# Patient Record
Sex: Male | Born: 1935 | Race: White | Hispanic: No | Marital: Single | Smoking: Former smoker
Health system: Southern US, Community
[De-identification: ages and names within clinical notes are randomized; demographics above are authoritative.]

## PROBLEM LIST (undated history)

## (undated) DIAGNOSIS — J449 Chronic obstructive pulmonary disease, unspecified: Secondary | ICD-10-CM

## (undated) DIAGNOSIS — F0390 Unspecified dementia without behavioral disturbance: Secondary | ICD-10-CM

## (undated) DIAGNOSIS — Z8711 Personal history of peptic ulcer disease: Secondary | ICD-10-CM

## (undated) DIAGNOSIS — I10 Essential (primary) hypertension: Secondary | ICD-10-CM

## (undated) DIAGNOSIS — D649 Anemia, unspecified: Secondary | ICD-10-CM

## (undated) DIAGNOSIS — Z8719 Personal history of other diseases of the digestive system: Secondary | ICD-10-CM

## (undated) DIAGNOSIS — K219 Gastro-esophageal reflux disease without esophagitis: Secondary | ICD-10-CM

## (undated) DIAGNOSIS — IMO0001 Reserved for inherently not codable concepts without codable children: Secondary | ICD-10-CM

## (undated) HISTORY — PX: GASTRECTOMY: SHX58

---

## 1997-07-07 ENCOUNTER — Encounter: Admission: RE | Admit: 1997-07-07 | Discharge: 1997-07-07 | Payer: Self-pay | Admitting: Sports Medicine

## 1997-07-12 ENCOUNTER — Encounter: Admission: RE | Admit: 1997-07-12 | Discharge: 1997-07-12 | Payer: Self-pay | Admitting: Family Medicine

## 1997-10-09 ENCOUNTER — Emergency Department (HOSPITAL_COMMUNITY): Admission: EM | Admit: 1997-10-09 | Discharge: 1997-10-09 | Payer: Self-pay

## 1997-10-10 ENCOUNTER — Encounter: Admission: RE | Admit: 1997-10-10 | Discharge: 1997-10-10 | Payer: Self-pay | Admitting: Family Medicine

## 1997-10-13 ENCOUNTER — Encounter: Admission: RE | Admit: 1997-10-13 | Discharge: 1997-10-13 | Payer: Self-pay | Admitting: Family Medicine

## 1997-10-17 ENCOUNTER — Encounter: Admission: RE | Admit: 1997-10-17 | Discharge: 1997-10-17 | Payer: Self-pay | Admitting: Family Medicine

## 1997-10-19 ENCOUNTER — Inpatient Hospital Stay (HOSPITAL_COMMUNITY): Admission: EM | Admit: 1997-10-19 | Discharge: 1997-11-24 | Payer: Self-pay | Admitting: Emergency Medicine

## 1997-10-25 ENCOUNTER — Encounter: Payer: Self-pay | Admitting: Critical Care Medicine

## 1997-10-25 ENCOUNTER — Encounter: Admission: RE | Admit: 1997-10-25 | Discharge: 1997-10-25 | Payer: Self-pay | Admitting: Family Medicine

## 1997-10-26 ENCOUNTER — Encounter: Payer: Self-pay | Admitting: Pulmonary Disease

## 1997-10-26 ENCOUNTER — Encounter: Payer: Self-pay | Admitting: Family Medicine

## 1997-10-27 ENCOUNTER — Encounter: Payer: Self-pay | Admitting: Pulmonary Disease

## 1997-10-28 ENCOUNTER — Encounter: Payer: Self-pay | Admitting: Family Medicine

## 1997-10-30 ENCOUNTER — Encounter: Payer: Self-pay | Admitting: Pulmonary Disease

## 1997-11-03 ENCOUNTER — Encounter: Payer: Self-pay | Admitting: Pulmonary Disease

## 1997-11-04 ENCOUNTER — Encounter: Payer: Self-pay | Admitting: Family Medicine

## 1997-11-09 ENCOUNTER — Encounter: Payer: Self-pay | Admitting: Gastroenterology

## 1997-11-15 ENCOUNTER — Encounter: Payer: Self-pay | Admitting: Family Medicine

## 1997-12-28 ENCOUNTER — Encounter: Admission: RE | Admit: 1997-12-28 | Discharge: 1997-12-28 | Payer: Self-pay | Admitting: Family Medicine

## 1998-03-29 ENCOUNTER — Encounter: Admission: RE | Admit: 1998-03-29 | Discharge: 1998-03-29 | Payer: Self-pay | Admitting: Family Medicine

## 1998-05-24 ENCOUNTER — Encounter: Admission: RE | Admit: 1998-05-24 | Discharge: 1998-05-24 | Payer: Self-pay | Admitting: Family Medicine

## 1998-09-06 ENCOUNTER — Encounter: Admission: RE | Admit: 1998-09-06 | Discharge: 1998-09-06 | Payer: Self-pay | Admitting: Family Medicine

## 1998-10-05 ENCOUNTER — Encounter: Admission: RE | Admit: 1998-10-05 | Discharge: 1998-10-05 | Payer: Self-pay | Admitting: Family Medicine

## 1998-10-10 ENCOUNTER — Encounter: Admission: RE | Admit: 1998-10-10 | Discharge: 1998-10-10 | Payer: Self-pay | Admitting: Family Medicine

## 1999-03-16 ENCOUNTER — Encounter: Admission: RE | Admit: 1999-03-16 | Discharge: 1999-03-16 | Payer: Self-pay | Admitting: Family Medicine

## 1999-03-19 ENCOUNTER — Encounter: Admission: RE | Admit: 1999-03-19 | Discharge: 1999-03-19 | Payer: Self-pay | Admitting: Family Medicine

## 1999-03-28 ENCOUNTER — Encounter: Admission: RE | Admit: 1999-03-28 | Discharge: 1999-03-28 | Payer: Self-pay | Admitting: Family Medicine

## 1999-03-30 ENCOUNTER — Encounter: Admission: RE | Admit: 1999-03-30 | Discharge: 1999-03-30 | Payer: Self-pay | Admitting: Family Medicine

## 1999-04-02 ENCOUNTER — Encounter: Admission: RE | Admit: 1999-04-02 | Discharge: 1999-04-02 | Payer: Self-pay | Admitting: Family Medicine

## 1999-10-04 ENCOUNTER — Encounter: Admission: RE | Admit: 1999-10-04 | Discharge: 1999-10-04 | Payer: Self-pay | Admitting: Family Medicine

## 2000-04-15 ENCOUNTER — Encounter: Admission: RE | Admit: 2000-04-15 | Discharge: 2000-04-15 | Payer: Self-pay | Admitting: Family Medicine

## 2000-12-04 ENCOUNTER — Encounter: Admission: RE | Admit: 2000-12-04 | Discharge: 2000-12-04 | Payer: Self-pay | Admitting: Family Medicine

## 2001-04-20 ENCOUNTER — Encounter: Admission: RE | Admit: 2001-04-20 | Discharge: 2001-04-20 | Payer: Self-pay | Admitting: Family Medicine

## 2002-05-27 ENCOUNTER — Encounter: Admission: RE | Admit: 2002-05-27 | Discharge: 2002-05-27 | Payer: Self-pay | Admitting: Family Medicine

## 2002-11-17 ENCOUNTER — Encounter: Admission: RE | Admit: 2002-11-17 | Discharge: 2002-11-17 | Payer: Self-pay | Admitting: Family Medicine

## 2003-03-30 ENCOUNTER — Encounter: Admission: RE | Admit: 2003-03-30 | Discharge: 2003-03-30 | Payer: Self-pay | Admitting: Family Medicine

## 2003-07-10 ENCOUNTER — Inpatient Hospital Stay (HOSPITAL_COMMUNITY): Admission: EM | Admit: 2003-07-10 | Discharge: 2003-07-14 | Payer: Self-pay | Admitting: Emergency Medicine

## 2003-07-27 ENCOUNTER — Encounter: Admission: RE | Admit: 2003-07-27 | Discharge: 2003-07-27 | Payer: Self-pay | Admitting: Family Medicine

## 2003-09-15 ENCOUNTER — Encounter: Admission: RE | Admit: 2003-09-15 | Discharge: 2003-09-15 | Payer: Self-pay | Admitting: Sports Medicine

## 2003-10-19 ENCOUNTER — Ambulatory Visit: Payer: Self-pay | Admitting: Family Medicine

## 2003-12-22 ENCOUNTER — Ambulatory Visit: Payer: Self-pay | Admitting: Family Medicine

## 2004-01-09 ENCOUNTER — Ambulatory Visit: Payer: Self-pay | Admitting: Sports Medicine

## 2004-02-29 ENCOUNTER — Ambulatory Visit: Payer: Self-pay | Admitting: Family Medicine

## 2004-11-12 ENCOUNTER — Ambulatory Visit: Payer: Self-pay | Admitting: Family Medicine

## 2005-02-22 ENCOUNTER — Ambulatory Visit: Payer: Self-pay | Admitting: Family Medicine

## 2005-03-22 ENCOUNTER — Ambulatory Visit: Payer: Self-pay | Admitting: Family Medicine

## 2005-05-22 ENCOUNTER — Encounter: Payer: Self-pay | Admitting: Family Medicine

## 2005-06-06 ENCOUNTER — Ambulatory Visit: Payer: Self-pay | Admitting: Family Medicine

## 2005-06-06 ENCOUNTER — Encounter: Admission: RE | Admit: 2005-06-06 | Discharge: 2005-06-06 | Payer: Self-pay | Admitting: Sports Medicine

## 2005-06-11 ENCOUNTER — Ambulatory Visit: Payer: Self-pay | Admitting: Sports Medicine

## 2005-08-30 ENCOUNTER — Ambulatory Visit: Payer: Self-pay | Admitting: Family Medicine

## 2005-10-14 ENCOUNTER — Emergency Department (HOSPITAL_COMMUNITY): Admission: EM | Admit: 2005-10-14 | Discharge: 2005-10-14 | Payer: Self-pay | Admitting: Emergency Medicine

## 2005-11-12 ENCOUNTER — Ambulatory Visit (HOSPITAL_COMMUNITY): Admission: RE | Admit: 2005-11-12 | Discharge: 2005-11-12 | Payer: Self-pay | Admitting: Family Medicine

## 2005-11-12 ENCOUNTER — Ambulatory Visit: Payer: Self-pay | Admitting: Family Medicine

## 2006-02-14 ENCOUNTER — Emergency Department (HOSPITAL_COMMUNITY): Admission: EM | Admit: 2006-02-14 | Discharge: 2006-02-14 | Payer: Self-pay | Admitting: Emergency Medicine

## 2006-02-21 ENCOUNTER — Ambulatory Visit: Payer: Self-pay | Admitting: Family Medicine

## 2006-02-21 ENCOUNTER — Encounter (INDEPENDENT_AMBULATORY_CARE_PROVIDER_SITE_OTHER): Payer: Self-pay | Admitting: Family Medicine

## 2006-02-21 LAB — CONVERTED CEMR LAB
Ferritin: 6 ng/mL — ABNORMAL LOW (ref 22–322)
Iron: 12 ug/dL — ABNORMAL LOW (ref 42–165)
Saturation Ratios: 4 % — ABNORMAL LOW (ref 20–55)

## 2006-02-27 ENCOUNTER — Encounter: Admission: RE | Admit: 2006-02-27 | Discharge: 2006-02-27 | Payer: Self-pay | Admitting: Gastroenterology

## 2006-03-20 DIAGNOSIS — K449 Diaphragmatic hernia without obstruction or gangrene: Secondary | ICD-10-CM | POA: Insufficient documentation

## 2006-03-20 DIAGNOSIS — J45909 Unspecified asthma, uncomplicated: Secondary | ICD-10-CM | POA: Insufficient documentation

## 2006-03-20 DIAGNOSIS — F29 Unspecified psychosis not due to a substance or known physiological condition: Secondary | ICD-10-CM | POA: Insufficient documentation

## 2006-03-20 DIAGNOSIS — K219 Gastro-esophageal reflux disease without esophagitis: Secondary | ICD-10-CM

## 2006-03-20 DIAGNOSIS — F79 Unspecified intellectual disabilities: Secondary | ICD-10-CM | POA: Insufficient documentation

## 2006-03-20 DIAGNOSIS — I10 Essential (primary) hypertension: Secondary | ICD-10-CM | POA: Insufficient documentation

## 2006-04-25 ENCOUNTER — Inpatient Hospital Stay (HOSPITAL_COMMUNITY): Admission: EM | Admit: 2006-04-25 | Discharge: 2006-04-28 | Payer: Self-pay | Admitting: Emergency Medicine

## 2006-04-25 ENCOUNTER — Telehealth: Payer: Self-pay | Admitting: *Deleted

## 2006-05-02 ENCOUNTER — Telehealth: Payer: Self-pay | Admitting: *Deleted

## 2006-05-05 ENCOUNTER — Encounter: Admission: RE | Admit: 2006-05-05 | Discharge: 2006-05-05 | Payer: Self-pay | Admitting: Sports Medicine

## 2006-05-05 ENCOUNTER — Encounter (INDEPENDENT_AMBULATORY_CARE_PROVIDER_SITE_OTHER): Payer: Self-pay | Admitting: Family Medicine

## 2006-05-05 ENCOUNTER — Ambulatory Visit: Payer: Self-pay | Admitting: Family Medicine

## 2006-05-05 ENCOUNTER — Ambulatory Visit (HOSPITAL_COMMUNITY): Admission: RE | Admit: 2006-05-05 | Discharge: 2006-05-05 | Payer: Self-pay | Admitting: Family Medicine

## 2006-05-05 DIAGNOSIS — E785 Hyperlipidemia, unspecified: Secondary | ICD-10-CM | POA: Insufficient documentation

## 2006-05-05 DIAGNOSIS — E876 Hypokalemia: Secondary | ICD-10-CM

## 2006-05-05 DIAGNOSIS — D509 Iron deficiency anemia, unspecified: Secondary | ICD-10-CM

## 2006-05-05 LAB — CONVERTED CEMR LAB
BUN: 13 mg/dL (ref 6–23)
CO2: 23 meq/L (ref 19–32)
Calcium: 9.3 mg/dL (ref 8.4–10.5)
Creatinine, Ser: 1.1 mg/dL (ref 0.40–1.50)
Glucose, Bld: 154 mg/dL — ABNORMAL HIGH (ref 70–99)

## 2006-05-06 ENCOUNTER — Telehealth (INDEPENDENT_AMBULATORY_CARE_PROVIDER_SITE_OTHER): Payer: Self-pay | Admitting: *Deleted

## 2006-05-06 ENCOUNTER — Telehealth (INDEPENDENT_AMBULATORY_CARE_PROVIDER_SITE_OTHER): Payer: Self-pay | Admitting: Family Medicine

## 2006-05-06 ENCOUNTER — Encounter (INDEPENDENT_AMBULATORY_CARE_PROVIDER_SITE_OTHER): Payer: Self-pay | Admitting: Family Medicine

## 2006-05-09 ENCOUNTER — Encounter (INDEPENDENT_AMBULATORY_CARE_PROVIDER_SITE_OTHER): Payer: Self-pay | Admitting: Family Medicine

## 2006-05-21 ENCOUNTER — Telehealth: Payer: Self-pay | Admitting: *Deleted

## 2006-06-04 ENCOUNTER — Encounter (INDEPENDENT_AMBULATORY_CARE_PROVIDER_SITE_OTHER): Payer: Self-pay | Admitting: Family Medicine

## 2006-09-16 ENCOUNTER — Ambulatory Visit: Payer: Self-pay | Admitting: Family Medicine

## 2006-09-16 ENCOUNTER — Observation Stay (HOSPITAL_COMMUNITY): Admission: EM | Admit: 2006-09-16 | Discharge: 2006-09-17 | Payer: Self-pay | Admitting: Emergency Medicine

## 2006-09-16 ENCOUNTER — Telehealth (INDEPENDENT_AMBULATORY_CARE_PROVIDER_SITE_OTHER): Payer: Self-pay | Admitting: *Deleted

## 2006-09-23 ENCOUNTER — Ambulatory Visit: Payer: Self-pay | Admitting: Internal Medicine

## 2006-09-23 ENCOUNTER — Encounter (INDEPENDENT_AMBULATORY_CARE_PROVIDER_SITE_OTHER): Payer: Self-pay | Admitting: Family Medicine

## 2006-09-23 LAB — CONVERTED CEMR LAB
BUN: 18 mg/dL (ref 6–23)
CO2: 23 meq/L (ref 19–32)
Creatinine, Ser: 1.06 mg/dL (ref 0.40–1.50)
RBC: 5.25 M/uL (ref 4.22–5.81)
WBC: 10.1 10*3/uL (ref 4.0–10.5)

## 2006-12-10 ENCOUNTER — Encounter: Payer: Self-pay | Admitting: *Deleted

## 2006-12-11 ENCOUNTER — Ambulatory Visit: Payer: Self-pay | Admitting: Family Medicine

## 2006-12-11 ENCOUNTER — Encounter: Admission: RE | Admit: 2006-12-11 | Discharge: 2006-12-11 | Payer: Self-pay | Admitting: Sports Medicine

## 2006-12-12 ENCOUNTER — Telehealth (INDEPENDENT_AMBULATORY_CARE_PROVIDER_SITE_OTHER): Payer: Self-pay | Admitting: Family Medicine

## 2007-01-27 ENCOUNTER — Telehealth: Payer: Self-pay | Admitting: *Deleted

## 2007-01-27 ENCOUNTER — Ambulatory Visit: Payer: Self-pay | Admitting: Family Medicine

## 2007-03-30 ENCOUNTER — Telehealth: Payer: Self-pay | Admitting: *Deleted

## 2007-04-01 ENCOUNTER — Encounter (INDEPENDENT_AMBULATORY_CARE_PROVIDER_SITE_OTHER): Payer: Self-pay | Admitting: Family Medicine

## 2007-04-01 ENCOUNTER — Ambulatory Visit: Payer: Self-pay | Admitting: Family Medicine

## 2007-04-01 DIAGNOSIS — J4489 Other specified chronic obstructive pulmonary disease: Secondary | ICD-10-CM | POA: Insufficient documentation

## 2007-04-01 DIAGNOSIS — J449 Chronic obstructive pulmonary disease, unspecified: Secondary | ICD-10-CM | POA: Insufficient documentation

## 2007-04-01 LAB — CONVERTED CEMR LAB
Calcium: 9.1 mg/dL (ref 8.4–10.5)
Chloride: 96 meq/L (ref 96–112)
Cholesterol: 163 mg/dL (ref 0–200)
Creatinine, Ser: 0.91 mg/dL (ref 0.40–1.50)
Glucose, Bld: 118 mg/dL — ABNORMAL HIGH (ref 70–99)
HCT: 46.7 % (ref 39.0–52.0)
HDL: 45 mg/dL (ref >39)
Hemoglobin: 15.3 g/dL (ref 13.0–17.0)
LDL Cholesterol: 99 mg/dL (ref 0–99)
RBC: 4.95 M/uL (ref 4.22–5.81)
RDW: 16.3 % — ABNORMAL HIGH (ref 11.5–15.5)
VLDL: 19 mg/dL (ref 0–40)

## 2007-04-10 LAB — CONVERTED CEMR LAB

## 2007-04-16 ENCOUNTER — Ambulatory Visit: Payer: Self-pay | Admitting: Family Medicine

## 2007-06-25 ENCOUNTER — Telehealth (INDEPENDENT_AMBULATORY_CARE_PROVIDER_SITE_OTHER): Payer: Self-pay | Admitting: Family Medicine

## 2007-07-14 ENCOUNTER — Encounter: Payer: Self-pay | Admitting: Family Medicine

## 2007-07-16 ENCOUNTER — Ambulatory Visit: Payer: Self-pay | Admitting: Family Medicine

## 2007-07-30 ENCOUNTER — Ambulatory Visit: Payer: Self-pay | Admitting: Family Medicine

## 2007-07-30 ENCOUNTER — Telehealth: Payer: Self-pay | Admitting: *Deleted

## 2007-08-05 ENCOUNTER — Telehealth: Payer: Self-pay | Admitting: *Deleted

## 2007-08-05 ENCOUNTER — Ambulatory Visit: Payer: Self-pay | Admitting: Family Medicine

## 2007-08-05 ENCOUNTER — Inpatient Hospital Stay (HOSPITAL_COMMUNITY): Admission: AD | Admit: 2007-08-05 | Discharge: 2007-08-07 | Payer: Self-pay | Admitting: Family Medicine

## 2007-08-05 DIAGNOSIS — K922 Gastrointestinal hemorrhage, unspecified: Secondary | ICD-10-CM | POA: Insufficient documentation

## 2007-08-06 ENCOUNTER — Encounter (INDEPENDENT_AMBULATORY_CARE_PROVIDER_SITE_OTHER): Payer: Self-pay | Admitting: Gastroenterology

## 2007-08-17 ENCOUNTER — Ambulatory Visit: Payer: Self-pay | Admitting: Family Medicine

## 2007-08-17 ENCOUNTER — Encounter: Payer: Self-pay | Admitting: Family Medicine

## 2007-08-17 DIAGNOSIS — K2270 Barrett's esophagus without dysplasia: Secondary | ICD-10-CM

## 2007-08-17 DIAGNOSIS — K2211 Ulcer of esophagus with bleeding: Secondary | ICD-10-CM

## 2007-08-17 LAB — CONVERTED CEMR LAB
CO2: 28 meq/L (ref 19–32)
Calcium: 9.8 mg/dL (ref 8.4–10.5)
Chloride: 93 meq/L — ABNORMAL LOW (ref 96–112)
Creatinine, Ser: 0.88 mg/dL (ref 0.40–1.50)
Potassium: 3.6 meq/L (ref 3.5–5.3)

## 2007-08-18 ENCOUNTER — Encounter: Payer: Self-pay | Admitting: Family Medicine

## 2007-08-18 ENCOUNTER — Telehealth: Payer: Self-pay | Admitting: *Deleted

## 2007-09-16 ENCOUNTER — Encounter: Payer: Self-pay | Admitting: Family Medicine

## 2007-09-16 LAB — CONVERTED CEMR LAB
Basophils Relative: 1 %
Eosinophils Relative: 16 %
Hemoglobin: 11.1 g/dL
MCV: 89.5 fL
Neutrophils Relative %: 42.5 %
Platelets: 500 10*3/uL
RDW: 16.3 %
WBC: 15.5 10*3/uL

## 2007-09-23 ENCOUNTER — Encounter: Payer: Self-pay | Admitting: Family Medicine

## 2007-10-06 ENCOUNTER — Telehealth: Payer: Self-pay | Admitting: *Deleted

## 2007-10-06 ENCOUNTER — Ambulatory Visit: Payer: Self-pay | Admitting: Family Medicine

## 2007-10-06 ENCOUNTER — Encounter: Admission: RE | Admit: 2007-10-06 | Discharge: 2007-10-06 | Payer: Self-pay | Admitting: Family Medicine

## 2007-11-10 ENCOUNTER — Telehealth (INDEPENDENT_AMBULATORY_CARE_PROVIDER_SITE_OTHER): Payer: Self-pay | Admitting: Family Medicine

## 2007-11-11 ENCOUNTER — Ambulatory Visit: Payer: Self-pay | Admitting: Family Medicine

## 2007-11-11 ENCOUNTER — Encounter: Payer: Self-pay | Admitting: Family Medicine

## 2007-11-23 ENCOUNTER — Encounter: Payer: Self-pay | Admitting: Family Medicine

## 2007-11-24 ENCOUNTER — Ambulatory Visit: Payer: Self-pay | Admitting: Family Medicine

## 2007-11-25 ENCOUNTER — Telehealth: Payer: Self-pay | Admitting: Family Medicine

## 2007-12-15 ENCOUNTER — Telehealth (INDEPENDENT_AMBULATORY_CARE_PROVIDER_SITE_OTHER): Payer: Self-pay | Admitting: *Deleted

## 2007-12-15 ENCOUNTER — Ambulatory Visit: Payer: Self-pay | Admitting: Family Medicine

## 2007-12-16 ENCOUNTER — Telehealth: Payer: Self-pay | Admitting: Family Medicine

## 2007-12-16 ENCOUNTER — Ambulatory Visit: Payer: Self-pay | Admitting: Family Medicine

## 2007-12-16 ENCOUNTER — Encounter: Payer: Self-pay | Admitting: Family Medicine

## 2007-12-16 LAB — CONVERTED CEMR LAB
AST: 25 units/L (ref 0–37)
Albumin: 4.3 g/dL (ref 3.5–5.2)
Alkaline Phosphatase: 94 units/L (ref 39–117)
BUN: 12 mg/dL (ref 6–23)
Potassium: 4.3 meq/L (ref 3.5–5.3)
Sodium: 135 meq/L (ref 135–145)
Total Bilirubin: 0.4 mg/dL (ref 0.3–1.2)
Total Protein: 8.1 g/dL (ref 6.0–8.3)

## 2007-12-30 ENCOUNTER — Ambulatory Visit: Payer: Self-pay | Admitting: Family Medicine

## 2008-01-25 ENCOUNTER — Telehealth: Payer: Self-pay | Admitting: *Deleted

## 2008-01-26 ENCOUNTER — Ambulatory Visit: Payer: Self-pay | Admitting: Family Medicine

## 2008-01-26 ENCOUNTER — Ambulatory Visit (HOSPITAL_COMMUNITY): Admission: RE | Admit: 2008-01-26 | Discharge: 2008-01-26 | Payer: Self-pay | Admitting: Family Medicine

## 2008-01-26 ENCOUNTER — Encounter (INDEPENDENT_AMBULATORY_CARE_PROVIDER_SITE_OTHER): Payer: Self-pay | Admitting: Family Medicine

## 2008-01-26 LAB — CONVERTED CEMR LAB
Alkaline Phosphatase: 98 units/L (ref 39–117)
CO2: 28 meq/L (ref 19–32)
Creatinine, Ser: 0.9 mg/dL (ref 0.40–1.50)
Glucose, Bld: 103 mg/dL — ABNORMAL HIGH (ref 70–99)
HCT: 47 % (ref 39.0–52.0)
MCHC: 31.9 g/dL (ref 30.0–36.0)
MCV: 90.2 fL (ref 78.0–100.0)
RBC: 5.21 M/uL (ref 4.22–5.81)
Sodium: 139 meq/L (ref 135–145)
Total Bilirubin: 0.5 mg/dL (ref 0.3–1.2)
Total Protein: 8.5 g/dL — ABNORMAL HIGH (ref 6.0–8.3)

## 2008-01-27 ENCOUNTER — Ambulatory Visit: Payer: Self-pay | Admitting: Cardiology

## 2008-01-28 ENCOUNTER — Telehealth: Payer: Self-pay | Admitting: Family Medicine

## 2008-01-28 ENCOUNTER — Encounter: Payer: Self-pay | Admitting: Family Medicine

## 2008-02-01 ENCOUNTER — Ambulatory Visit: Payer: Self-pay | Admitting: Family Medicine

## 2008-02-01 ENCOUNTER — Encounter: Payer: Self-pay | Admitting: Family Medicine

## 2008-02-02 ENCOUNTER — Inpatient Hospital Stay (HOSPITAL_COMMUNITY): Admission: EM | Admit: 2008-02-02 | Discharge: 2008-02-04 | Payer: Self-pay | Admitting: Emergency Medicine

## 2008-02-02 ENCOUNTER — Encounter: Payer: Self-pay | Admitting: Family Medicine

## 2008-02-05 ENCOUNTER — Encounter: Payer: Self-pay | Admitting: Family Medicine

## 2008-02-05 ENCOUNTER — Telehealth: Payer: Self-pay | Admitting: Family Medicine

## 2008-02-09 ENCOUNTER — Telehealth: Payer: Self-pay | Admitting: *Deleted

## 2008-02-11 ENCOUNTER — Ambulatory Visit: Payer: Self-pay | Admitting: Family Medicine

## 2008-02-18 ENCOUNTER — Encounter: Payer: Self-pay | Admitting: Family Medicine

## 2008-03-07 ENCOUNTER — Telehealth: Payer: Self-pay | Admitting: Family Medicine

## 2008-03-29 ENCOUNTER — Encounter (INDEPENDENT_AMBULATORY_CARE_PROVIDER_SITE_OTHER): Payer: Self-pay | Admitting: Gastroenterology

## 2008-03-29 ENCOUNTER — Encounter: Payer: Self-pay | Admitting: Family Medicine

## 2008-03-29 ENCOUNTER — Ambulatory Visit (HOSPITAL_COMMUNITY): Admission: RE | Admit: 2008-03-29 | Discharge: 2008-03-29 | Payer: Self-pay | Admitting: Gastroenterology

## 2008-04-05 ENCOUNTER — Telehealth: Payer: Self-pay | Admitting: Family Medicine

## 2008-04-05 ENCOUNTER — Ambulatory Visit: Payer: Self-pay | Admitting: Family Medicine

## 2008-04-05 ENCOUNTER — Emergency Department (HOSPITAL_COMMUNITY): Admission: EM | Admit: 2008-04-05 | Discharge: 2008-04-05 | Payer: Self-pay | Admitting: Emergency Medicine

## 2008-04-06 ENCOUNTER — Encounter: Payer: Self-pay | Admitting: *Deleted

## 2008-04-06 ENCOUNTER — Encounter: Payer: Self-pay | Admitting: Family Medicine

## 2008-04-06 ENCOUNTER — Encounter (INDEPENDENT_AMBULATORY_CARE_PROVIDER_SITE_OTHER): Payer: Self-pay | Admitting: *Deleted

## 2008-04-06 ENCOUNTER — Ambulatory Visit: Payer: Self-pay | Admitting: Family Medicine

## 2008-04-06 DIAGNOSIS — K8020 Calculus of gallbladder without cholecystitis without obstruction: Secondary | ICD-10-CM | POA: Insufficient documentation

## 2008-04-06 LAB — CONVERTED CEMR LAB
Alkaline Phosphatase: 73 units/L (ref 39–117)
BUN: 12 mg/dL (ref 6–23)
CO2: 26 meq/L (ref 19–32)
Creatinine, Ser: 1.02 mg/dL (ref 0.40–1.50)
Glucose, Bld: 103 mg/dL — ABNORMAL HIGH (ref 70–99)
HCT: 36.7 % — ABNORMAL LOW (ref 39.0–52.0)
Hemoglobin: 11.5 g/dL — ABNORMAL LOW (ref 13.0–17.0)
MCHC: 31.3 g/dL (ref 30.0–36.0)
MCV: 91.8 fL (ref 78.0–100.0)
RBC: 4 M/uL — ABNORMAL LOW (ref 4.22–5.81)
Sodium: 139 meq/L (ref 135–145)
Total Bilirubin: 0.3 mg/dL (ref 0.3–1.2)
WBC: 14 10*3/uL — ABNORMAL HIGH (ref 4.0–10.5)

## 2008-04-12 ENCOUNTER — Encounter: Payer: Self-pay | Admitting: Family Medicine

## 2008-04-15 ENCOUNTER — Encounter: Payer: Self-pay | Admitting: Family Medicine

## 2008-04-27 ENCOUNTER — Emergency Department (HOSPITAL_COMMUNITY): Admission: EM | Admit: 2008-04-27 | Discharge: 2008-04-27 | Payer: Self-pay | Admitting: Emergency Medicine

## 2008-05-03 ENCOUNTER — Encounter: Payer: Self-pay | Admitting: Family Medicine

## 2008-05-04 ENCOUNTER — Encounter: Payer: Self-pay | Admitting: Family Medicine

## 2008-06-14 ENCOUNTER — Telehealth: Payer: Self-pay | Admitting: Family Medicine

## 2008-07-05 ENCOUNTER — Ambulatory Visit: Payer: Self-pay | Admitting: Family Medicine

## 2008-07-05 ENCOUNTER — Encounter: Payer: Self-pay | Admitting: Family Medicine

## 2008-07-05 ENCOUNTER — Telehealth (INDEPENDENT_AMBULATORY_CARE_PROVIDER_SITE_OTHER): Payer: Self-pay | Admitting: *Deleted

## 2008-07-05 LAB — CONVERTED CEMR LAB: PSA: 0.49 ng/mL (ref 0.10–4.00)

## 2008-07-15 ENCOUNTER — Emergency Department (HOSPITAL_COMMUNITY): Admission: EM | Admit: 2008-07-15 | Discharge: 2008-07-15 | Payer: Self-pay | Admitting: Family Medicine

## 2008-11-04 ENCOUNTER — Ambulatory Visit: Payer: Self-pay | Admitting: Family Medicine

## 2008-11-04 ENCOUNTER — Telehealth: Payer: Self-pay | Admitting: Family Medicine

## 2008-12-14 ENCOUNTER — Telehealth: Payer: Self-pay | Admitting: Family Medicine

## 2008-12-14 ENCOUNTER — Encounter: Payer: Self-pay | Admitting: Family Medicine

## 2008-12-14 ENCOUNTER — Ambulatory Visit: Payer: Self-pay | Admitting: Family Medicine

## 2008-12-21 ENCOUNTER — Telehealth: Payer: Self-pay | Admitting: Family Medicine

## 2008-12-22 ENCOUNTER — Encounter: Payer: Self-pay | Admitting: Family Medicine

## 2008-12-22 ENCOUNTER — Ambulatory Visit: Payer: Self-pay | Admitting: Family Medicine

## 2008-12-28 ENCOUNTER — Telehealth: Payer: Self-pay | Admitting: Family Medicine

## 2009-01-27 ENCOUNTER — Encounter: Payer: Self-pay | Admitting: Family Medicine

## 2009-01-27 IMAGING — RF DG ESOPHAGUS
14 of 17 series · 14 of 17 positions shown · non-contrast
Comparison: None

CLINICAL DATA: Esophageal ulcer

BARIUM SWALLOW / ESOPHAGRAM

[Series 1: run · 1 of 1 slices shown (1 of 14)]
[im 1/1]
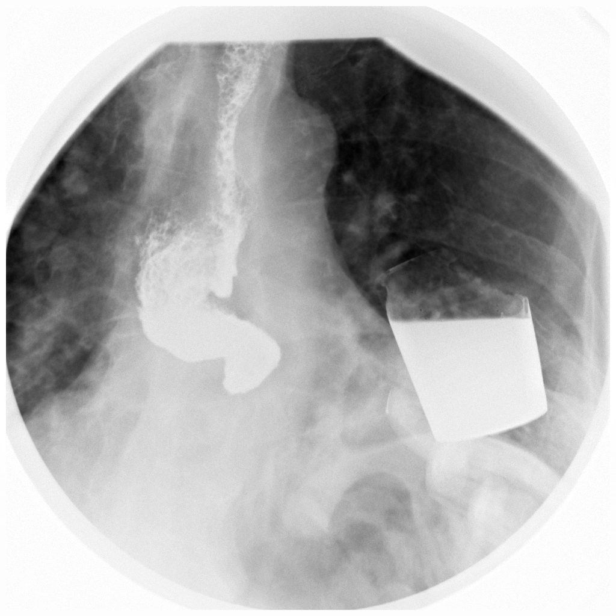

[Series 2: run · 1 of 1 slices shown (2 of 14)]
[im 1/1]
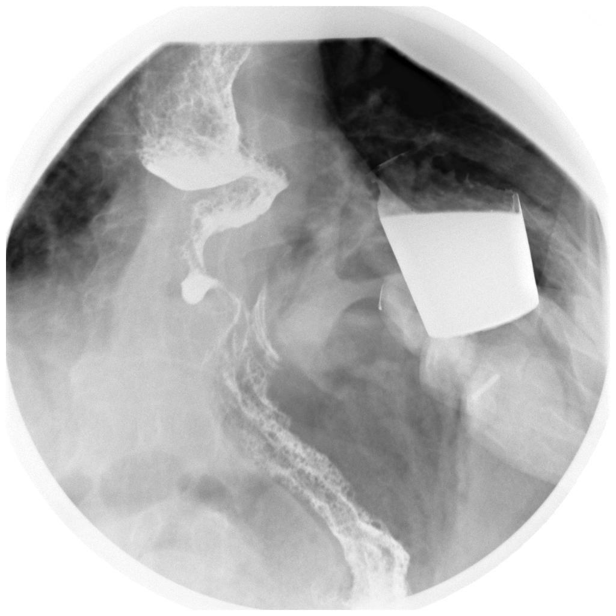

[Series 4: run · 1 of 1 slices shown (3 of 14)]
[im 1/1]
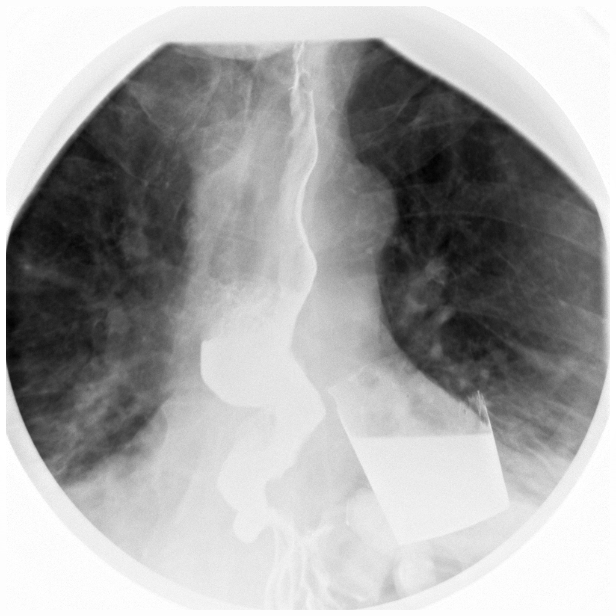

[Series 5: run · 1 of 1 slices shown (4 of 14)]
[im 1/1]
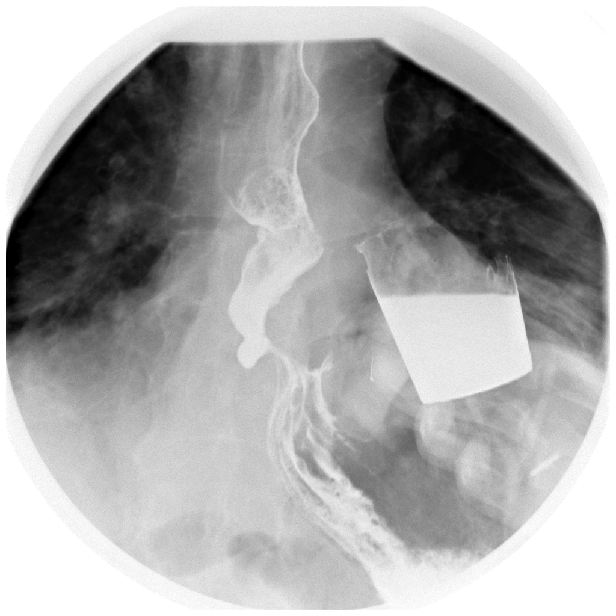

[Series 6: run · 1 of 1 slices shown (5 of 14)]
[im 1/1]
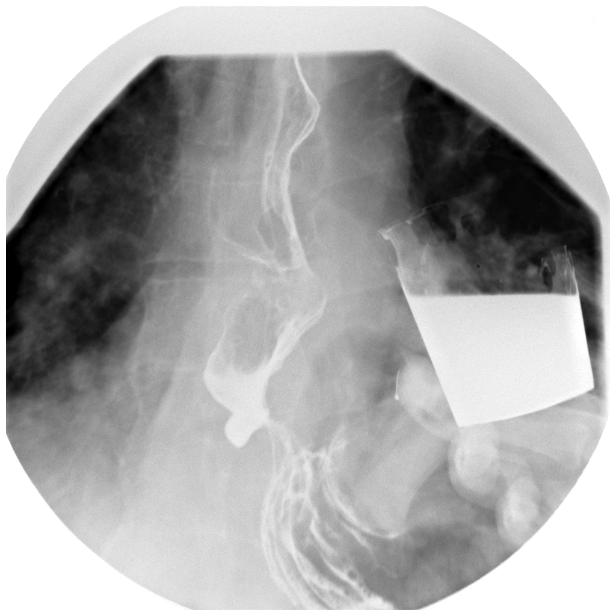

[Series 7: run · 1 of 1 slices shown (6 of 14)]
[im 1/1]
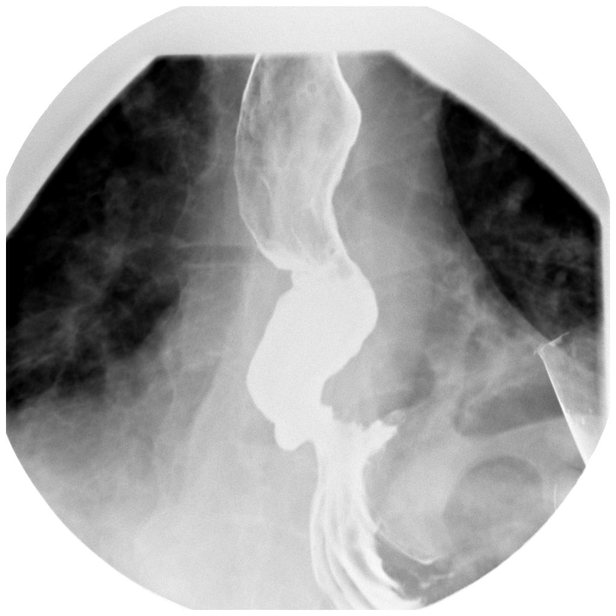

[Series 8: run · 1 of 1 slices shown (7 of 14)]
[im 1/1]
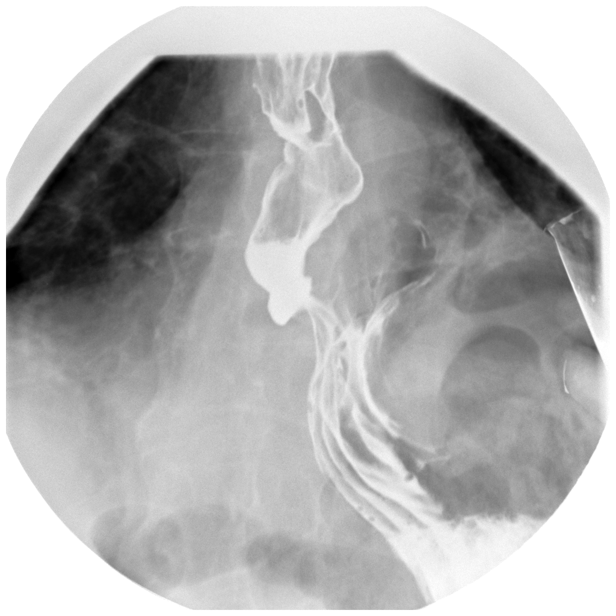

[Series 10: run · 1 of 1 slices shown (8 of 14)]
[im 1/1]
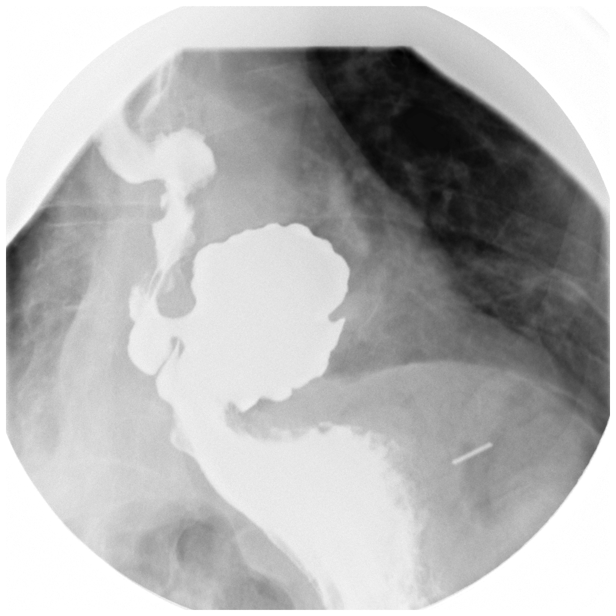

[Series 11: run · 1 of 1 slices shown (9 of 14)]
[im 1/1]
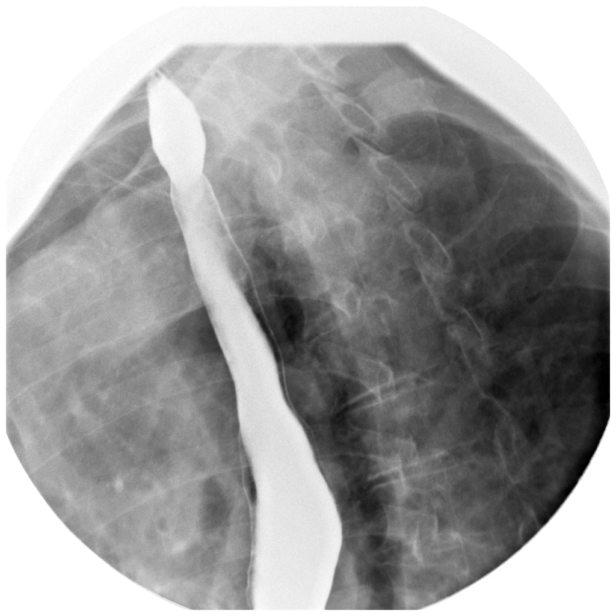

[Series 12: run · 1 of 1 slices shown (10 of 14)]
[im 1/1]
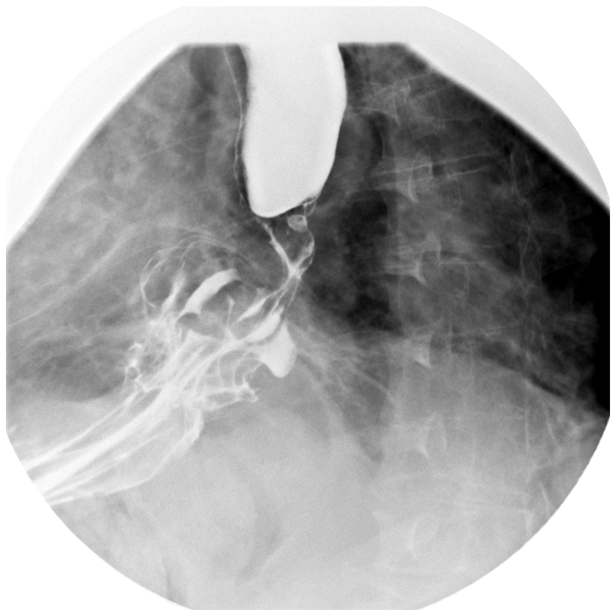

[Series 13: run · 1 of 1 slices shown (11 of 14)]
[im 1/1]
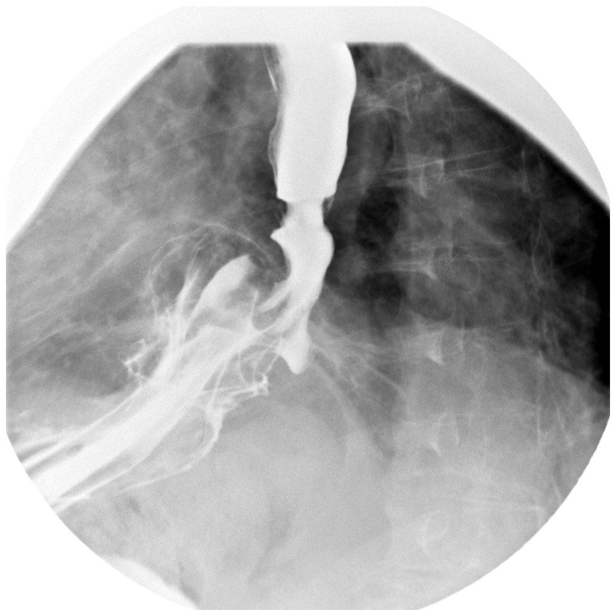

[Series 14: run · 1 of 1 slices shown (12 of 14)]
[im 1/1]
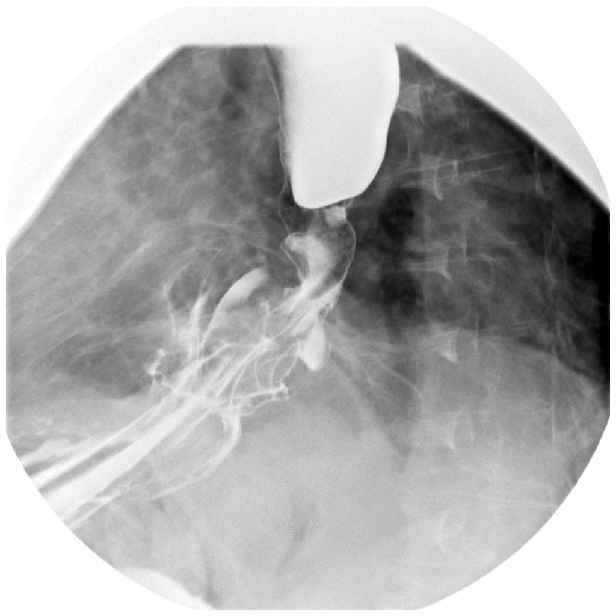

[Series 16: run · 1 of 1 slices shown (13 of 14)]
[im 1/1]
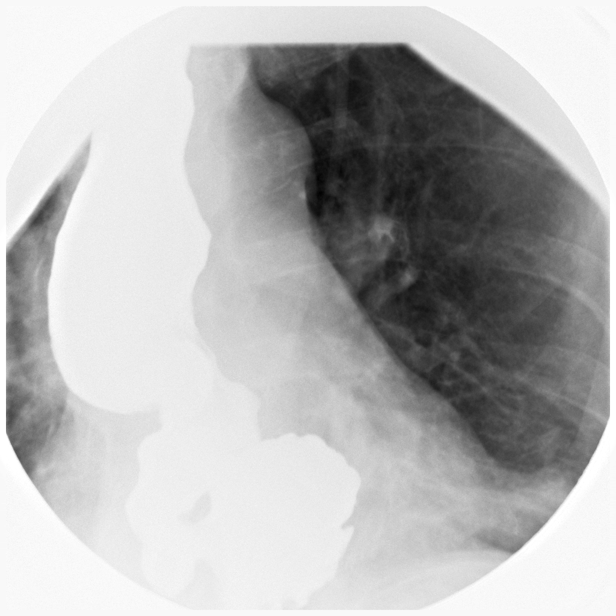

[Series 17: run · 1 of 1 slices shown (14 of 14)]
[im 1/1]
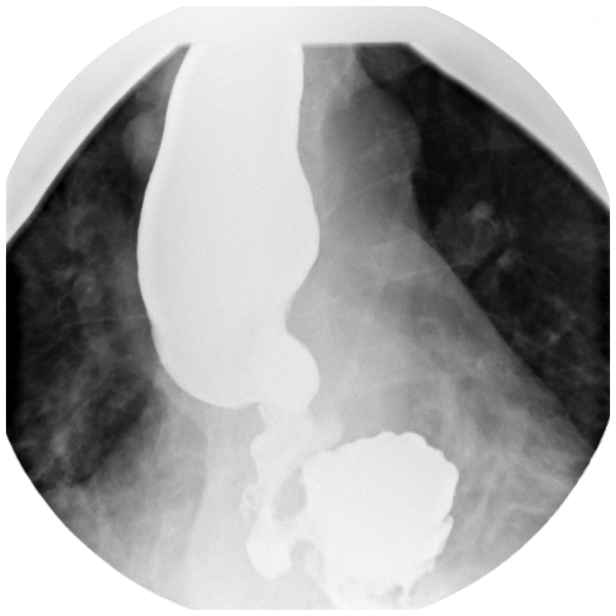

[14 of 17 positions shown; findings below may reference images not displayed]

FINDINGS: The study is limited as the patient could not retained
the gas from the effervescent granules.  A tortuous esophagus with
mild esophageal dysmotility is noted.  There is a stricture noted
in the distal esophagus.  A probable ulceration is noted above the
stricture .  There is mild to moderate sized paraesophageal hernia
is noted.  The esophagus above the stricture is capacious.  However
the lumen at stricture level is still patent without significant
delay in esophageal emptying.  A smaller probable ulceration is
noted at the level of the stricture.  Abundant spontaneous
gastroesophageal reflux was noted to the level of the upper
esophagus.  A barium tablet was given to the patient.  The tablet
passed through the stricture and progressed into the stomach
without any difficulty.
IMPRESSION: There is a tortuous esophagus with mild esophageal dysmotility.  A
stricture is noted in distal esophagus.  Two probable ulcerations
are noted in distal esophagus.  A small to moderate sized
paraesophageal hernia is noted.  Abundant spontaneous
gastroesophageal reflux was noted to the level of the upper
esophagus.  Limited study as described above. Please correlate with
endoscopy results. Fluoroscopy time was 3.1 minutes.

## 2009-02-01 ENCOUNTER — Emergency Department (HOSPITAL_COMMUNITY): Admission: EM | Admit: 2009-02-01 | Discharge: 2009-02-02 | Payer: Self-pay | Admitting: Emergency Medicine

## 2009-02-02 ENCOUNTER — Telehealth: Payer: Self-pay | Admitting: Family Medicine

## 2009-02-02 ENCOUNTER — Encounter: Payer: Self-pay | Admitting: Family Medicine

## 2009-02-07 ENCOUNTER — Ambulatory Visit: Payer: Self-pay | Admitting: Family Medicine

## 2009-02-07 DIAGNOSIS — G510 Bell's palsy: Secondary | ICD-10-CM | POA: Insufficient documentation

## 2009-02-08 ENCOUNTER — Telehealth: Payer: Self-pay | Admitting: Family Medicine

## 2009-02-14 ENCOUNTER — Encounter: Payer: Self-pay | Admitting: Family Medicine

## 2009-02-14 ENCOUNTER — Ambulatory Visit: Payer: Self-pay | Admitting: Family Medicine

## 2009-03-03 ENCOUNTER — Emergency Department (HOSPITAL_COMMUNITY): Admission: EM | Admit: 2009-03-03 | Discharge: 2009-03-03 | Payer: Self-pay | Admitting: Family Medicine

## 2009-07-07 DIAGNOSIS — Z87891 Personal history of nicotine dependence: Secondary | ICD-10-CM | POA: Insufficient documentation

## 2009-07-18 ENCOUNTER — Encounter: Payer: Self-pay | Admitting: Family Medicine

## 2009-07-24 IMAGING — CR DG CHEST 1V PORT
1 series · 1 of 1 positions shown · non-contrast
Comparison: 10/06/2007

CLINICAL DATA: Vomiting blood.  Chest pain.

PORTABLE CHEST - 1 VIEW

[view not recorded]
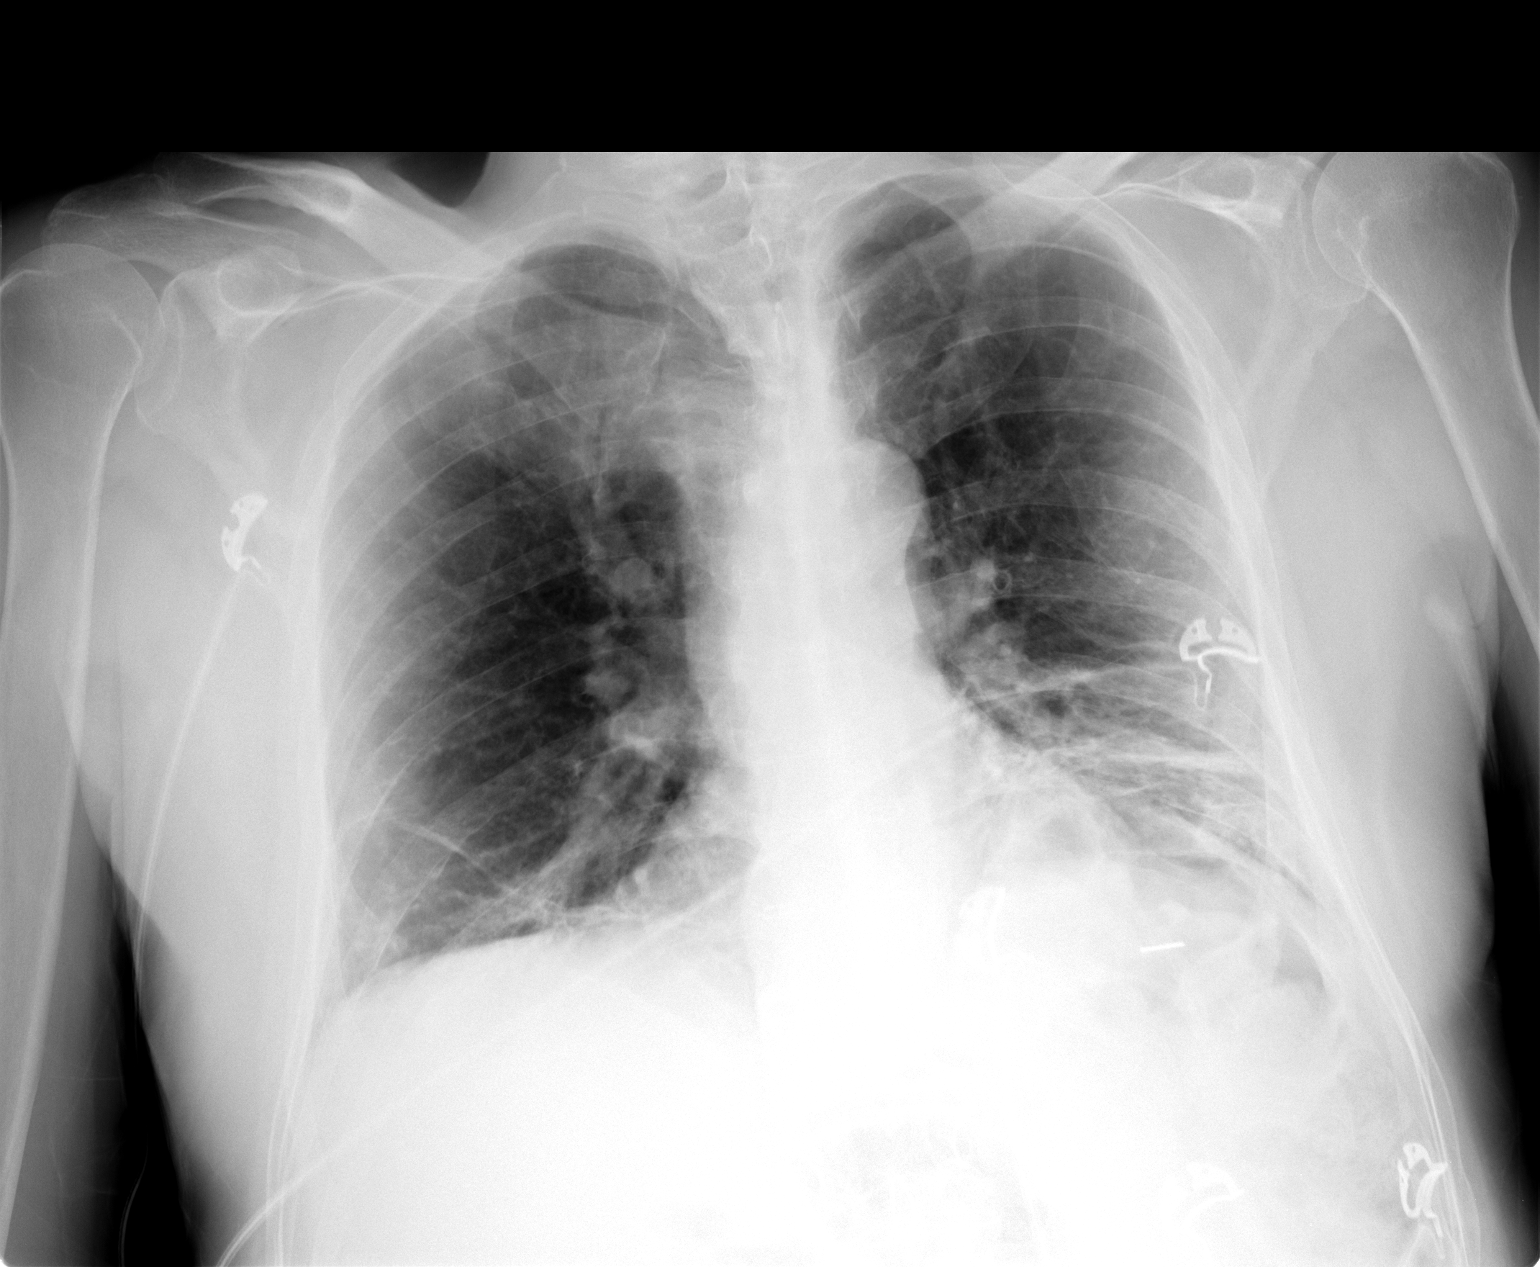

[1 of 1 positions shown; findings below may reference images not displayed]

FINDINGS: Artifact overlies chest.  Heart size is normal.  The
patient has chronic lung disease with widespread pulmonary
scarring.  There are accentuated markings in the left lower lobe
that could be due to aspiration and/or pneumonia.  No pleural
effusion.
IMPRESSION: Chronic lung disease, possibly with superimposed active process in
the left lower lobe consistent with aspiration or pneumonia.

## 2009-11-13 ENCOUNTER — Encounter: Payer: Self-pay | Admitting: Family Medicine

## 2009-11-24 ENCOUNTER — Telehealth: Payer: Self-pay | Admitting: Family Medicine

## 2009-11-29 ENCOUNTER — Telehealth: Payer: Self-pay | Admitting: Family Medicine

## 2009-12-01 ENCOUNTER — Ambulatory Visit: Payer: Self-pay | Admitting: Family Medicine

## 2009-12-01 DIAGNOSIS — L259 Unspecified contact dermatitis, unspecified cause: Secondary | ICD-10-CM

## 2009-12-20 ENCOUNTER — Telehealth: Payer: Self-pay | Admitting: *Deleted

## 2009-12-21 ENCOUNTER — Encounter: Payer: Self-pay | Admitting: Family Medicine

## 2010-02-16 ENCOUNTER — Encounter: Payer: Self-pay | Admitting: Family Medicine

## 2010-02-20 NOTE — Progress Notes (Signed)
Summary: needs rx  Medications Added ARTIFICIAL TEARS  SOLN (ARTIFICIAL TEAR SOLUTION) 2 gtt in L eye three times a day until eye condition resolves. disp qs 1 month       Faxed to [facility]Gladstone Pih  February 02, 2009 4:49 PM  Phone Note From Other Clinic Call back at [phone]   Caller: [facility] Summary of Call: needs orders for him to use eye drops for his eye (newly diagnosed w/ [facility]) [facility], [place] [phone] fax [phone] Initial call taken by: De Nurse,  February 02, 2009 11:08 AM  Follow-up for Phone Call        sent to PCP Follow-up by: Gladstone Pih,  February 02, 2009 11:16 AM  Additional Follow-up for Phone Call Additional follow up Details #1::        i didnt diagnose. 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 he was there yesterday. they are going to fax the OV notes here Additional Follow-up by: Golden Circle RN,  February 02, 2009 2:14 PM  New/Updated Medications: ARTIFICIAL TEARS  SOLN (ARTIFICIAL TEAR SOLUTION) 2 gtt in L eye three times a day until eye condition resolves. disp qs 1 month OV notes from [facility] rec'd & placed in pcp chart box.Gladstone Pih  February 02, 2009 2:53 PM  there is no mention of eye drops in the visit notes. not sure what to tell them.  Lequita Asal  MD  February 02, 2009 3:43 PM  spoke with Thayer Ohm one of his caregivers, who then put me thru to [place], one of the agency's nurses.  he states on the discharge instructions pt was told to use otc eye drops to prevent eyes drying out. he will fax this here & I will put in pcp chart box. they need written orders on everything, including otc meds.Gladstone Pih  February 02, 2009 4:05 PM  rx sent and order to follow (see next document).  Lequita Asal  MD  February 02, 2009 4:20 PM  Prescriptions: ARTIFICIAL TEARS  SOLN (ARTIFICIAL TEAR SOLUTION) 2 gtt in L eye three times a day until eye condition resolves. disp qs 1 month  #1 x 4   Entered and Authorized by:   Lequita Asal  MD   Signed by:   Lequita Asal  MD on 02/02/2009   Method used:   Electronically to        [facility]* (retail)       [address]       [place], [place]  [address]       Ph: [phone]       Fax: [phone]   RxID:   [id number]

## 2010-02-20 NOTE — Assessment & Plan Note (Signed)
Summary: leg pain,df   Vital Signs:  Patient profile:   76 year old male Weight:      165.2 pounds Temp:     97.6 degrees F oral Pulse rate:   89 / minute Pulse rhythm:   regular BP sitting:   135 / 82  (left arm) Cuff size:   regular  Vitals Entered By: Loralee Pacas CMA (February 14, 2009 10:57 AM)  Primary Care Provider:  Lequita Asal  MD  CC:  leg and back pain.  History of Present Illness: 76 y/o mentally retarded male with schizophrenia here for eval of back and leg pain  leg pain- bilateral. was present 5 days ago. patient unable to quantify or qualify now. pain now gone.   back pain- patient fell several days ago, and since has been complaining of lower back pain. no noted incontinence of bowel or bladder. no difficulty walking. no pain traveling down his legs.   Current Medications (verified): 1)  Trazodone Hcl 50 Mg  Tabs (Trazodone Hcl) .Marland Kitchen.. 1 Tab By Mouth At Bedtime 2)  Benztropine Mesylate 1 Mg Tabs (Benztropine Mesylate) .Marland Kitchen.. 1 By Mouth At Bedtime 3)  Thioridazine Hcl 50 Mg  Tabs (Thioridazine Hcl) .Marland Kitchen.. 1 Tab By Mouth  Qam, 2 Tabs By Mouth Qhs 4)  Combivent 103-18 Mcg/act Aero (Albuterol-Ipratropium) .... Inhale 2 Puff Using Inhaler Twice A Day 5)  Colace 100 Mg Caps (Docusate Sodium) .... One Tab By Mouth Two Times A Day Scheduled 6)  Miralax   Powd (Polyethylene Glycol 3350) .Marland KitchenMarland KitchenMarland Kitchen 17 Gm in 4-8 Oz of Juice or Water By Mouth Two Times A Day Only As Needed For Constipation. May Hold For Loose Stools. Dispense 527 Grams. 7)  Dyazide 37.5-25 Mg Caps (Triamterene-Hctz) .Marland Kitchen.. 1 Capsule By Mouth Once A Day 8)  Zocor 20 Mg Tabs (Simvastatin) .... Take 1 Tablet By Mouth Every Night 9)  Eucerin  Crea (Skin Protectants, Misc.) .... Apply Liberally To Skin Twice A Day. Dispense 1 Large Jar 10)  Klor-Con 20 Meq  Pack (Potassium Chloride) .Marland Kitchen.. 1 Tab By Mouth Daily 11)  Ferrous Sulfate 325 (65 Fe) Mg Tabs (Ferrous Sulfate) .... Take 1 Tablet By Daily 12)  Nexium 40 Mg  Cpdr  (Esomeprazole Magnesium) .... One Tab By Mouth Two Times A Day For Esophageal Ulcers/barretts Esophagus 13)  Flovent Hfa 220 Mcg/act Aero (Fluticasone Propionate  Hfa) .... One Puff Bid 14)  Proair Hfa 108 (90 Base) Mcg/act Aers (Albuterol Sulfate) .... 2 Puffs Every 4 Hours As Needed For Shortness of Breath/wheezing.  Use With Spacer 15)  Naftin 1 % Crea (Naftifine Hcl) .... Apply To Affected Areas Once Daily. Disp 60 G Tube. 16)  Carafate 1 Gm/38ml Susp (Sucralfate) .... 2 Teaspoonfuls (1 Gm) By Mouth Qid. Disp Qs 40month. 17)  Artificial Tears  Soln (Artificial Tear Solution) .... 2 Gtt in L Eye Three Times A Day Until Eye Condition Resolves. Disp Qs 1 Month  Allergies (verified): 1)  ! Penicillin  Physical Exam  General:  alert, well-developed, well-nourished, and well-hydrated.  NAD. vitals reviewed.  Msk:  TTP of R lower back. negative straight leg raise bilaterally.  Extremities:  No clubbing, cyanosis, edema, or deformity noted with normal full range of motion of all joints.     Impression & Recommendations:  Problem # 1:  BACK PAIN (ICD-724.5) Assessment New  likely MSK pain 2/2 fall. no red flags on exam. unable to tolerate NSAIDs. already getting tylenol. will add ultram for 4 weeks.  Orders: [facility]- Est Level  3 ([address])  His updated medication list for this problem includes:    Ultram 50 Mg Tabs (Tramadol hcl) ..... One tab by mouth q6 hours as needed for pain x4 weeks  Problem # 2:  LEG PAIN (ICD-729.5) Assessment: New  no longer present. no further work up for now.   Orders: [facility]- Est Level  3 (60[address])  Medications Added to Medication List This Visit: 1)  Ultram 50 Mg Tabs (Tramadol hcl) .... One tab by mouth q6 hours as needed for pain x4 weeks Prescriptions: ULTRAM 50 MG TABS (TRAMADOL HCL) one tab by mouth q6 hours as needed for pain x4 weeks  #90 x 0   Entered and Authorized by:   Lequita Asal  MD   Signed by:   Lequita Asal  MD on 02/14/2009   Method  used:   Electronically to        [facility]* (retail)       [address]       [place], [place]  [address]       Ph: [phone]       Fax: [phone]   RxID:   [id number]

## 2010-02-20 NOTE — Progress Notes (Signed)
Summary: refill   Phone Note Refill Request Call back at [phone] Message from:  Patient  Refills Requested: Medication #1:  CARAFATE 1 GM/10ML SUSP 2 teaspoonfuls (1 gm) by mouth QID. disp QS 20month. Initial call taken by: De Nurse,  November 24, 2009 1:57 PM

## 2010-02-20 NOTE — Progress Notes (Signed)
   Phone Note Other Incoming Call back at [phone]   Caller: [facility] Summary of Call: Scheduled an appt with a dermatologist but was told that he needed a referral from [place].  Pt has an appt on 12/6  but will not be able to if referral not sent.  If there is any questions about this request, please call above number.  Dr.  Okey Regal office can be reached at [phone] Initial call taken by: Abundio Miu,  December 20, 2009 4:25 PM  Follow-up for Phone Call        Please give him a referral to Dermatology. If this is not possible, have him come in before the 6th to see whomever is available.  Thank you Follow-up by: Edd Arbour,  December 21, 2009 12:44 PM    Additional Follow-up for Phone Call Additional follow up Details #2::    referral sent Follow-up by: Loralee Pacas CMA,  December 22, 2009 1:51 PM

## 2010-02-20 NOTE — Assessment & Plan Note (Signed)
Summary: f/u bells palsey,df   Vital Signs:  Patient profile:   76 year old male Height:      68 inches Weight:      165.8 pounds BMI:     25.30 Temp:     98.0 degrees F oral Pulse rate:   98 / minute BP sitting:   119 / 75  (right arm) Cuff size:   regular  Vitals Entered By: Garen Grams LPN (February 07, 2009 11:36 AM) CC: Ed f/u for Bells palsy Is Patient Diabetic? Yes Did you bring your meter with you today? No Pain Assessment Patient in pain? no        Primary Care Provider:  Lequita Asal  MD  CC:  Ed f/u for Bells palsy.  History of Present Illness: 76 y/o male recently evaluated in ED for L sided facial droop. no other focal neurologic signs. diagnosed with Bell's Palsy. has been taking prednisone and acyclovir as well as using artificial tears. no progression of symptoms. no other neurologic findings. CT unremarkable for acute CVA  Current Medications (verified): 1)  Trazodone Hcl 50 Mg  Tabs (Trazodone Hcl) .Marland Kitchen.. 1 Tab By Mouth At Bedtime 2)  Benztropine Mesylate 1 Mg Tabs (Benztropine Mesylate) .Marland Kitchen.. 1 By Mouth At Bedtime 3)  Thioridazine Hcl 50 Mg  Tabs (Thioridazine Hcl) .Marland Kitchen.. 1 Tab By Mouth  Qam, 2 Tabs By Mouth Qhs 4)  Combivent 103-18 Mcg/act Aero (Albuterol-Ipratropium) .... Inhale 2 Puff Using Inhaler Twice A Day 5)  Colace 100 Mg Caps (Docusate Sodium) .... One Tab By Mouth Two Times A Day Scheduled 6)  Miralax   Powd (Polyethylene Glycol 3350) .Marland KitchenMarland KitchenMarland Kitchen 17 Gm in 4-8 Oz of Juice or Water By Mouth Two Times A Day Only As Needed For Constipation. May Hold For Loose Stools. Dispense 527 Grams. 7)  Dyazide 37.5-25 Mg Caps (Triamterene-Hctz) .Marland Kitchen.. 1 Capsule By Mouth Once A Day 8)  Zocor 20 Mg Tabs (Simvastatin) .... Take 1 Tablet By Mouth Every Night 9)  Eucerin  Crea (Skin Protectants, Misc.) .... Apply Liberally To Skin Twice A Day. Dispense 1 Large Jar 10)  Klor-Con 20 Meq  Pack (Potassium Chloride) .Marland Kitchen.. 1 Tab By Mouth Daily 11)  Ferrous Sulfate 325 (65 Fe) Mg  Tabs (Ferrous Sulfate) .... Take 1 Tablet By Daily 12)  Nexium 40 Mg  Cpdr (Esomeprazole Magnesium) .... One Tab By Mouth Two Times A Day For Esophageal Ulcers/barretts Esophagus 13)  Flovent Hfa 220 Mcg/act Aero (Fluticasone Propionate  Hfa) .... One Puff Bid 14)  Proair Hfa 108 (90 Base) Mcg/act Aers (Albuterol Sulfate) .... 2 Puffs Every 4 Hours As Needed For Shortness of Breath/wheezing.  Use With Spacer 15)  Naftin 1 % Crea (Naftifine Hcl) .... Apply To Affected Areas Once Daily. Disp 60 G Tube. 16)  Carafate 1 Gm/14ml Susp (Sucralfate) .... 2 Teaspoonfuls (1 Gm) By Mouth Qid. Disp Qs 51month. 17)  Artificial Tears  Soln (Artificial Tear Solution) .... 2 Gtt in L Eye Three Times A Day Until Eye Condition Resolves. Disp Qs 1 Month  Allergies (verified): 1)  ! Penicillin  Physical Exam  General:  alert, well-developed, well-nourished, and well-hydrated.  NAD. vitals reviewed.  Head:  unable to wrinkle forehead Eyes:  L ptosis. unable to fully close eye.  Ears:  no external deformities.   Mouth:  L sided facial droop.  Neurologic:  oriented to self; aside from facial changes, no other focal neurological signs. strength 5/5 BLE, BUE   Impression & Recommendations:  Problem #  1:  BELLS PALSY (ICD-351.0) Assessment New  complete antivirals and prednisone as prescribed. continue artificial tears. reds flags for a more concerning neuro process given to caregiver. f/u as needed.   Orders: [facility]- Est Level  3 (16073)  Complete Medication List: 1)  Trazodone Hcl 50 Mg Tabs (Trazodone hcl) .Marland Kitchen.. 1 tab by mouth at bedtime 2)  Benztropine Mesylate 1 Mg Tabs (Benztropine mesylate) .Marland Kitchen.. 1 by mouth at bedtime 3)  Thioridazine Hcl 50 Mg Tabs (Thioridazine hcl) .Marland Kitchen.. 1 tab by mouth  qam, 2 tabs by mouth qhs 4)  Combivent 103-18 Mcg/act Aero (Albuterol-ipratropium) .... Inhale 2 puff using inhaler twice a day 5)  Colace 100 Mg Caps (Docusate sodium) .... One tab by mouth two times a day scheduled 6)   Miralax Powd (Polyethylene glycol 3350) .Marland KitchenMarland Kitchen. 17 gm in 4-8 oz of juice or water by mouth two times a day only as needed for constipation. may hold for loose stools. dispense 527 grams. 7)  Dyazide 37.5-25 Mg Caps (Triamterene-hctz) .Marland Kitchen.. 1 capsule by mouth once a day 8)  Zocor 20 Mg Tabs (Simvastatin) .... Take 1 tablet by mouth every night 9)  Eucerin Crea (Skin protectants, misc.) .... Apply liberally to skin twice a day. dispense 1 large jar 10)  Klor-con 20 Meq Pack (Potassium chloride) .Marland Kitchen.. 1 tab by mouth daily 11)  Ferrous Sulfate 325 (65 Fe) Mg Tabs (Ferrous sulfate) .... Take 1 tablet by daily 12)  Nexium 40 Mg Cpdr (Esomeprazole magnesium) .... One tab by mouth two times a day for esophageal ulcers/barretts esophagus 13)  Flovent Hfa 220 Mcg/act Aero (Fluticasone propionate  hfa) .... One puff bid 14)  Proair Hfa 108 (90 Base) Mcg/act Aers (Albuterol sulfate) .... 2 puffs every 4 hours as needed for shortness of breath/wheezing.  use with spacer 15)  Naftin 1 % Crea (Naftifine hcl) .... Apply to affected areas once daily. disp 60 g tube. 16)  Carafate 1 Gm/32ml Susp (Sucralfate) .... 2 teaspoonfuls (1 gm) by mouth qid. disp qs 76month. 17)  Artificial Tears Soln (Artificial tear solution) .... 2 gtt in l eye three times a day until eye condition resolves. disp qs 1 month

## 2010-02-20 NOTE — Progress Notes (Signed)
Summary: phn msg   Phone Note Call from Patient Call back at [phone]   Caller: [name] [name] Summary of Call: pt got aggressive toward staff and other residents - punched a hole in the wall today - stopped taking meds -  he is now calm and laughing and acts like he doesn't remember what he did. Initial call taken by: De Nurse,  February 08, 2009 3:23 PM  Follow-up for Phone Call        they probably should inform his psychiatrist Follow-up by: Lequita Asal  MD,  February 08, 2009 3:25 PM  Additional Follow-up for Phone Call Additional follow up Details #1::        Spoke with [name] and informed him of Dr Angelita Ingles instructions Additional Follow-up by: Gladstone Pih,  February 09, 2009 11:56 AM

## 2010-02-20 NOTE — Assessment & Plan Note (Signed)
Summary: meet new doctor and refill mds/ls   Vital Signs:  Patient profile:   77 year old male Height:      68 inches Weight:      162 pounds BMI:     24.72 Pulse rate:   76 / minute BP sitting:   134 / 83  (left arm) Cuff size:   large  Vitals Entered By: Jimmy Footman, CMA (December 01, 2009 2:41 PM) CC: meet new doc Is Patient Diabetic? No   Primary Provider:  Edd Arbour  CC:  meet new doc.  History of Present Illness: 1. eczema patient has dry skin and eczema on his left hand. He is using eucerin ointment with success per his cartetakers. The patient admits to scratching this area. He will continue to apply the cream.  2. mental retardation lives in assisted living, doing well otherwise.  3. constipation palpable stool on physical exam. he is already on docusate. advised to start taking miralax daily and to increase water intake.  4. htn well controlled at assisted living, continue current regimen  5. COPD has no wheezing on exam. has not had an exacerbation in a long time.    Preventive Screening-Counseling & Management  Alcohol-Tobacco     Smoking Status: quit  Problems Prior to Update: 1)  Tobacco Use, Quit  (ICD-V15.82) 2)  Hypertension, Benign Systemic  (ICD-401.1) 3)  Bells Palsy  (ICD-351.0) 4)  Cholelithiasis  (ICD-574.20) 5)  Barretts Esophagus  (ICD-530.85) 6)  Gastroesophageal Reflux, No Esophagitis  (ICD-530.81) 7)  Hernia, Hiatal, Noncongenital  (ICD-553.3) 8)  Anemia, Iron Deficiency Nos  (ICD-280.9) 9)  Hx of Gi Bleeding  (ICD-578.9) 10)  Hx of Esophageal Ulcer, With Bleeding  (ICD-530.21) 11)  COPD  (ICD-496) 12)  Hyperlipidemia  (ICD-272.4) 13)  Hx of Hypokalemia  (ICD-276.8) 14)  Psychosis, Unspecified  (ICD-298.9) 15)  Mental Retardation  (ICD-319) 16)  Asthma, Intermittent  (ICD-493.90)  Medications Prior to Update: 1)  Trazodone Hcl 50 Mg  Tabs (Trazodone Hcl) .Marland Kitchen.. 1 Tab By Mouth At Bedtime 2)  Benztropine Mesylate 1 Mg Tabs  (Benztropine Mesylate) .Marland Kitchen.. 1 By Mouth At Bedtime 3)  Thioridazine Hcl 50 Mg  Tabs (Thioridazine Hcl) .Marland Kitchen.. 1 Tab By Mouth  Qam, 2 Tabs By Mouth Qhs 4)  Combivent 103-18 Mcg/act Aero (Albuterol-Ipratropium) .... Inhale 2 Puff Using Inhaler Twice A Day 5)  Colace 100 Mg Caps (Docusate Sodium) .... One Tab By Mouth Two Times A Day Scheduled 6)  Miralax   Powd (Polyethylene Glycol 3350) .Marland KitchenMarland KitchenMarland Kitchen 17 Gm in 4-8 Oz of Juice or Water By Mouth Two Times A Day Only As Needed For Constipation. May Hold For Loose Stools. Dispense 527 Grams. 7)  Dyazide 37.5-25 Mg Caps (Triamterene-Hctz) .Marland Kitchen.. 1 Capsule By Mouth Once A Day 8)  Zocor 20 Mg Tabs (Simvastatin) .... Take 1 Tablet By Mouth Every Night 9)  Eucerin  Crea (Skin Protectants, Misc.) .... Apply Liberally To Skin Twice A Day. Dispense 1 Large Jar 10)  Klor-Con 20 Meq  Pack (Potassium Chloride) .Marland Kitchen.. 1 Tab By Mouth Daily 11)  Ferrous Sulfate 325 (65 Fe) Mg Tabs (Ferrous Sulfate) .... Take 1 Tablet By Daily 12)  Nexium 40 Mg  Cpdr (Esomeprazole Magnesium) .... One Tab By Mouth Two Times A Day For Esophageal Ulcers/barretts Esophagus 13)  Flovent Hfa 220 Mcg/act Aero (Fluticasone Propionate  Hfa) .... One Puff Bid 14)  Proair Hfa 108 (90 Base) Mcg/act Aers (Albuterol Sulfate) .... 2 Puffs Every 4 Hours As Needed For  Shortness of Breath/wheezing.  Use With Spacer 15)  Naftin 1 % Crea (Naftifine Hcl) .... Apply To Affected Areas Once Daily. Disp 60 G Tube. 16)  Carafate 1 Gm/6ml Susp (Sucralfate) .... 2 Teaspoonfuls (1 Gm) By Mouth Qid. Disp Qs 58month. 17)  Artificial Tears  Soln (Artificial Tear Solution) .... 2 Gtt in L Eye Three Times A Day Until Eye Condition Resolves. Disp Qs 1 Month  Current Medications (verified): 1)  Trazodone Hcl 50 Mg  Tabs (Trazodone Hcl) .Marland Kitchen.. 1 Tab By Mouth At Bedtime 2)  Benztropine Mesylate 1 Mg Tabs (Benztropine Mesylate) .Marland Kitchen.. 1 By Mouth At Bedtime 3)  Thioridazine Hcl 50 Mg  Tabs (Thioridazine Hcl) .Marland Kitchen.. 1 Tab By Mouth  Qam, 2 Tabs  By Mouth Qhs 4)  Combivent 103-18 Mcg/act Aero (Albuterol-Ipratropium) .... Inhale 2 Puff Using Inhaler Twice A Day 5)  Colace 100 Mg Caps (Docusate Sodium) .... One Tab By Mouth Two Times A Day Scheduled 6)  Miralax   Powd (Polyethylene Glycol 3350) .Marland KitchenMarland KitchenMarland Kitchen 17 Gm in 4-8 Oz of Juice or Water By Mouth Two Times A Day Only As Needed For Constipation. May Hold For Loose Stools. Dispense 527 Grams. 7)  Dyazide 37.5-25 Mg Caps (Triamterene-Hctz) .Marland Kitchen.. 1 Capsule By Mouth Once A Day 8)  Zocor 20 Mg Tabs (Simvastatin) .... Take 1 Tablet By Mouth Every Night 9)  Eucerin  Crea (Skin Protectants, Misc.) .... Apply Liberally To Skin Twice A Day. Dispense 1 Large Jar 10)  Klor-Con 20 Meq  Pack (Potassium Chloride) .Marland Kitchen.. 1 Tab By Mouth Daily 11)  Ferrous Sulfate 325 (65 Fe) Mg Tabs (Ferrous Sulfate) .... Take 1 Tablet By Daily 12)  Nexium 40 Mg  Cpdr (Esomeprazole Magnesium) .... One Tab By Mouth Two Times A Day For Esophageal Ulcers/barretts Esophagus 13)  Flovent Hfa 220 Mcg/act Aero (Fluticasone Propionate  Hfa) .... One Puff Bid 14)  Proair Hfa 108 (90 Base) Mcg/act Aers (Albuterol Sulfate) .... 2 Puffs Every 4 Hours As Needed For Shortness of Breath/wheezing.  Use With Spacer 15)  Naftin 1 % Crea (Naftifine Hcl) .... Apply To Affected Areas Once Daily. Disp 60 G Tube. 16)  Carafate 1 Gm/58ml Susp (Sucralfate) .... 2 Teaspoonfuls (1 Gm) By Mouth Qid. Disp Qs 58month. 17)  Artificial Tears  Soln (Artificial Tear Solution) .... 2 Gtt in L Eye Three Times A Day Until Eye Condition Resolves. Disp Qs 1 Month 18)  Miralax  Powd (Polyethylene Glycol 3350) .... Two Heaping Spoonfulls A Day For Constipation  Allergies (verified): 1)  ! Penicillin  Family History: Reviewed history from 03/20/2006 and no changes required. No family history of CAD  Social History: Reviewed history from 02/02/2008 and no changes required. lives in group home - [facility].  Attends [facility] during daytime. Smoked 1ppd  until 15 years ago when entered home.   no alcohol; behavioral problemsSmoking Status:  quit  Review of Systems       see HPI  Physical Exam  General:  alert, well-developed, and unkempt.   Head:  Normocephalic and atraumatic without obvious abnormalities. No apparent alopecia or balding. Eyes:  No corneal or conjunctival inflammation noted. EOMI. Perrla. Funduscopic exam benign, without hemorrhages, exudates or papilledema. Vision grossly normal. Ears:  External ear exam shows no significant lesions or deformities.  Otoscopic examination reveals clear canals, tympanic membranes are intact bilaterally without bulging, retraction, inflammation or discharge. Hearing is grossly normal bilaterally. Nose:  External nasal examination shows no deformity or inflammation. Nasal mucosa are pink  and moist without lesions or exudates. Mouth:  teeth missing and xerostomia.   Neck:  supple.   Lungs:  Normal respiratory effort, chest expands symmetrically. Lungs are clear to auscultation, no crackles or wheezes. Heart:  Normal rate and regular rhythm. S1 and S2 normal without gallop, murmur, click, rub or other extra sounds. Abdomen:  Bowel sounds positive,abdomen soft and non-tender without masses, organomegaly or hernias noted. palpable balls of stool in the right lower quadrant Skin:  dry skin with peeling Psych:  dysphoric affect, poor eye contact, labile affect, easily distracted, poor concentration, and memory impairment.     Impression & Recommendations:  Problem # 1:  ECZEMA (ICD-692.9) Assessment Deteriorated continue with eucerin cream  Orders: [facility] - Est  65+ [phone])  Problem # 2:  HYPERTENSION, BENIGN SYSTEMIC (ICD-401.1) Assessment: Unchanged  His updated medication list for this problem includes:    Dyazide 37.5-25 Mg Caps (Triamterene-hctz) .Marland Kitchen... 1 capsule by mouth once a day  Orders: [facility] - Est  65+ [phone])  Problem # 3:  COPD (ICD-496) Assessment: Improved  His updated  medication list for this problem includes:    Combivent 103-18 Mcg/act Aero (Albuterol-ipratropium) ..... Inhale 2 puff using inhaler twice a day    Flovent Hfa 220 Mcg/act Aero (Fluticasone propionate  hfa) ..... One puff bid    Proair Hfa 108 (90 Base) Mcg/act Aers (Albuterol sulfate) .Marland Kitchen... 2 puffs every 4 hours as needed for shortness of breath/wheezing.  use with spacer  Orders: [facility] - Est  65+ [phone])  Problem # 4:  constipation Assessment: New added miralax to his regimen  Complete Medication List: 1)  Trazodone Hcl 50 Mg Tabs (Trazodone hcl) .Marland Kitchen.. 1 tab by mouth at bedtime 2)  Benztropine Mesylate 1 Mg Tabs (Benztropine mesylate) .Marland Kitchen.. 1 by mouth at bedtime 3)  Thioridazine Hcl 50 Mg Tabs (Thioridazine hcl) .Marland Kitchen.. 1 tab by mouth  qam, 2 tabs by mouth qhs 4)  Combivent 103-18 Mcg/act Aero (Albuterol-ipratropium) .... Inhale 2 puff using inhaler twice a day 5)  Colace 100 Mg Caps (Docusate sodium) .... One tab by mouth two times a day scheduled 6)  Miralax Powd (Polyethylene glycol 3350) .Marland KitchenMarland Kitchen. 17 gm in 4-8 oz of juice or water by mouth two times a day only as needed for constipation. may hold for loose stools. dispense 527 grams. 7)  Dyazide 37.5-25 Mg Caps (Triamterene-hctz) .Marland Kitchen.. 1 capsule by mouth once a day 8)  Zocor 20 Mg Tabs (Simvastatin) .... Take 1 tablet by mouth every night 9)  Eucerin Crea (Skin protectants, misc.) .... Apply liberally to skin twice a day. dispense 1 large jar 10)  Klor-con 20 Meq Pack (Potassium chloride) .Marland Kitchen.. 1 tab by mouth daily 11)  Ferrous Sulfate 325 (65 Fe) Mg Tabs (Ferrous sulfate) .... Take 1 tablet by daily 12)  Nexium 40 Mg Cpdr (Esomeprazole magnesium) .... One tab by mouth two times a day for esophageal ulcers/barretts esophagus 13)  Flovent Hfa 220 Mcg/act Aero (Fluticasone propionate  hfa) .... One puff bid 14)  Proair Hfa 108 (90 Base) Mcg/act Aers (Albuterol sulfate) .... 2 puffs every 4 hours as needed for shortness of breath/wheezing.  use with  spacer 15)  Naftin 1 % Crea (Naftifine hcl) .... Apply to affected areas once daily. disp 60 g tube. 16)  Carafate 1 Gm/15ml Susp (Sucralfate) .... 2 teaspoonfuls (1 gm) by mouth qid. disp qs 54month. 17)  Artificial Tears Soln (Artificial tear solution) .... 2 gtt in l eye three times a day until  eye condition resolves. disp qs 1 month 18)  Miralax Powd (Polyethylene glycol 3350) .... Two heaping spoonfulls a day for constipation  Patient Instructions: 1)  Please schedule a follow-up appointment in 6 months. Prescriptions: MIRALAX  POWD (POLYETHYLENE GLYCOL 3350) two heaping spoonfulls a day for constipation  #1 x 6   Entered and Authorized by:   Edd Arbour   Signed by:   Edd Arbour on 12/01/2009   Method used:   Electronically to        [facility]* (retail)       [address]       [place], [place]  [address]       Ph: [phone]       Fax: [phone]   RxID:   [id number]    Orders Added: 1)  [facility] - Est  65+ [phone]

## 2010-02-20 NOTE — Miscellaneous (Signed)
   Clinical Lists Changes  Problems: Reactivated problem of HYPERTENSION, BENIGN SYSTEMIC (ICD-401.1)

## 2010-02-20 NOTE — Miscellaneous (Signed)
Summary: Order for artificial tears  Please place 2 drops in left eye three times a day until Bell's palsy resolves.  Lequita Asal  MD  February 02, 2009 4:21 PM   Prescriptions: ARTIFICIAL TEARS  SOLN (ARTIFICIAL TEAR SOLUTION) 2 gtt in L eye three times a day until eye condition resolves. disp qs 1 month  #1 x 4   Entered and Authorized by:   Lequita Asal  MD   Signed by:   Lequita Asal  MD on 02/02/2009   Method used:   Electronically to        [facility]* (retail)       [address]       [place], [place]  [address]       Ph: [phone]       Fax: [phone]   RxID:   [id number]

## 2010-02-20 NOTE — Letter (Signed)
Summary: [facility]  [facility]   Imported By: Clydell Hakim 02/15/2009 12:07:11  _____________________________________________________________________  External Attachment:    Type:   Image     Comment:   External Document

## 2010-02-20 NOTE — Miscellaneous (Signed)
   Clinical Lists Changes  Problems: Changed problem from ASTHMA, UNSPECIFIED (ICD-493.90) to ASTHMA, INTERMITTENT (ICD-493.90) 

## 2010-02-20 NOTE — Miscellaneous (Signed)
  Current Meds:  TRAZODONE HCL 50 MG  TABS (TRAZODONE HCL) 1 tab by mouth at bedtime BENZTROPINE MESYLATE 1 MG TABS (BENZTROPINE MESYLATE) 1 by mouth at bedtime THIORIDAZINE HCL 50 MG  TABS (THIORIDAZINE HCL) 1 tab by mouth  qam, 2 tabs by mouth qhs COMBIVENT 103-18 MCG/ACT AERO (ALBUTEROL-IPRATROPIUM) Inhale 2 puff using inhaler twice a day COLACE 100 MG CAPS (DOCUSATE SODIUM) one tab by mouth two times a day scheduled MIRALAX   POWD (POLYETHYLENE GLYCOL 3350) 17 gm in 4-8 oz of juice or water by mouth two times a day only as needed for constipation. May hold for loose stools. Dispense 527 grams. DYAZIDE 37.5-25 MG CAPS (TRIAMTERENE-HCTZ) 1 capsule by mouth once a day ZOCOR 20 MG TABS (SIMVASTATIN) Take 1 tablet by mouth every night EUCERIN  CREA (SKIN PROTECTANTS, MISC.) Apply liberally to skin twice a day. dispense 1 large jar KLOR-CON 20 MEQ  PACK (POTASSIUM CHLORIDE) 1 tab by mouth daily FERROUS SULFATE 325 (65 FE) MG TABS (FERROUS SULFATE) Take 1 tablet by daily NEXIUM 40 MG  CPDR (ESOMEPRAZOLE MAGNESIUM) one tab by mouth two times a day for esophageal ulcers/barretts esophagus FLOVENT HFA 220 MCG/ACT AERO (FLUTICASONE PROPIONATE  HFA) one puff bid PROAIR HFA 108 (90 BASE) MCG/ACT AERS (ALBUTEROL SULFATE) 2 puffs every 4 hours as needed for shortness of breath/wheezing.  use with spacer NAFTIN 1 % CREA (NAFTIFINE HCL) apply to affected areas once daily. disp 60 g tube. CARAFATE 1 GM/10ML SUSP (SUCRALFATE) 2 teaspoonfuls (1 gm) by mouth QID. disp QS 20month. ARTIFICIAL TEARS  SOLN (ARTIFICIAL TEAR SOLUTION) 2 gtt in L eye three times a day until eye condition resolves. disp qs 1 month ULTRAM 50 MG TABS (TRAMADOL HCL) one tab by mouth q6 hours as needed for pain x4 weeks

## 2010-02-20 NOTE — Miscellaneous (Signed)
  Medications Added TRAZODONE HCL 50 MG  TABS (TRAZODONE HCL) 1 tab by mouth at bedtime BENZTROPINE MESYLATE 1 MG TABS (BENZTROPINE MESYLATE) 1 by mouth at bedtime THIORIDAZINE HCL 50 MG  TABS (THIORIDAZINE HCL) 1 tab by mouth  qam, 2 tabs by mouth qhs       Clinical Lists Changes  Medications: Changed medication from TRAZODONE HCL 50 MG  TABS (TRAZODONE HCL) 1 tab by mouth at bedtime per Dr. Theophilus Bones to TRAZODONE HCL 50 MG  TABS (TRAZODONE HCL) 1 tab by mouth at bedtime Changed medication from BENZTROPINE MESYLATE 1 MG TABS (BENZTROPINE MESYLATE) 1 by mouth at bedtime to BENZTROPINE MESYLATE 1 MG TABS (BENZTROPINE MESYLATE) 1 by mouth at bedtime Changed medication from THIORIDAZINE HCL 50 MG  TABS (THIORIDAZINE HCL) 1 tab by mouth two times a day per Dr Theophilus Bones to THIORIDAZINE HCL 50 MG  TABS (THIORIDAZINE HCL) 1 tab by mouth  qam, 2 tabs by mouth qhs

## 2010-02-20 NOTE — Progress Notes (Signed)
   Phone Note Other Incoming Call back at [phone]   Caller: [employer]. Summary of Call: Need a referral for dermatologists [place] [facility].  There ph# is [phone]. Informed case mgr may need an appt before referral made. Initial call taken by: Abundio Miu,  November 29, 2009 2:52 PM  Follow-up for Phone Call        yes I need to see him before referring.  Thank you  :) Follow-up by: Edd Arbour,  November 29, 2009 3:24 PM

## 2010-02-20 NOTE — Miscellaneous (Signed)
Summary: Orders Update  Clinical Lists Changes  Orders: Added new Test order of [facility]- Est  Level 4 (99214) - Signed 

## 2010-02-20 NOTE — Miscellaneous (Signed)
Summary: [facility] recs  note dated 6/23 from Hazle Coca, PA-C recommends: 1) namenda 2) checking B12, folate 3) consider vitamin supplementation and/or use of Axona.  Lequita Asal  MD  July 18, 2009 5:22 PM

## 2010-02-28 NOTE — Miscellaneous (Signed)
Summary: FL2  Patients group care manager dropped off FL2 form to be filled out.  Please call him when completed. Bradly Bienenstock  February 16, 2010 2:05 PM  FL2 form placed in Dr. Rolene Arbour box for completion  Terese Door  February 16, 2010 2:07 PM

## 2010-04-03 ENCOUNTER — Ambulatory Visit (INDEPENDENT_AMBULATORY_CARE_PROVIDER_SITE_OTHER): Payer: Medicaid Other

## 2010-04-03 ENCOUNTER — Inpatient Hospital Stay (INDEPENDENT_AMBULATORY_CARE_PROVIDER_SITE_OTHER)
Admission: RE | Admit: 2010-04-03 | Discharge: 2010-04-03 | Disposition: A | Discharge status: Home / Self Care | Payer: Medicaid Other | Source: Ambulatory Visit | Attending: Emergency Medicine | Admitting: Emergency Medicine

## 2010-04-03 DIAGNOSIS — H1089 Other conjunctivitis: Secondary | ICD-10-CM

## 2010-04-04 ENCOUNTER — Ambulatory Visit (INDEPENDENT_AMBULATORY_CARE_PROVIDER_SITE_OTHER): Payer: Medicare Other | Admitting: Family Medicine

## 2010-04-04 DIAGNOSIS — F79 Unspecified intellectual disabilities: Secondary | ICD-10-CM

## 2010-04-04 DIAGNOSIS — J309 Allergic rhinitis, unspecified: Secondary | ICD-10-CM | POA: Insufficient documentation

## 2010-04-04 NOTE — Assessment & Plan Note (Addendum)
Reviewed medications list and filled out FL2 per long term facility [facility] and our records.  F/u with PCP as regularly scheduled - due for 6 month check in April.

## 2010-04-04 NOTE — Assessment & Plan Note (Signed)
Advised to start treatment as given by urgent care.  Follow-up with PCP if no improvement.

## 2010-04-04 NOTE — Patient Instructions (Signed)
Please schedule 6 month follow-up with Dr. Rivka Safer in April Medication list has been updated

## 2010-04-04 NOTE — Progress Notes (Signed)
  Subjective:    Patient ID: [name] [name], male    DOB: 10/09/1935, 76 y.o.   MRN: [id number]  HPI Was seen at urgent care yesterday and prescribed pataday for allergic symptoms causing eye itching as he is somewhat uninhibited due to MR and dementia.  Her is here with group home representative who plans on picking up medications today.  No eye discharge, vision change.  Here today to fill out forms for continued long term care.  I reviewed old chart, and current MAR from facility.  I updated our records with medications from current MAR and filled out FL2 and PRn medications.  Forms scanned to the medical record.  Patient unable to give verbal history due to comorbidities.  Review of Systemssee HPI     Objective:   Physical Exam  Eyes: EOM are normal. Pupils are equal, round, and reactive to light. Left eye exhibits no discharge.       Bilateral conjunctiva injected          Assessment & Plan:

## 2010-04-08 LAB — DIFFERENTIAL
Basophils Absolute: 0.1 10*3/uL (ref 0.0–0.1)
Basophils Relative: 0 % (ref 0–1)
Lymphocytes Relative: 24 % (ref 12–46)
Monocytes Absolute: 1.4 10*3/uL — ABNORMAL HIGH (ref 0.1–1.0)
Neutro Abs: 8.9 10*3/uL — ABNORMAL HIGH (ref 1.7–7.7)
Neutrophils Relative %: 60 % (ref 43–77)

## 2010-04-08 LAB — URINALYSIS, ROUTINE W REFLEX MICROSCOPIC
Ketones, ur: NEGATIVE mg/dL
Nitrite: NEGATIVE
Protein, ur: NEGATIVE mg/dL
Urobilinogen, UA: 0.2 mg/dL (ref 0.0–1.0)

## 2010-04-08 LAB — BASIC METABOLIC PANEL
BUN: 7 mg/dL (ref 6–23)
CO2: 30 mEq/L (ref 19–32)
Calcium: 9.2 mg/dL (ref 8.4–10.5)
Creatinine, Ser: 0.83 mg/dL (ref 0.4–1.5)
GFR calc non Af Amer: >60 mL/min (ref >60)
Glucose, Bld: 122 mg/dL — ABNORMAL HIGH (ref 70–99)

## 2010-04-08 LAB — GLUCOSE, CAPILLARY: Glucose-Capillary: 109 mg/dL — ABNORMAL HIGH (ref 70–99)

## 2010-04-08 LAB — CBC
MCHC: 33.6 g/dL (ref 30.0–36.0)
Platelets: 391 10*3/uL (ref 150–400)
RDW: 15.5 % (ref 11.5–15.5)

## 2010-04-19 ENCOUNTER — Other Ambulatory Visit: Payer: Self-pay | Admitting: Family Medicine

## 2010-04-19 MED ORDER — CLOTRIMAZOLE 1 % EX CREA: TOPICAL_CREAM | Freq: Two times a day (BID) | CUTANEOUS | Status: AC

## 2010-05-03 LAB — COMPREHENSIVE METABOLIC PANEL
ALT: 26 U/L (ref 0–53)
Albumin: 3.5 g/dL (ref 3.5–5.2)
Alkaline Phosphatase: 81 U/L (ref 39–117)
Potassium: 3.1 mEq/L — ABNORMAL LOW (ref 3.5–5.1)
Sodium: 133 mEq/L — ABNORMAL LOW (ref 135–145)
Total Protein: 7.3 g/dL (ref 6.0–8.3)

## 2010-05-03 LAB — URINALYSIS, ROUTINE W REFLEX MICROSCOPIC
Glucose, UA: NEGATIVE mg/dL
Hgb urine dipstick: NEGATIVE
Protein, ur: NEGATIVE mg/dL
pH: 8 (ref 5.0–8.0)

## 2010-05-03 LAB — DIFFERENTIAL
Basophils Relative: 1 % (ref 0–1)
Lymphs Abs: 2.7 10*3/uL (ref 0.7–4.0)
Monocytes Absolute: 1.7 10*3/uL — ABNORMAL HIGH (ref 0.1–1.0)
Monocytes Relative: 8 % (ref 3–12)
Neutro Abs: 15.6 10*3/uL — ABNORMAL HIGH (ref 1.7–7.7)

## 2010-05-03 LAB — CBC
Platelets: 425 10*3/uL — ABNORMAL HIGH (ref 150–400)
RDW: 15.5 % (ref 11.5–15.5)

## 2010-05-07 LAB — COMPREHENSIVE METABOLIC PANEL
ALT: 19 U/L (ref 0–53)
Alkaline Phosphatase: 82 U/L (ref 39–117)
BUN: 20 mg/dL (ref 6–23)
CO2: 27 mEq/L (ref 19–32)
Chloride: 92 mEq/L — ABNORMAL LOW (ref 96–112)
GFR calc non Af Amer: >60 mL/min (ref >60)
Glucose, Bld: 132 mg/dL — ABNORMAL HIGH (ref 70–99)
Potassium: 3.6 mEq/L (ref 3.5–5.1)
Sodium: 134 mEq/L — ABNORMAL LOW (ref 135–145)
Total Bilirubin: 0.6 mg/dL (ref 0.3–1.2)

## 2010-05-07 LAB — CBC
HCT: 38 % — ABNORMAL LOW (ref 39.0–52.0)
HCT: 38 % — ABNORMAL LOW (ref 39.0–52.0)
HCT: 40.1 % (ref 39.0–52.0)
HCT: 44.3 % (ref 39.0–52.0)
Hemoglobin: 12.2 g/dL — ABNORMAL LOW (ref 13.0–17.0)
Hemoglobin: 12.6 g/dL — ABNORMAL LOW (ref 13.0–17.0)
Hemoglobin: 13.1 g/dL (ref 13.0–17.0)
Hemoglobin: 14.3 g/dL (ref 13.0–17.0)
MCHC: 32.2 g/dL (ref 30.0–36.0)
MCHC: 33.2 g/dL (ref 30.0–36.0)
MCV: 92.5 fL (ref 78.0–100.0)
MCV: 92.9 fL (ref 78.0–100.0)
Platelets: 372 10*3/uL (ref 150–400)
Platelets: 412 10*3/uL — ABNORMAL HIGH (ref 150–400)
Platelets: 414 K/uL — ABNORMAL HIGH (ref 150–400)
RBC: 4.08 MIL/uL — ABNORMAL LOW (ref 4.22–5.81)
RBC: 4.11 MIL/uL — ABNORMAL LOW (ref 4.22–5.81)
RBC: 4.81 MIL/uL (ref 4.22–5.81)
RDW: 16.6 % — ABNORMAL HIGH (ref 11.5–15.5)
RDW: 17 % — ABNORMAL HIGH (ref 11.5–15.5)
RDW: 17.3 % — ABNORMAL HIGH (ref 11.5–15.5)
RDW: 17.5 % — ABNORMAL HIGH (ref 11.5–15.5)
WBC: 10.7 10*3/uL — ABNORMAL HIGH (ref 4.0–10.5)
WBC: 11.7 10*3/uL — ABNORMAL HIGH (ref 4.0–10.5)
WBC: 13.8 10*3/uL — ABNORMAL HIGH (ref 4.0–10.5)
WBC: 13.8 10*3/uL — ABNORMAL HIGH (ref 4.0–10.5)

## 2010-05-07 LAB — BASIC METABOLIC PANEL WITH GFR
CO2: 30 meq/L (ref 19–32)
Chloride: 101 meq/L (ref 96–112)
GFR calc Af Amer: >60 mL/min (ref >60)
Sodium: 136 meq/L (ref 135–145)

## 2010-05-07 LAB — CK TOTAL AND CKMB (NOT AT ARMC): Total CK: 169 U/L (ref 7–232)

## 2010-05-07 LAB — BASIC METABOLIC PANEL
BUN: 14 mg/dL (ref 6–23)
BUN: 8 mg/dL (ref 6–23)
Calcium: 8.5 mg/dL (ref 8.4–10.5)
Chloride: 95 mEq/L — ABNORMAL LOW (ref 96–112)
Creatinine, Ser: 0.89 mg/dL (ref 0.4–1.5)
GFR calc non Af Amer: >60 mL/min (ref >60)
GFR calc non Af Amer: >60 mL/min (ref >60)
Glucose, Bld: 103 mg/dL — ABNORMAL HIGH (ref 70–99)
Glucose, Bld: 115 mg/dL — ABNORMAL HIGH (ref 70–99)
Potassium: 3 mEq/L — ABNORMAL LOW (ref 3.5–5.1)
Potassium: 3.5 mEq/L (ref 3.5–5.1)
Sodium: 133 mEq/L — ABNORMAL LOW (ref 135–145)

## 2010-05-07 LAB — CARDIAC PANEL(CRET KIN+CKTOT+MB+TROPI)
CK, MB: 1.4 ng/mL (ref 0.3–4.0)
CK, MB: 1.4 ng/mL (ref 0.3–4.0)
Relative Index: 1.1 (ref 0.0–2.5)
Relative Index: 1.2 (ref 0.0–2.5)
Total CK: 114 U/L (ref 7–232)
Troponin I: 0.02 ng/mL (ref 0.00–0.06)

## 2010-05-07 LAB — POCT CARDIAC MARKERS
CKMB, poc: 1.1 ng/mL (ref 1.0–8.0)
Myoglobin, poc: 92.7 ng/mL (ref 12–200)

## 2010-05-07 LAB — HEMOCCULT GUIAC POC 1CARD (OFFICE): Fecal Occult Bld: NEGATIVE

## 2010-05-07 LAB — PROTIME-INR
INR: 1.1 (ref 0.00–1.49)
Prothrombin Time: 14.1 seconds (ref 11.6–15.2)

## 2010-05-07 LAB — APTT: aPTT: 29 seconds (ref 24–37)

## 2010-05-07 LAB — LACTIC ACID, PLASMA: Lactic Acid, Venous: 2.5 mmol/L — ABNORMAL HIGH (ref 0.5–2.2)

## 2010-06-05 NOTE — Discharge Summary (Signed)
[name] [name], [name]               ACCOUNT NO.:  [id number]   MEDICAL RECORD NO.:  [id number]          PATIENT TYPE:  INP   LOCATION:  5532                         FACILITY:  [facility]   PHYSICIAN:  [name] [name], M.D.DATE OF BIRTH:  10/09/1935   DATE OF ADMISSION:  02/01/2008  DATE OF DISCHARGE:  02/04/2008                               DISCHARGE SUMMARY   ADMISSION DIAGNOSES:  1. Esophageal Ulcer with Gastrointestinal bleed.  2. Chronic obstructive pulmonary disease.  3. Hypertension.  4. Schizophrenia.  5. Mental retardation.  6. Hiatal hernia.  7. History of esophageal ulcer and Barrett esophagus.   DISCHARGE DIAGNOSES:  1. Peptic ulcer.  2. History of esophageal ulcer and Barrett esophagus.  3. Chronic obstructive pulmonary disease.  4. Hypertension.  5. Schizophrenia.  6. Mental retardation.  7. Hiatal hernia.   CONSULTS DURING DISCHARGE:  [facility].   PROCEDURES DONE DURING ADMISSION:  Esophagogastroduodenoscopy, which  found an esophageal ulcer.   PERTINENT LABORATORY AT ADMISSION:  A CBC that showed a hemoglobin of  12.6.  Coags that were normal.  PT, PTT, and INR.  Pylori antigen that  was negative.  Cardiac enzymes were negative x1.  Hemoglobin at  discharge that showed no significant increase or drop in hemoglobin.  The patient had a chest x-ray on admission that showed chronic lung  disease with some possible superimposed active process in the left lower  lobe.  Repeat chest x-ray after hydration showed no changes and it was  concluded that the left lower lobe process was just scarring.   HISTORY:  Briefly, the patient is a 76 year old male with multiple  issues including history of peptic ulcer disease with GI bleeding,  Barrett esophagus, chronic obstructive pulmonary disease, who presented  with coffee-ground emesis.  Per caretaker from group home, the patient  was having nausea and vomiting for 1 week and epigastric pain despite  treating  with Phenergan.  Prior to admission, the patient had persistent  nausea and vomiting, and began to have coffee-ground emesis, which was  witnessed in the ED as well.  The patient was unable to tolerate any  fluids by mouth.  The patient was also complaining of chest pain, which  is a chronic issue for him, which he said is relieved when he vomits.  The patient was recently evaluated by Cardiology as an outpatient.  We  did not think further workup is warranted.  No record of any fever,  chills, cough, diarrhea, constipation.  Chest pain was not radiating  anywhere.   HOSPITAL COURSE:  1. Coffee-ground emesis.  This resolved when the patient was brought      up to the floor.  Diet was slowly reinstituted and by discharge. He      was tolerating a regular diet without nausea or vomiting.  Just      prior to discharge, the patient developed one further episode of      coffee-ground emesis.  Gastroenterology was called.  Dr. Matthias [name] of      [facility] sent his partner Dr. Madilyn [name] and an upper GI  endoscopy was performed that showed a stable esophageal ulcer.  The      patient was maintained on Protonix 40 mg p.o. b.i.d. during      hospitalization, and there was no significant drop in the patient's      hemoglobin.  No further intervention was done.  2. COPD remained stable on the patient's home meds.  3. Hypertension remained stable on home meds.  4. Schizophrenia and mental retardation were both stable throughout      admission.  Of note, a prior authorization was completed, so the      patient can get a high dose proton pump inhibitors as an      outpatient.  He meets the criteria because of failure on regular      dose PPI and need for hospitalization for upper GI bleed.   DISCHARGE DISPOSITION:  Back to his group home.   DISCHARGE CONDITION:  Stable and improved.   Followup will be with Dr. Matthias [name] or Dr. Madilyn [name] at [facility].  This will be on February 18, 2008, at 11:15 a.m.  Followup will also be with Dr. Lequita [name] at the [facility] on Monday, February 08, 2008, at 2:30 p.m.   MEDICATIONS AT DISCHARGE:  1. Trazodone 50 mg p.o. nightly.  2. Benztropine 1 mg p.o. nightly.  3. Thioridazine 50 mg p.o. in the morning and 100 mg p.o. nightly.  4. Combivent 2 puffs b.i.d.  5. Colace 100 mg p.o. b.i.d.  6. MiraLax 17 g in 4-8 ounces of fluid twice a day as needed for      constipation, may hold for loose stools.  7. Dyazide 37.5/25 mg 1 tablet p.o. daily.  8. Zocor 20 mg p.o. nightly.  9. Eucerin cream applied to skin twice a day.  10.Potassium chloride 20 mEq p.o. daily.  11.Ferrous sulfate 325 mg p.o. daily.  12.Omeprazole 40 mg p.o. b.i.d.  13.Zofran 8 mg ODT q.8 h. scheduled.  14.Flovent 110 mcg 1 puff b.i.d.  15.Albuterol inhaler 2 puffs q.4 hours p.r.n. shortness of breath.  16.Naftin cream applied to affected areas once daily.  17.Sucralfate oral suspension 10 mg p.o. q.i.d.   The patient is instructed to stop taking Phenergan.   Issues to follow up as an outpatient will be getting the patient's  approval for high-dose PPI (his prior approval form was completed in the  hospital) and followup of his Gastroenterology visit.      [name] Langton, MD  Electronically Signed      [name] [name], M.D.  Electronically Signed    TT/MEDQ  D:  02/04/2008  T:  02/05/2008  Job:  [id number]

## 2010-06-05 NOTE — H&P (Signed)
[name] [name], [name] [name] NO.:  [id number]   MEDICAL RECORD NO.:  [id number]          PATIENT TYPE:  INP   LOCATION:  5532                         FACILITY:  [facility]   PHYSICIAN:  Santiago Bumpers. Hensel, M.D.DATE OF BIRTH:  10/09/1935   DATE OF ADMISSION:  02/01/2008  DATE OF DISCHARGE:                              HISTORY & PHYSICAL   PRIMARY CARE Deleon Passe:  Dr. Lequita Asal, [facility].   CHIEF COMPLAINT:  Coffee-ground emesis, nausea, and vomiting.   HISTORY OF PRESENT ILLNESS:  The patient is a 76 year old male with  multiple issues including history of peptic ulcer disease with an  esophageal ulcer disease with GI bleeding, Barrett esophagus, and  chronic obstructive pulmonary disease who presents with coffee-ground  emesis.  Per caretaker from group home, the patient has been having  nausea and vomiting for 1 week and epigastric pain despite treatment  with Phenergan.  Prior to admission, the patient has had persistent  nausea and vomiting and began to have a coffee-ground emesis, which was  witnessed in the ED as well.  The patient was unable to tolerate any  fluids by mouth.  The patient also was complaining of chest pain, which  is a chronic issue for him, which he said is relieved after he vomits.  The patient was recently evaluated by Cardiology as outpatient who do  not think further workup is warranted.  No record of any fever, chills,  cough, diarrhea, or constipation.  Chest pain is not radiating anywhere.  History taking is difficult secondary to the patient's mental  retardation.   MEDICATIONS:  1. Trazodone 50 mg p.o. nightly.  2. Benztropine 1 mg p.o. nightly.  3. Thioridazine  50 mg p.o. b.i.d.  4. Combivent 2 puffs b.i.d.  5. Colace 100 mg p.o. b.i.d.  6. MiraLax p.r.n.  7. Dyazide 37.5/25 p.o. daily.  8. Zocor 20 mg p.o. nightly.  9. Nexium 40 mg p.o. daily.  10.Iron sulfate 1 tablet p.o. daily.  11.Phenergan 25 mg p.o.  q.6 h. p.r.n. nausea.  12.Flovent 112 mcg 1 puff b.i.d.  13.Albuterol 2 puffs q.4 h. p.r.n. shortness of breath.   ALLERGIES:  PENICILLIN.   PAST MEDICAL HISTORY:  1. History of esophageal ulcer.  2. COPD.  3. History of aspiration pneumonia and community-acquired pneumonia on      multiplication occasions.  4. Hyperlipidemia.  5. Iron-deficiency anemia.  6. Psychosis, NOS.  7. Mental retardation.  8. Hypertension.  9. Hiatal hernia.  10.Gastroesophageal reflux disease.  11.Asthma.   FAMILY HISTORY:  Unable to obtain secondary to disability.   SOCIAL HISTORY:  The patient lives in a group home and attends [facility] [employer]  during the day.   REVIEW OF SYSTEMS:  As per HPI.   PHYSICAL EXAMINATION:  VITAL SIGNS:  Oxygen saturation 96% on room air,  temperature 97.0, heart rate 93, respiratory rate 18, blood pressure  139/82.  GENERAL:  The patient in no apparent distress, actively vomiting on.  Disheveled appearing.  Awake and alert.  HEENT:  No conjunctival pallor.  Extraocular movements intact.  Pupils  equal, round, and reactive to light and accommodation.  Moist mucous  membranes.  Poor dentition.  RESPIRATORY:  Normal respiratory effort:  No intercostal retractions or  accessory muscle use.  No crackles, no wheezes.  Occasional scattered  rhonchi and decreased breath sounds at the bases.  CARDIOVASCULAR:  Regular rate and rhythm with normal S1 and S2 without  murmurs, rubs, or gallops.  ABDOMEN:  Nontender to palpation.  No organomegaly.  Abdomen is soft  with no rebound or guarding.  Positive bowel sounds.  RECTUM:  Hemoccult negative.  PULSES:  2+ distal pulses.  EXTREMITIES:  No clubbing, cyanosis, or edema.  NEUROLOGIC:  Nonfocal.  Cranial nerves II through XII grossly intact.  SKIN:  Multiple areas of excoriation on bilateral lower extremities from  scratching.  PSYCHIATRIC:  The patient answers questions but will not verbalize on  his own.  Poor  cognition and understanding.  Poor eye contact.   LABORATORY DATA:  Lactic acid 2.5.  Sodium 134, potassium 3.6, chloride  92, bicarb 27, glucose 132, BUN 20, creatinine 0.95, T bili 0.6, alk  phos 82, AST 27, ALT 19, total protein 7.7, albumin 3.6, calcium 9.4.  White blood cell count 13.8, hemoglobin 14.3, hematocrit 44.3, platelets  299.  Hemoccult negative.   Chest x-ray:  Chronic lung disease, possibly superimposed active process  of left lower lobe with aspiration pneumonia.   EKG:  Normal sinus rhythm without ST elevation or depression.   ASSESSMENT AND PLAN:  The patient is a 76 year old male with multiple  medical issues including history of esophageal ulcer and esophageal  dysmotility who presents with coffee-ground emesis.  1. Gastrointestinal bleed:  The patient has a history of prior GI      bleed with esophageal ulcers on EGD.  Question of incomplete      healing versus recurrence versus gastritis.  Hemoglobin currently      stable.  Transaminases normal as well as platelets.  The patient      okay to monitor on telemetry only.  We will place two large bore      IVs, type and screen, check PT/INR and PTT.  We will check lipase.      Place the patient on telemetry.  We will make the patient n.p.o.      except for medications for now and start IV fluids.  In emergency      department, unable to pass NG tube after several attempts.  The      patient with tortuous esophagus and dysmotility on prior studies.      We will start Protonix 40 mg IV b.i.d.  We will check Helicobacter      pylori, immunoglobulins, and gastric emesis.  2. Chest pain:  Likely gastrointestinal source.  However, we will      cycle cardiac enzymes q.8 h. x3.  We will repeat EKG in the      morning.  3. Nausea and vomiting:  Unclear etiology.  Likely      gastroenteritisgiven prolonged and recurrent nature.  The patient      with history of esophageal dysmotility and hiatal hernia.      Electrolytes  stable.  We will schedule Zofran in a patient with      mental retardation and may not request it.  The patient will be      n.p.o. for now several minutes.  We will place on IV fluids of D5      half normal saline  125 mL/hr.  4. Leukocytosis/questionable infiltrate of chest x-ray:  The patient's      white blood cell count slightly improved in clinic on January 26, 2008.  The patient without fever or cough.  The patient with      extensive pulmonary fibrosis.  The patient received a dose of      azithromycin in the emergency department.  We will hold off on      further treatment for now as I do not feel the patient actually has      community-acquired pneumonia.  5. Hyperlipidemia:  Continue Zocor.  6. Hypertension.  Continue home medications.  7. Chronic obstructive pulmonary disease:  Continue home medications.  8. Fluids, electrolytes, nutrition/gastrointestinal:  IV fluids and      n.p.o.  9. Prophylaxis:  Proton pump inhibitor and sequential compression      devices.  No heparin or Lovenox for now with GI bleed.  10.Disposition:  May consult Gastrointestinal in a.m.  11.The patient has been previously evaluated by Dr. Matthias Hughs of [facility]      [facility].  12.Schizophrenia:  Continue thioridazine  and benztropine.       Lauro Franklin, MD  Electronically Signed      Santiago Bumpers. Leveda Anna, M.D.  Electronically Signed    TCB/MEDQ  D:  02/03/2008  T:  02/04/2008  Job:  [id number]

## 2010-06-05 NOTE — H&P (Signed)
[name] [name], [name] [name]               ACCOUNT NO.:  [id number]   MEDICAL RECORD NO.:  [id number]          PATIENT TYPE:  INP   LOCATION:  4728                         FACILITY:  [facility]   PHYSICIAN:  Leighton Roach McDiarmid, M.D.DATE OF BIRTH:  10/09/1935   DATE OF ADMISSION:  08/05/2007  DATE OF DISCHARGE:                              HISTORY & PHYSICAL   CHIEF COMPLAINT:  Vomiting and black stools.   HISTORY OF PRESENT ILLNESS:  This is a 76 year old male with a history  of hypertension, schizophrenia, and remote esophageal ulcer, who was  seen last week in clinic with some dizziness and vomiting.  It was  thought that he had a viral gastroenteritis.  He returns today for  persistent vomiting and now with weakness.  He is here with his  caretaker from [facility], as he is a poor historian himself.  Per the  caretaker, the patient has been vomiting 2-3 times daily.  There were 2-  3 days where he did not vomit.  His vomit does not contain blood nor it  is coffee ground like.  It does compose of food products only.  He has  also had decreased appetite and has not been taking fluids.  Per the  caregiver, he usually is obsessed with drinking liquids and now he is  not interested in drinking anything at all.  Last night, he had one  episode of black stool.  He was incontinent at this time, which is  somewhat unusual for the patient, though it does occasionally happen.  The patient has had abdominal pain over this course per the caretaker,  however, the patient himself denies this.  The patient has continued to  be dizzy with standing, sitting, and walking.  He becomes imbalanced  after he walks long distances.  This has been persistent since his last  office visit a week ago.  Of note, he has been weak for the past 2 days  and so weak in fact he was unable to pull his shirt over his head when  he was getting dressed this morning.  Of note, the patient was treated  for pneumonia, last month with  Avelox.  He did not require hospital  admission at that point.  I have reviewed the patient's chart and noted  that he had an esophageal ulcer in the past, unknown date and also has a  history of iron-deficiency anemia, although in the past.  His hemoglobin  has been greater than 15.  His caretaker states that he had a  colonoscopy last year that was normal.  The patient denies chest pain or  shortness of breath.   PAST MEDICAL HISTORY:  Difficult to obtain, but we used electronic  record to determine, if the patient has a history of esophageal ulcer,  unknown time frame, lichen planus, neurodermatitis, history of multiple  pneumonias including aspiration, as well as community acquired and  actually was intubated at some point, COPD, hyperglycemia,  hyperlipidemia, history of tachycardia, history of hypokalemia, iron-  deficiency anemia, schizophrenia, mental retardation, peptic ulcer  disease without obstruction, hypertension, hiatal hernia,  history of  GERD, and history of asthma.   PAST SURGICAL HISTORY:  Per an old history and physical that was  dictated, he had a laparotomy for a bleeding ulcer, however, we cannot  confirm this.   MEDICATIONS:  1. Combivent 2 puffs b.i.d.  2. Dyazide 37.5-25 daily.  3. Iron sulfate 325 daily.  4. Zocor 20 mg nightly.  5. Trazodone 50 mg at bedtime.  6. Benztropine 1 mg daily.  7. Thioridazine 50 mg b.i.d.  8. Cyclosporin eye drops daily.  9. Claritin 10 mg daily.  10.Klor-Con 20 mEq daily.  11.Ranitidine 150 b.i.d.   SOCIAL HISTORY:  The patient lives in [facility].  He attends  [employer] during the day time.  His caregiver is Shon Hough.  He used to smoke 1 pack a day until 15 years ago.  No alcohol.  Currently, no behavioral problems.  He sees Dr. __________ in Mental  Health and Dr. Madilyn Fireman for GI.   FAMILY HISTORY:  Unobtainable.   REVIEW OF SYSTEMS:  See history of present illness as well as he  complains  of fatigue, malaise, weakness, weight loss, abdominal pain,  change in bowel habits, dark tarry stools, nausea, vomiting, poor  balance, and pallor.  Denies chills, fever, sore throat, chest pain,  difficulty breathing, shortness of breath, wheezing, cough, bloody  stools, constipation, diarrhea, vomiting blood, rashes, or changes in  neurological status.   PHYSICAL EXAMINATION:  VITAL SIGNS:  Height 68 inches; weight 151.4,  this is down 3 pounds from July 9,2009; temp 99.7; heart rate 98; and  blood pressure 84/53.  Orthostatics obtained; lying 95/64 with a heart  rate of 86, sitting 91/60 with a heart rate of 89, and standing 92/52  with a heart rate of 101.  The patient was symptomatic during  orthostatic becoming dizzy.  GENERAL:  Not in acute distress, thin, but well appearing.  HEAD:  Normocephalic and atraumatic.  EYES:  Pupils are equal, round, and reactive to light.  He has pallor of  the conjunctiva.  NOSE:  No external deformity.  MOUTH:  Moist mucous membranes.  NECK:  No deformities.  No JVD.  LUNGS:  He has faint rhonchi in the left lower lobe, otherwise clear to  auscultation bilaterally.  No increased work of breathing.  No wheezes.  He has good air movement.  HEART:  Regular rate and rhythm.  No rubs, gallops, or murmurs.  ABDOMEN:  Soft and nondistended.  He has mild tenderness to palpation in  the left upper quadrant in the epigastric area.  No rebound.  No  guarding.  He has hypoactive bowel sounds.  RECTAL:  No hemorrhoids.  He has a normal prostate.  He has black tarry  stool present at the rectum.  MUSCULOSKELETAL:  No joint swelling.  PULSES:  He has normal radial and DP pulses bilaterally.  EXTREMITIES:  No edema.  NEUROLOGIC:  He is alert.  Cranial nerves II-XII intact.  SKIN:  He has normal turgor, although he is pale.  PSYCHIATRIC:  He answers questions, though will not verbalize on his  own, poor eye contact.   ASSESSMENT:  This is a 76 year old male  with what is likely a  gastrointestinal bleed.  1. Gastrointestinal bleeding.  Given the fact that the patient has      dizziness, pallor, weakness, and fecal occults are positive, we can      say that he has a likely gastrointestinal bleed.  Likely,  this has      occurred over the past few weeks, and he do not appear to be      acutely stressed or acutely bleeding out.  We will admit him for      workup and monitor to make sure that he is not acutely bleeding      severely.  Most likely, this is an upper gastrointestinal bleed,      possibly from an ulcer.  We will check a CBC and place him on a      tele bed in the hospital.  We are going to consult Dr. Madilyn Fireman of      [facility], as he will likely need an endoscopy and      also colonoscopy at some point.  We will type and cross 2 units,      and then obtain q.6 h. hemoglobin and hematocrit.  We will obtain      the PT and INR.  We will give him a bolus of 500 mL of normal      saline, and then continue on maintenance IV fluids.  Keep him      n.p.o. until we decide whether he needs the procedure or not.      Protonix IV b.i.d. will be started.  2. Chronic obstructive pulmonary disease.  Continue his Combivent.  He      is stable.  3. Vomiting.  We will put him on Zofran p.r.n., get his C-MET to      assess his electrolytes and keep him n.p.o.  He does not need an NG      tube at this time, as he is not vomiting currently.  4. Hyperlipidemia.  We will continue his Zocor.  5. Hypokalemia.  Given his history of hypokalemia and now vomiting, we      will check a BMET, as it likely is worsening.  6. Hypertension.  He is now hypotensive likely due to anemia from a      gastrointestinal bleed.  We are going to hold his blood pressure      medications.  Per his caretaker, his blood pressure runs in the 90s      systolic most of the times.  However, we will monitor this closely.      We will give IV fluid boluses, if the patient  has had poor p.o.      intake.  7. Dizziness.  Borderline orthostatic based on systolic blood pressure      change.  We are going to await his CBC, as he is likely anemic from      the gastrointestinal bleed.  We will give him a bolus of IV fluids.  8. Mental retardation, monitor.  9. Psychosis, schizophrenia.  According to our records, he is on      thioridazine and benztropine, which we will continue, although      please note that the thioridazine can cause hypotension.  I am      hesitant to stop that at this point as the patient has been well      controlled on his medications at the present time.  10.Peptic ulcer disease, Protonix IV b.i.d.  11.Prophylaxis, SCDs, and PPI.  12.Disposition.  Pending gastrointestinal workup.      Johney Maine, M.D.  Electronically Signed      Leighton Roach McDiarmid, M.D.  Electronically Signed    JT/MEDQ  D:  08/05/2007  T:  08/06/2007  Job:  [id number]

## 2010-06-05 NOTE — Discharge Summary (Signed)
NAMEYAEL, ANGERER NO.:  [id number]   MEDICAL RECORD NO.:  [id number]          PATIENT TYPE:  INP   LOCATION:  4728                         FACILITY:  [facility]   PHYSICIAN:  Leighton Roach McDiarmid, M.D.DATE OF BIRTH:  10/09/1935   DATE OF ADMISSION:  08/05/2007  DATE OF DISCHARGE:  08/07/2007                               DISCHARGE SUMMARY   PRIMARY CARE PHYSICIAN:  Johney Maine, M.D., at [facility].   DISCHARGE DIAGNOSES:  1. Subacute upper gastrointestinal bleed.  2. Dysphagia.  3. Anemia.  4. Hypokalemia.  5. Questionable history of schizophrenia.  6. Chronic obstructive pulmonary disease.  7. Mental retardation.  8. Hiatal hernia.  9. Gastroesophageal reflux disease.  10.Hyperlipidemia.   DISCHARGE MEDICATIONS:  1. Combivent 2 puffs t.i.d.  2. Dyazide 37.5/25 one p.o. daily.  3. Iron sulfate 325 mg daily.  4. Zocor 20 mg 1 tablet q.h.s.  5. Benztropine 1 mg one p.o. daily.  6. Thioridazine 15 mg b.i.d.  7. Cyclosporine eye drops daily.  8. Klor-Con 20 mEq daily.  9. Trazodone 50 mg p.o. nightly.  10.Protonix 40 mg p.o. b.i.d.  11.Zofran 4 mg every 6 hours as needed for nausea.   Please note that all p.o. meds should be crushed and given with a full  glass of fluid.  The patient should remain upright for 10 minutes after  given medication.   CONSULTATIONS:  GI.   PROCEDURES:  1. Upper endoscopy with biopsies on August 06, 2007.  Impression:  (1)      Large, deep, benign-appearing distal esophageal ulcer with dirty      base and stigma of recent hemorrhage, most likely the source of the      patient's recent drop in hemoglobin and heme-positive stool, a      smaller ulceration nearby.  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  (3) Patent, fairly wide esophageal ring in the      upper esophagus.  2. Barium swallow/esophagogram on  August 07, 2007.  Impression:  There      is a tortuous esophagus with a mild esophageal dysmotility.  A      stricture is noted in the distal esophagus.  Two probable      ulcerations are noted in the distal esophagus.  A small to moderate-      sized paraesophageal hernia is noted.  Abundant spontaneous      gastroesophageal reflux was noted to the level of the upper      esophagus.   Labs upon admission:  White blood cell count 12.3, hemoglobin 9.2,  hematocrit 28.7, platelet count 674.  Sodium 133, potassium 2.8,  chloride 95, CO2 29, glucose 98, BUN 7, creatinine 1.08, albumin 3.4,  calcium 8.7, bilirubin 0.6, alk phos 80, AST 22, ALT 21, total protein  6.9.  Cardiac enzymes were negative x3.  Labs upon discharge:  White  blood cell count 10.4, hemoglobin 10.5, hematocrit 32.2, platelet count  499.  Sodium 141, potassium 3.8, chloride 108, CO2 27, glucose 97, BUN  5, creatinine 0.95 with calcium of 8.   Mr. Frenette is a 76 year old man with a past medical history of  esophageal and stomach ulcers that presented with a GI bleed.   1. Subacute upper GI bleed.  The patient presented to the [facility] with weakness, dizziness, borderline      orthostatic hypotension, and was found have a hemoglobin of 9.6.      His last documented hemoglobin was in March 2009 and was greater      than 15.  He was given 2 units of packed red blood cells and      stabilized.  He underwent an upper endoscopy with biopsies the next      day and was noted to have a deep distal esophageal ulcer that      showed signs of recent hemorrhage and this was determined to be the      most likely source of the patient's bleed.  A barium swallow noted      a stricture in the distal esophagus as well as the previously-noted      ulcerations in the distal esophagus.  The barium swallow also      identified a small to moderate-sized paraesophageal hernia and that      there was abundant  gastroesophageal reflux up to the level of the      upper esophagus.  Mr. Spiller was discharged to his group home with      Protonix to take 40 mg twice daily, as well as Zofran to give every      6 hours as needed for nausea.  In the hospital he was also given a      trial of Carafate suspension.  Instructions were given for the      group home to please crush all of his pills and to give with a full      glass of water as well as to have him remain upright for 10 minutes      after taking.  2. Dysphagia.  Please see #1 and note that all meds are to be crushed      or given as an elixir, and he is to maintain a mechanical soft      diet.  3. Anemia.  As noted above, Mr. Desantis hemoglobin was 9.2 upon      admission to the hospital. Because he was symptomatic and this was      such a big change from his last know hemoglobin of 15, we decided      to transfuse 2 units.  Subsequent hemoglobin values jumped to 14      and then down to 10.4 and then again to 10.2.  We believe that this      14 was an outlier and that his hemoglobin has remained stable since      the transfusion.  He was discharged with iron to be crushed and      given daily.  4. Hypokalemia.  Upon admission, Mr. Tritschler potassium was at 2.8.      He was given three runs of 10 mEq of potassium as well as one 40      mEq of elixir potassium as he was unable to take pills, and this      was resolved upon his discharge.  5. Questionable history of schizophrenia.  As we  did not want to      change his at-home regimen, we continued the patient's Mellaril and      Cogentin but we have given them crushed.  6. Chronic obstructive pulmonary disease.  We maintained his home      regimen of Combivent.  7. Hyperlipidemia.  We continued crushed Zocor.  8. Mental retardation.  The patient was noted to be his own decision-      maker, and this was per his caregiver at the group home.   DISCHARGE INSTRUCTIONS:  Please crush all pills,  give with a full glass  of fluids and remain upright for 10 minutes.  Please follow up with Dr.  Madilyn Fireman at [facility] as well as your primary care physician, Dr. Karn Pickler.   DISCHARGE CONDITION:  Stable.      Helane Rima, MD  Electronically Signed      Leighton Roach McDiarmid, M.D.  Electronically Signed    EW/MEDQ  D:  08/07/2007  T:  08/07/2007  Job:  [id number]   cc:   [facility]

## 2010-06-05 NOTE — Discharge Summary (Signed)
NAMEJADON, HARBAUGH NO.:  [id number]   MEDICAL RECORD NO.:  [id number]          PATIENT TYPE:  OBV   LOCATION:  5702                         FACILITY:  [facility]   PHYSICIAN:  Leighton Roach McDiarmid, M.D.DATE OF BIRTH:  10/09/1935   DATE OF ADMISSION:  09/16/2006  DATE OF DISCHARGE:  09/17/2006                               DISCHARGE SUMMARY   DICTATED BY:  Alcide Evener.   PRIMARY CARE PHYSICIAN:  Dr. Verlon Setting.   REASON FOR ADMISSION:  Shortness of breath, congestion and dizziness.   PERTINENT ADMISSION LABS:  Included a white count of 18.9 with 69%  neutrophils.  Hemoglobin 15.1, hematocrit 44.9, platelets 369.  Sodium  132, potassium 3.6, chloride 95, bicarb 26, BUN 12, creatinine 0.92,  glucose 127.  Chest x-ray on admission showed left lower lobe pneumonia  and COPD changes.   PRIMARY DISCHARGE DIAGNOSES:  1. Pneumonia.  2. Chronic obstructive pulmonary disease.  3. Schizophrenia.  4. Mental retardation.   DISCHARGE MEDICATIONS:  1. Zocor 20 mg by mouth daily.  2. Combivent 2 puffs inhaled twice daily.  3. Ferrous sulfate 325 mg by mouth 3 times daily.  4. Potassium chloride 20 mEq by mouth daily.  5. Omeprazole 20 mg by mouth daily.  6. Triamterine/hydrochlorothiazide 37.5/25 1 tab by mouth daily.  7. Benztropine 1 mg by mouth daily.  8. Thioridazine 50 mg by mouth twice daily.  9. Trazodone 50 mg by mouth each evening.  10.Ranitidine 150 mg by mouth twice daily.  11.Phenergan 25 mg by mouth as needed for nausea.  12.Robitussin-DM as needed.  13.Avelox 400 mg by mouth daily for 7 days.  14.Prednisone 40 mg by mouth daily x7 days.      Neena Rhymes, M.D.  Electronically Signed      Leighton Roach McDiarmid, M.D.  Electronically Signed    KT/MEDQ  D:  09/17/2006  T:  09/17/2006  Job:  [id number]

## 2010-06-05 NOTE — Assessment & Plan Note (Signed)
[name] [name]                            CARDIOLOGY OFFICE NOTE   [name] [name], [name] [name]                      MRN:          [id number]  DATE:01/27/2008                            DOB:          10/09/1935    [name] [name] is here today for cardiac evaluation.  He is accompanied by  Josephina Gip and Leander Rams.  The patient has been losing weight.  He  has known significant esophageal disease.  He has been having chest pain  and he is referred for cardiac evaluation.  The patient has no known  documented coronary disease.  Today, when he was picked up to be brought  here, he complained of chest pain and pain all over.  While traveling  here, he had an episode of vomiting and then felt better.  He does not  have any significant exertional symptoms.  There is a history of  hyperlipidemia.   He was recently hospitalized at [facility].  He was treated for an upper  GI bleed and dysphagia.   PAST MEDICAL HISTORY:   ALLERGIES:  PENICILLIN.   MEDICATIONS:  Combivent, docusate, triamterene and hydrochlorothiazide,  simvastatin, potassium, iron, Flovent, Thioridazine, trazodone, and  benztropine.   OTHER MEDICAL PROBLEMS:  See the list below.   REVIEW OF SYSTEMS:  Review of systems analysis is difficult with this  patient.  He has some mental retardation.  He is scratching himself  continuously.  He has diffuse excoriations on his skin.   PHYSICAL EXAMINATION:  VITAL SIGNS:  Blood pressure is 114/72, his pulse  is 90, his weight is 158 pounds.  SKIN:  Excoriated from his diffuse scratching.  HEENT:  No xanthelasma.  He has normal extraocular motion.  NECK:  There are no carotid bruits.  There is no jugular venous  distention.  LUNGS:  Clear.  Respiratory effort is not labored.  CARDIAC:  S1 with an S2.  There are no clicks or significant murmurs.  ABDOMEN:  Soft.  EXTREMITIES:  He has no significant peripheral edema.   EKG today shows mild increased  voltage.  There are no diagnostic  abnormalities.  There is normal sinus rhythm.   Problems include,  1. Abnormal chest x-ray with chronic lung disease by chest x-ray.  2. Recent hospitalization with subacute upper gastrointestinal bleed.  3. Dysphagia.  4. Anemia.  5. Schizophrenia.  6. Mental retardation.  7. Gastroesophageal reflux disease.  8. Hyperlipidemia.  9. *Chest discomfort.   At this point, there is no definite evidence of ischemic heart disease.  There is no significant EKG change.   PLAN:  I am recommending no further cardiac workup.  I believe that  further assessment of his GI tract is certainly appropriate.  I do not  feel that exercise testing would be appropriate in this setting.  I have  considered a 2D echo.  His EKG at this point is normal.  I have chosen  not to proceed with any other testing.     Luis Abed, MD, [facility]  Electronically Signed    JDK/MedQ  DD: 01/27/2008  DT: 01/28/2008  Job #: [id number]   cc:   William A. Leveda Anna, M.D.

## 2010-06-05 NOTE — Consult Note (Signed)
NAMECAP, MASSI NO.:  [id number]   MEDICAL RECORD NO.:  [id number]          PATIENT TYPE:  EMS   LOCATION:  MAJO                         FACILITY:  [facility]   PHYSICIAN:  Nestor Ramp, MD        DATE OF BIRTH:  10/09/1935   DATE OF CONSULTATION:  04/05/2008  DATE OF DISCHARGE:                                 CONSULTATION   PRIMARY CARE Liylah Najarro:  Lauro Franklin, MD, [facility].   PRIMARY GASTROENTEROLOGIST:  Bernette Redbird, MD   CHIEF COMPLAINT:  Nausea, vomiting, and abdominal pain.   HISTORIAN:  Caretaker, Leander Rams, Interior and spatial designer of [facility].   HISTORY OF PRESENT ILLNESS:  A 76 year old male with multiple  comorbidities presented to ED with acute episode of abdominal pain.  Caretaker providing history which is incomplete if he is not the direct  care giver for the patient.  The patient with history of mental  retardation, therefore, not answering all questions appropriately.  Yesterday around noon, the patient complained of abdominal pain to group  home staff.  Location is unknown, character is unknown as the patient  currently denies any history of abdominal pain.  Abdominal pain per  report was associated with nausea and vomiting, and per care giver,  vomited 3 times yesterday, which was brown in color and several times  throughout the night.  Of note, the patient denies any vomitus.  Denies  fever or diarrhea.  Admits to decreased p.o. intake since Sunday.  Per  care giver did not much of dinner last night or breakfast this a.m.  Last bowel movement recorded by group home was yesterday, which was  nonbloody.  Of note, the patient also recently evaluated by Dr. Matthias Hughs  for followup on esophagitis, which has resolved.   REVIEW OF SYSTEMS:  No recent illness.  No sick contacts.  Positive  cough with white sputum production.  No body aches.  Questionable chest  pain, worse with epigastric pain yesterday.  All else  normal except as  stated in HPI.   PAST MEDICAL HISTORY:  1. COPD.  2. Mental retardation.  3. Hypertension.  4. Dementia.  5. GERD.  6. Anemia-iron deficiency.  7. Schizophrenia.  8. History of esophageal ulcer, esophagitis.  9. Hiatal hernia.   PAST SURGICAL HISTORY:  Splenectomy, date unknown.   EGD, March 2010, 98% resolution of previous severe esophagitis.   ALLERGIES:  PENICILLIN.   MEDICATIONS:  1. Benztropine 1 mg p.o. nightly.  2. Flovent 2 puffs b.i.d.  3. Thioridazine 50 mg q.a.m., 100 mg q.p.m.  4. Trazodone 50 mg nightly.  5. Triamterene/hydrochlorothiazide 37.5/25 mg daily.  6. Simvastatin 20 mg daily.  7. Docusate sodium 100 mg b.i.d.  8. KCl 20 mEq daily.  9. Albuterol 90 mcg 2 puffs q.4 h. p.r.n.  10.Iron sulfate 325 mg p.o. daily.  11.Zofran 8 mg q.8 h. p.r.n. nausea, vomiting.  12.Combivent 2 puffs inhaled b.i.d.  13.Omeprazole 40 mg p.o. b.i.d.  14.Carafate 1 g q.i.d.  15.MiraLax 17 g b.i.d. p.r.n. constipation.  16.Bactroban 2% cream apply to  affected areas t.i.d.  17.Ensure supplemental 6 b.i.d.   SOCIAL HISTORY:  The patient lives at [facility],  phone number is [phone].  Denies EtOH.  Denies tobacco.  Denies  illicit.  Of note, history of tobacco use, however, the patient did not  quantify.   FAMILY HISTORY:  Unable to obtain.   PHYSICAL EXAMINATION:  VITAL SIGNS:  Temperature 99.4, respiratory rate  20, BP range 111-131/68-82, heart rate 81-97, and O2 sat 93% on room  air.  GENERAL:  No acute distress.  Alert and oriented x3.  HEENT:  PERRL, nonicteric.  Pink conjunctiva.  EOMI.  Oropharynx clear.  Moist mucous membranes.  Poor dentition.  CVS:  Regular rate and rhythm.  No murmurs.  RESPIRATORY:  Bibasilar crackles/rales.  No wheezing.  Upper lung fields  clear to auscultation bilateral.  ABDOMEN:  Normoactive bowel sounds.  Soft, nontender, nondistended.  Healed surgical scars in left upper quadrant.  No rebound  tenderness.  No hepatomegaly.  Negative Murphy sign.  EXTREMITIES:  Pulses 2+.  No edema.  Excoriations on right lower  extremity.  NEUROLOGIC:  Follows commands.  Impaired mentation as history of MR,  moving all 4 extremities.  No focal deficits.  Per the patient's care  giver, mental status is at baseline.   LABORATORY DATA:  Urinalysis within normal limits.  Lipase 18.  BMET:  Sodium 133, potassium 3.1, chloride 92, CO2 31, BUN 14, creatinine 0.96,  glucose 106, T-bili 0.4, alk phos 81, total protein 7.3, AST 33, ALT 26,  albumin 3.5, and calcium 8.8.  CBC:  White count 20.2, hemoglobin 12.4,  hematocrit 36.9, platelets 425, neutrophils 77%, lymphocytes 14%, and  ANC 15.6.   IMAGINGS:  1. CT of the abdomen and pelvis, impression:  No acute process of      pelvis.  2. Hiatal hernia with potential distal esophageal diverticulum.  No      acute abdominal process.  High densely material within gallbladder      fundus, stones versus sludge.  No evidence of cholecystitis.  3. Acute abdominal series, bilateral, bibasilar atelectasis or      infiltrate.  No free air.  Stool throughout colon, nonspecific      nonobstructive bowel gas pattern.  4. Abdominal ultrasound.  Multiple gallstones.  No diltation of common      bile ducts being removed.   ED COURSE:  Given KCl 40 mEq x1.   ASSESSMENT AND PLAN:  A 76 year old male with mental retardation,  chronic obstructive pulmonary disease, history of esophageal ulcer,  Barrett esophagus, presents to emergency department, status post acute  episode of abdominal pain.  1. Abdominal pain.  Upon evaluation of the patient in ED, pain      resolved.  Physical exam unremarkable.  Ultrasound shows multiple      gallstones and it is possible that the patient had some obstruction      yesterday and passed gallstones.  Currently, without any evidence      of cholecystitis.  The patient afebrile and negative physical exam.      No current nausea,  vomiting, was able to tolerate water; however,      did not have any further p.o. challenge in ED.  Other differentials      include a gastroenteritis, but no evidence of acute inspection      currently.  It is of note that the patient did have increased white      count, which could possibly  be from a gastroenteritis, questionable      pulmonary infection or with the gallstones.  Other differentials      include food poisoning, however, no such history given as well as      constipation; however, that this would not be consistent with the      patient's increased white count.  We will allow the patient to go      home at this time, follow up with primary care Takerra Lupinacci in the      a.m., may need surgical referral for cholecystectomy if pain      recurs.  2. Hyperkalemia.  Repleted in emergency department.  We will recheck      BMET in clinic.  The patient also on p.o. supplement daily      secondary to antihypertensives and diuretics.  3. Chronic obstructive pulmonary disease.  The patient with cough for      past few days, however, afebrile and no change in sputum.  X-rays      and exam consistent with atelectasis, other possible infiltrate.      At this point, we will not treat the patient and continue home COPD      regimen.  O2 sat greater than 90% on room air and other possibility      that this is acute viral illness.  4. Hypertension.  Blood pressure stable.  Continue home meds.  5. Leukocytosis.  Unclear etiology, possibly from gallbladder etiology      versus pulmonary, viral, or bacterial infection.  The patient is      stable on physical exam, unremarkable.  We can continue with      discharge at this point.  Of note, other causes of infection such      as urinary tract infection ruled out at this time, and the patient      has no evidence of infection on the skin.  6. Disposition.  Discharge home.      Milinda Antis, MD  Electronically Signed      Nestor Ramp, MD   Electronically Signed    KD/MEDQ  D:  04/06/2008  T:  04/06/2008  Job:  [id number]

## 2010-06-05 NOTE — Op Note (Signed)
[name] [name], SUDBECK NO.:  [id number]   MEDICAL RECORD NO.:  [id number]          PATIENT TYPE:  AMB   LOCATION:  ENDO                         FACILITY:  [facility]   PHYSICIAN:  Bernette Redbird, M.D.   DATE OF BIRTH:  10/09/1935   DATE OF PROCEDURE:  03/29/2008  DATE OF DISCHARGE:                               OPERATIVE REPORT   PROCEDURE:  Upper endoscopy with biopsies.   INDICATIONS:  A 76 year old gentleman for followup of refractory  esophagitis, improved, but not resolved, at the time of his repeat  endoscopy 2 months ago.  He remains on omeprazole 40 mg b.i.d.   FINDINGS:  Free reflux, but marked improvement in esophagitis with  nearly complete healing.  Long segment Barrett esophagus.   PROCEDURE:  The patient was familiar with the procedure and provided  written consent.  Sedation was fentanyl 50 mcg and Versed 5 mg IV  without clinical instability.  The patient had to come as an outpatient  to the [facility] endoscopy area.  The Pentax adult video endoscope was  passed under direct vision.  The larynx was not well seen, but no gross  abnormalities were seen.   The esophagus was entered.  Beginning at 20 cm, there was  circumferential Barrett esophagus all the way down to the lower  esophageal sphincter which was located at about 35 cm with a several  centimeter hiatal hernia below it, along with some degree of deformity.  The main finding on this examination was virtually complete resolution,  approximately 98% improvement, with just one focal area of residual  ulceration measuring about 4 mm across.  No stricture formation was  present, although there were several ring-like narrowings within the  esophageal lumen, none of which was particularly tight.  There was free  reflux of clear gastric contents up into the distal and midportion of  the esophagus.   As noted, a several centimeter hiatal hernia was present.   The stomach contained a small clear  residual but was otherwise  essentially normal without significant gastritis, erosions, ulcers,  polyps, or masses including a retroflexed view the cardia where it  almost looked like the patient had had a prior wrap around the distal  esophagus.   The pylorus, duodenal bulb, and second duodenum looked normal.   Extensive random biopsies of the Barrett segment of the esophagus were  obtained prior to removal of the scope.  Approximately 20 specimens were  obtained in all.   The patient tolerated the procedure well, and there were no apparent  complications.   IMPRESSION:  1. A 98% resolution of previous severe esophagitis.  2. Long segment Barrett esophagus without any esophageal mass or      stricture evident.  3. Free reflux noted.   PLAN:  1. Continue current medication, omeprazole 40 b.i.d.  2. Await pathology results.           ______________________________  Bernette Redbird, M.D.     RB/MEDQ  D:  03/29/2008  T:  03/29/2008  Job:  [id number]   cc:   William A. Leveda Anna, M.D.

## 2010-06-05 NOTE — Discharge Summary (Signed)
[name] [name], [name] NO.:  [id number]   MEDICAL RECORD NO.:  [id number]          PATIENT TYPE:  OBV   LOCATION:  5702                         FACILITY:  [facility]   PHYSICIAN:  [name] [name], M.D.DATE OF BIRTH:  10/09/1935   DATE OF ADMISSION:  09/16/2006  DATE OF DISCHARGE:  09/17/2006                               DISCHARGE SUMMARY   DICTATED FOR:  [name] [name], M.D.  and [name] [name], M.D.  (PGY3)   PRIMARY CARE PHYSICIAN:  [name] Setting, MD   REASON FOR ADMISSION:  Shortness of breath, increased work of breathing,  and dizziness.   PERTINENT ADMISSION LABS INCLUDE:  A white count of 18.9, 69%  neutrophils.  Hemoglobin 15.1, hematocrit 44.9, platelets 369, MCV 92.6.  Sodium of 132, potassium 3.6, chloride 95, bicarb of 26.  BUN of 12,  creatinine 0.92, glucose 127.  Pertinent studies on admission included a  chest x-ray, which showed a left lower lobe infiltrate and changes  consistent with COPD.   PRIMARY DISCHARGE DIAGNOSES:  1. Left lower lobe pneumonia.  2. Chronic obstructive pulmonary disease.  3. Mitral regurgitation.  4. Schizophrenia.  5. History of deficiency anemia.   DISCHARGE MEDICATIONS:  Include the following home medications, which  have not been changed,  1. Zocor 20 mg by mouth daily.  2. Combivent 2 puffs inhaled twice daily.  3. Ferrous sulfate 325 mg by mouth three times daily.  4. Potassium chloride 20 mEq by mouth daily.  5. Omeprazole 20 mg by mouth daily.  6. Triamterene/hydrochlorothiazide 37.5/25 one tab by mouth daily.  7. Benzopyrine 1 mg by mouth daily.  8. Thioridazine 50 mg by mouth twice daily.  9. Trazodone 50 mg by mouth each evening.  10.Ranitidine 150 mg by mouth twice daily.  11.Phenergan 25 mg p.o. as needed for nausea.  12.Robitussin DM suspension 1 teaspoon by mouth q.4 h. as needed.  13.Eucerin cream applied to skin and face twice daily.   NEW MEDICATIONS INCLUDE:  1. A prescription for  Avelox 400 mg by mouth daily x7 days.  2. Prednisone 40 mg by mouth daily x7 days.   BRIEF HOSPITAL COURSE:  The patient was admitted with a presumed ammonia  after having been unsteady on his feet, dizzy, and with mental status  changes earlier in the day.  The dizziness and mental status changes had  resolved prior to his admission.  The patient was begun on Rocephin and  azithromycin in the emergency room; and was admitted for 23-hour  observation.   On the day following admission, the patient was saturating in the low-  90s on room air and was in no distress.  After ambulation, he saturated  briefly at 88% on room air and quickly came up to 90%.  The patient's  baseline oxygen saturation in the [name] of COPD is unknown.  With  these oxygen saturations the patient is in no distress, and reports no  trouble with his breathing.  He does not like to wear oxygen, and states  that it does not help him at all.  In anticipation of discharge,  his  antibiotics were changed to Avelox, a 7-day course, which he will be  sent home with.  Due to wheezing heard on exam on the day of discharge,  he was also begun on a prednisone burst 40 mg by mouth daily for 7 days.   On admission the patient had reported 3 days of diarrhea, however, this  problem had resolved prior to admission.  The patient had a normal bowel  movement while an inpatient and had no complaints of diarrhea.   The patient's ongoing health issues including his MR, schizophrenia, and  iron deficiency were treated according to the patient's home regimen  during inpatient admission and were no cause for acute concern.   PERTINENT DISCHARGE LABS INCLUDE:  A white count of 15.6, hemoglobin of  13, hematocrit 38.5, platelets 354.  Sodium 136, potassium 3.1 (this was  repleted over the course of the day of discharge with two times 30 mEq  of KCl), chloride 100, bicarb 29, BUN 9, creatinine 0.93, glucose 106,  calcium 8.7.   CONDITION  AT DISCHARGE:  Stable.   PENDING TESTS INCLUDE:  Blood cultures.   DISPOSITION:  The patient will be discharged back to his group home.   FOLLOWUP APPOINTMENTS:  An appointment is scheduled with his PCP, Dr.  Okey Dupre, for September 2 at 2:30 p.m.   FOLLOW UP ISSUES:  When the patient arrived he was saturating in the low-  90s.  He continued to saturate in the low-90s throughout the course of  his admission, except after ambulation when he would briefly saturate in  the high 80s, but would quickly come up to the low 90s.  He does not  appear uncomfortable at these oxygen saturations, and was quite anxious  to go home.  He will be seen within the week by his primary care  [name] [name] to evaluate progression of his pneumonia.  Blood cultures can  be followed up at that time.   Dictated by [name] [name], Fourth Year Medical Student      [name] [name], M.D.  Electronically Signed      [name] [name], M.D.  Electronically Signed   KT/MEDQ  D:  09/17/2006  T:  09/17/2006  Job:  [id number]

## 2010-06-05 NOTE — H&P (Signed)
[name] [name], UNGARO NO.:  [id number]   MEDICAL RECORD NO.:  [id number]          PATIENT TYPE:  OBV   LOCATION:  5702                         FACILITY:  [facility]   PHYSICIAN:  Nestor Ramp, MD        DATE OF BIRTH:  10/09/1935   DATE OF ADMISSION:  09/16/2006  DATE OF DISCHARGE:                              HISTORY & PHYSICAL   PRIMARY CARE PHYSICIAN:  Dr. Corliss Marcus at the [facility].   CHIEF COMPLAINT:  Wheezing, congestion, and dizziness.   HISTORY OF PRESENT ILLNESS:  Mr. [name] [name] is a 76 year old male with known  schizophrenia and moderate mental retardation who was seen today at the  [facility] for wheezing, congestion, and  dizziness.  In that office visit, he was accompanied by a caseworker  from his group home where he lives and presented with congestion and  nonproductive cough, shortness of breath, and a feeling of being wobbly  on his feet since about lunch time today.  The patient was taking out  the trash and became short of breath, and a nurse from his group home  listened to his lungs and thought he was quite wheezy.  His temperature  at that time was 99.5, and they gave him some Tylenol.  The dizziness  resolved as soon as he sat down and caught his breath.  He has been  complaining of mild congestion for a few days with cough and  intermittent chills but no documented fevers.  In the office, he was not  able to answer many questions, but the caseworker was able to give most  of his history.  The caseworker reported some stumbling and shortness of  breath currently, which is very similar to the presentation he had in  April 2008, at which time he had community-acquired pneumonia and was  hospitalized.  At that time, a chest x-ray taken after hospitalization  was clear, showing resolution of the pneumonia.  Currently, per the  caseworker's report, the patient is at his mental and physical baseline  and not stumbling or confused.   REVIEW OF SYSTEMS:  Positive for chills, congestion, and diarrhea for  about 3 days.  It is negative for nausea, vomiting, rash, dysuria,  palpations, and chest pain.   PAST MEDICAL HISTORY:  1. The patient has a history of an esophageal ulcer.  2. He has a history of a positive PPD but negative chest x-ray.  3. He has a history of iron deficiency anemia.  4. He has multiple histories of pneumonia, intubated once in 1998.  No      further intubations since last hospitalization in April 2008.  5. Hyperlipidemia.  6. Hypertension.  7. Asthma.  8. Mental retardation.  9. Schizophrenia.   MEDICATIONS:  1. Zocor 20 mg daily.  2. Combivent 1 puff b.i.d.  3. Ferrous sulfate 325 mg by mouth t.i.d.  4. Potassium chloride 20 mEq daily.  5. Omeprazole 20 mg daily.  6. Triamterene/HCTZ 37.5/25 daily.  7. Benztropine 1 mg daily.  8. Trazodone 50 mg  q.h.s.  9. Thioridazine 50 mg q.a.m., 100 mg q.p.m.   FAMILY HISTORY:  Noncontributory.   SOCIAL HISTORY:  The patient does live in a group home.  He attends an  adult education center for enrichment during the daytime.  His caregiver  is Maralyn Sago.  He did smoke 1 pack a day until 15 years ago, when he  entered the group home.  He does not use alcohol.  He does see Brock Bad for mental health issues, and Dr. Madilyn Fireman is his  gastroenterologist.   PHYSICAL EXAM:  VITAL SIGNS:  Temperature 97.5, heart rate 100,  respiratory rate 24, blood pressure 132/85, saturating 90% on room air.  GENERAL:  The patient is in no acute distress, resting comfortably on  the gurney.  He does speak in complete sentences.  He is alert and at  his baseline orientation.  He can say he is at the hospital.  He knows  his name, but he does not know the date.  His caseworker states that he  would not ever be able to tell us the date, that this is his baseline.  HEENT:  He is normocephalic, atraumatic.  Pupils are equal,  round,  reactive to light.  TMs are pearly gray bilaterally with good reflex.  Mouth shows mucous membranes are moist.  Oropharynx clear.  The patient  does have poor dentition, missing several teeth.  NECK:  Has full range of motion without lymphadenopathy.  LUNGS:  Shows decreased breath sounds at the left base and left mid area  and wheezing on the right.  Normal respiratory effort without use of  accessory muscles.  HEART:  Regular rate and rhythm without murmurs, rubs, or gallops.  ABDOMEN:  Soft, nontender, nondistended.  No organomegaly and positive  bowel sounds.  EXTREMITIES:  There is no edema in lower extremities, 2+ pulses in  radial pulses as well as dorsalis pedis.  Brisk capillary refill.  NEUROLOGIC EXAM:  Gait is normal.  Strength is equal bilaterally, and  sensation is intact.  SKIN:  Normal turgor, normal color, and no rashes.   LABORATORY:  White blood cell count is 18.9, hemoglobin 15.1, platelets  of 369.  He does have an absolute neutrophil count of 13.1.  A BMET  shows a sodium slightly low at 132, potassium at 3.6, creatinine is  0.92, and a glucose of 127.  Studies of chest x-ray does show a left  lower lobe infiltrate.  Also of note, an EKG was taken that did show Q  waves in leads I, II, and V4 through V6.  This is consistent with and  unchanged from an EKG taken on May 05, 2006.   ASSESSMENT AND PLAN:  This is a 76 year old male with known mental  retardation and schizophrenia who lives in a group home.  He presents  today with community-acquired pneumonia.  1. Community-acquired pneumonia:  We will admit the patient for 23-      hour observation status.  We will start Rocephin 1 g IV daily and      azithromycin 500 mg IV daily and change to oral medication tomorrow      if the patient remains afebrile and otherwise does well overnight.      He will continue all home meds and add albuterol nebulizer      treatment every 6 hours p.r.n. with one dose now  in the emergency      room, and he will get oxygen by nasal cannula  for saturations less      than 88%.  However, he is saturating okay at approximately 90% in      the emergency department.  We do question if his altered mental      status earlier was secondary to low oxygen saturation - his altered      mental status earlier in the day.  We will check a urine strep      pneumo antigen as well as a urine Legionella antigen.  We will also      get a urinalysis to look for possible infection.  We will repeat a      CBC and BMET in the morning.  2. Fluids, electrolytes, and nutrition:  We will order a heart-healthy      diet.  We will not start fluids at this time, as the patient is      taking p.o. and does not appear to be dehydrated.   DISPOSITION:  We will admit the patient as stated earlier, for 23-hour  observation, discharge pending.  Transition to p.o. meds in  demonstration that the patient can maintain his oxygen saturations at  rest and with ambulation off oxygen.  If he does improve, we will  consider steroids, as this could possibly be an asthma exacerbation.  However, his clinical lung exam, his x-ray, as well as his blood work  are consistent with community-acquired pneumonia.      Ardeen Garland, MD  Electronically Signed      Nestor Ramp, MD  Electronically Signed    LM/MEDQ  D:  09/16/2006  T:  09/17/2006  Job:  [id number]

## 2010-06-05 NOTE — Op Note (Signed)
[name] [name], [name] [name] NO.:  [id number]   MEDICAL RECORD NO.:  [id number]          PATIENT TYPE:  INP   LOCATION:  4728                         FACILITY:  [facility]   PHYSICIAN:  Bernette Redbird, M.D.   DATE OF BIRTH:  10/09/1935   DATE OF PROCEDURE:  08/06/2007  DATE OF DISCHARGE:                               OPERATIVE REPORT   PROCEDURE:  Upper endoscopy with biopsies.   INDICATIONS:  A 76 year old mentally retarded male with history of  recurrent anemia, who underwent colonoscopy with a poor prep and a  barium enema about a year ago through Dr. Madilyn Fireman, with no significant  findings uncovered.  Subsequent severe anemia was treated with iron.  The patient's baseline hemoglobin more recently had been about 15, but  he presented to the [facility] yesterday, weak orthostatic,  Hemoccult-positive, and with a roughly 6 grams drop in hemoglobin and  was admitted to the hospital.  He is not on ulcerogenic medications and  does take omeprazole 20 mg daily as an outpatient.   PROCEDURE IN DETAIL:  The procedure had been discussed with the patient  who provided written consent.  He had been brought from this hospital  room to the endoscopy unit.  Sedation was Fentanyl 40 mcg and Versed 5  mg IV without clinical instability.  The patient was a little bit  restless during the procedure, but overall tolerated reasonably well.   The Pentax video adult endoscope was passed under direct vision.  The  vocal cords looked normal.   Efforts to pass the scope under direct vision into the esophagus were  impeded by sub-cricopharyngeal stenosis.  The scope finally popped  through that area and subsequent examination showed fracture of a tight  ring-like narrowing in that area.   Several centimeters distal to this area was a fairly widely patent  esophageal ring.  There was slight mucosal irregularity in the upper  third of the esophagus, but no frank inflammation.  Some  biopsies were  obtained from that area toward the end of the procedure.   In the distal esophagus, there was marked deformity, with a sort of  serpentine or sigmoid contour.  At the top of this sigmoid  configuration, was a very large deep esophageal ulceration with a dirty  base and even red dot consistent with being a stigma of recent  hemorrhage.  This ulcer was estimated to be approximately 4-5 cm in  diameter and occupied about half of the circumference of the esophagus.  Nearby, slightly distal to it, was a smaller ulcer measuring about 1.5  cm in diameter.  There was no frank esophageal stenosis in this area  even though the scope had to be maneuvered to get through the baffle-  like configuration of the esophageal wall.  No overt hiatal hernia was  appreciated.   The stomach was entered.  It contained bilious residual and had normal  mucosa without evidence of gastritis, erosions, ulcers, polyps or  masses.  A retroflex view of the cardia was unremarkable.  Other than  that, there did appear to be either  a fundic cap that was surrounded by  diaphragm or perhaps a small paraesophageal hiatal hernia.  It was  explored in retroflexed viewing and was noted to be free of any  inflammatory change or ulceration.   The pylorus was normal.  The duodenal bulb seemed elongated, but  otherwise normal and the second duodenum looked normal.  Duodenal  biopsies were obtained to help rule out celiac disease in view of the  patient's history of psychiatric disturbances and anemia.   The scope was then removed from the patient.  There were no apparent  complications.   IMPRESSION.:  1. Large deep benign-appearing distal esophageal ulcer with dirty base      and stigma of recent hemorrhage, most likely the source of the      patient's recent drop in hemoglobin and heme-positive stool,      smaller ulceration nearby.  2. Proximal cervical esophageal tight stricture just below the       cricopharyngeal, dilated and fractured by passage of scope.  3. Patent fairly wide esophageal ring in the upper esophagus.   PLAN:  1. Await pathology on biopsies.  2. Barium swallow with tablet to define the anatomy better and      ascertain functional status.  3. Increase PPI to twice daily dosing.  4. Trial of sucralfate suspension.  5. Try to take all medications in elixir or crushed form if possible      and try to avoid swallowing large tablets.           ______________________________  Bernette Redbird, M.D.     RB/MEDQ  D:  08/06/2007  T:  08/07/2007  Job:  [id number]   cc:   Johney Maine, M.D.

## 2010-06-05 NOTE — Op Note (Signed)
[name] [name], DEERMAN NO.:  [id number]   MEDICAL RECORD NO.:  [id number]           PATIENT TYPE:   LOCATION:                                 FACILITY:   PHYSICIAN:  John C. Madilyn Fireman, M.D.    DATE OF BIRTH:  10/09/1935   DATE OF PROCEDURE:  02/04/2008  DATE OF DISCHARGE:                               OPERATIVE REPORT   INDICATIONS FOR PROCEDURE:  The patient is well known to our practice  with multiple esophageal issues with 4-5 cm esophageal ulcer diagnosed  with negative biopsies by Dr. Matthias Hughs in July 2009.  He has been  followed by Dr. Matthias Hughs in the office in November and has a followup  appointment with them in January.  He has presumably been on Nexium at  least once a day.  He also has known Barrett esophagus with no dysplasia  on the biopsies from back in July.  He was admitted with coffee ground  emesis and initially it was decided not to work this up, but he had  another bout and then decided to call us for endoscopy.   PROCEDURE:  The patient was placed in the left lateral decubitus  position and placed on the pulse monitor with continuous low-flow oxygen  delivered by nasal cannula.  He was sedated with 75 mcg IV fentanyl and  7.5 mg IV Versed.  The Olympus video endoscope was advanced under direct  vision into the oropharynx and esophagus.  The esophagus was tortuous,  and at 18 cm there was a concentric ring that did not present any  significant resistance to passage of the scope beyond it, but was  estimated to be narrow to approximately 11 mm.  Just distal to this ring  appeared to be the squamocolumnar line indicating a long segment of  Barrett esophagus.  The esophagus was tortuous and capacious with some  particles of partially digested food found in dependent areas, but the  mucosa was normal down to about 31 cm where there was seen an  approximately 1 cm ulcer with a relatively clean base with a few small  dirty areas, but no visible vessel.   No other stigma of hemorrhage.  This corresponded in described location to the ulcer that Dr. Matthias Hughs  had seen in July 2009, but was described then as much larger and  occupying about half the circumference of the esophagus.  I did not take  any biopsies at this time.  There appeared to be a mild noninflamed  stricture just distal to the ulcer, again with no resistance to passage  of the scope beyond it.  He was difficult to tell where the true  anatomic gastroesophageal junction was, but his diaphragmatic hiatus was  at about 36 cm, and he was known to have a hiatal hernia with a fair  esophageal component by barium swallow in July.  There was no visible  suspicion of neoplasm in any the areas examined.  The stomach was  entered, and small amount of liquid secretions were suctioned from the  fundus.  Retroflexed view of cardia confirmed  a hiatal hernia was  otherwise unremarkable.  The fundus, body, antrum, and pylorus all  appeared normal.  The duodenum was entered, and both the bulb and second  portion were well inspected and appeared be within normal limits.  Scope  was then withdrawn, and the patient returned to the recovery room in  stable condition.  He tolerated the procedure well, and there were no  immediate complications.   IMPRESSION:  1. Persistent esophageal ulcers smaller than described at last      endoscopy.  2. Barrett esophagus, probably of at least 6 or 7 cm in length with no      visible suspicious areas.  3. Hiatal hernia.  4. Tortuous dilated esophagus.   PLAN:  I think the patient is likely safe for discharge from the  standpoint of GI bleeding and should be kept on double-dose proton pump  inhibitor and probably Carafate.  He has an appointment with Dr. Matthias Hughs  on February 18, 2008, for followup, and I suspect might benefit from one  additional followup endoscopy to assess healing in 2-3 months or simply  treat him empirically until his next Barrett EGD is  due.           ______________________________  Everardo All. Madilyn Fireman, M.D.     JCH/MEDQ  D:  02/04/2008  T:  02/05/2008  Job:  [id number]   cc:   Bernette Redbird, M.D.  William A. Leveda Anna, M.D.

## 2010-06-05 NOTE — Consult Note (Signed)
NAMEMYCAL, CONDE NO.:  [id number]   MEDICAL RECORD NO.:  [id number]          PATIENT TYPE:  INP   LOCATION:  4728                         FACILITY:  [facility]   PHYSICIAN:  Bernette Redbird, M.D.   DATE OF BIRTH:  10/09/1935   DATE OF CONSULTATION:  08/06/2007  DATE OF DISCHARGE:                                 CONSULTATION   We were asked to see Mr. Hitchens today in consultation for vomiting,  black stool and anemia by Dr. Tawanna Cooler McDiarmid.   HISTORY OF PRESENT ILLNESS:  This is a 76 year old mentally retarded  gentleman with a history of esophageal ulcer.  Whose PCP reports of  present history of abdominal pain, black tarry stool, nausea, vomiting,  weakness and dizziness.  His tarry stools started on August 04, 2007.  His  other symptoms seemed to have persisted for several weeks since his last  hospitalization for pneumonia.  The patient is not a good historian, but  he does tell me that he has been vomiting for over a month and he is  very hungry.  His last colonoscopy was done in February 2008, it was  found to be normal he also had a followup barium enema in February 2008  that was found to be normal.  It is noteworthy that in April 2008, he  had a hemoglobin of 7.5 over hematocrit of 24; at that point, the  patient did not receive a further GI workup but was placed on iron.  He  was also given 2 units of packed red blood cells.  His care giver  confirms that he has been vomiting intermittently for 3 weeks and that  he makes his own decisions and get his own consent.   PAST MEDICAL HISTORY:  Significant for COPD, mitral regurgitation,  schizophrenia, history of iron deficiency anemia, and mental  retardation.  He has a history of esophageal ulcer, history of positive  PPD, hyperlipidemia, hypertension, asthma and multiple episodes of  pneumonia.  The patient reports that he has had a lung surgery; however,  I do not have any written history of this.  He also  tells me that he has  had a laparotomy.   PRIMARY CARE PHYSICIAN:  Johney Maine, MD, with Family Medicine.   GASTROENTEROLOGIST:  Everardo All. Madilyn Fireman, MD.   PSYCHIATRIST:  Dr. Brock Bad.   ALLERGIES:  PENICILLIN and TOMATOES.   CURRENT MEDICATIONS:  1. Iron.  2. Potassium.  3. Zantac.  4. Trazodone.  5. Benztropine.  6. Thioridazine.  7. Omeprazole 20 mg daily.  8. Combivent.  9. Restasis.  10.Naftin.  11.Triamterene/hydrochlorothiazide.  12.Simvastatin.   SOCIAL HISTORY:  The patient is a resident of a group home where he has  lived for the past 15 years.  He stopped smoking tobacco and he does not  drink alcohol.   REVIEW OF SYSTEMS:  Significant for recent pneumonia approximately 6  weeks ago for which he was treated with Avelox.   PHYSICAL EXAMINATION:  GENERAL:  He is awake.  He is appropriate.  VITAL SIGNS:  Temperature 97.8, pulse 76, respiration is 20,  blood  pressure is 113/86.  HEART:  Regular rate and rhythm.  LUNGS:  No wheezes or crackles.  ABDOMEN:  Nondistended, nontender, does feel firm.  He has a central  lower scar as well as laparoscopic scars in his left upper quadrant.   LABORATORY DATA:  He currently has a hemoglobin of 9.4, he has had no  transfusions.  At this point, his MCV value is 97.1.  Per his primary  care physician, his recent hemoglobin has been around 15, white count  14.1, platelets 660,000, hematocrit 28.8.  BMET significant for a  potassium that is now 4.3 that is up from 2.8 on admission, BUN 8,  creatinine 0.96.  He is now receiving 2 units of blood.   Radiological exams done in February 2008 showed a barium enema for  incomplete colonoscopy.  He had a tortuous sigmoid colon.  No polypoid  lesions or constricting lesion.   ASSESSMENT:  Dr. Molly Maduro Buccini has seen and examined the patient,  collected history and reviewed his chart.  His impression is that this  is a 76 year old male with nausea, vomiting and heme-positive  stool.  He  has relatively recent negative barium enema and colonoscopy.  We feel  upper evaluation is needed.  We will plan for upper endoscopy as soon as  possible.  Thanks very much for this consultation.      Stephani Police, PA    ______________________________  Bernette Redbird, M.D.    MLY/MEDQ  D:  08/06/2007  T:  08/07/2007  Job:  [id number]   cc:   Bernette Redbird, M.D.  John C. Madilyn Fireman, M.D.  Johney Maine, M.D.

## 2010-06-08 NOTE — Discharge Summary (Signed)
NAME:  [name] [name], [name] [name]                         ACCOUNT NO.:  [id number]   MEDICAL RECORD NO.:  [id number]                   PATIENT TYPE:  INP   LOCATION:  3027                                 FACILITY:  [facility]   PHYSICIAN:  Asencion Partridge, M.D.                  DATE OF BIRTH:  10/09/1935   DATE OF ADMISSION:  07/10/2003  DATE OF DISCHARGE:                                 DISCHARGE SUMMARY   DISCHARGE DIAGNOSES:  1. Community-acquired pneumonia.  2. Emesis of unknown etiology.  3. Moderate mental retardation.  4. Gastroesophageal reflux disease.  5. History of peptic ulcers.  6. Hiatal hernia.  7. Asthma.  8. History of iron-deficiency anemia.  9. History of esophageal ulcer.  10.      Lichen planus.  11.      Unspecified psychiatric condition for which he is followed by     mental health and on psychiatric medications.   PRINCIPAL PROCEDURES:  None.   HISTORY OF PRESENT ILLNESS:  Please chart for full details.  This is a 76-  year-old white male with moderate mental retardation who lives in a group  home and also has intermittent asthma and history of pneumonia in the past,  who was intubated in 1999 for pneumonia.  On the day prior to admission, he  did develop left-sided pleuritic chest pain which also occurred with  movement of his left upper extremity and had an occasional nonproductive  cough.  The pain was described as sharp and stabbing and did not radiate.  It was not associated with diaphoresis, nausea or vomiting.  Initially  denies shortness of breath.  He had had difficulty with ambulation described  by his caregiver as stumbling somewhat on the day of admission.  History is  mainly obtained from his caregiver, Maralyn Sago.  The patient had had  subjective fever, starting the day prior to admission.  Prior to developing  these symptoms, he was in his normal state of health.   LABORATORY DATA:  At the time of discharge, sodium was 138, potassium 3.1.  Please note  this was repleted prior to discharge.  Chloride 103, CO2 27,  glucose 102, BUN 7, creatinine 0.9.  Blood cultures no growth to date x1.  Second blood culture showed Streptococcus species, likely represents  contaminate.  Total bilirubin 1.4, alk phos 72, AST 40, ALT 28, total  protein 7.2, albumin 2.8, calcium 8.4.  WBC 18.8, hemoglobin 13.4,  hematocrit 39.3, platelets 355, total cholesterol 194, triglycerides 71, HDL  74, LDL 133, VLDL 14.   HOSPITAL COURSE:  1. Pneumonia.  Upon admission, the patient was found to have bilateral     infiltrates on x-ray and was found to have saturations in the low 80s.     He needed significant nasal cannula oxygen to keep his saturation in the     low 90s.  He was started on IV  Avelox given this was slightly broader     coverage for community-acquired pneumonia.  Given that he lived in a     group home, we thought this was appropriate.  Blood cultures were sent x2     and we were unable to acquire a sputum culture from the patient as he did     not have a productive cough.  He was provided anti-inflammatories for his     pleuritic pain.  This worked well.  The patient had no further fevers     after admission.  He did have a significantly elevated white count which     was not followed continuously after admission.  However, it was trending     downward.  The patient's lung exam improved throughout admission.  He had     no further fevers and on day #2 of admission, he was no longer requiring     nasal cannula oxygen, and by the time of discharge, he was able to     ambulate, keeping his saturations in the low 90's with ambulation and was     having no shortness of breath.  He will continue a complete 14-day course     of Avelox.  We are treating him for a longer course given the fact that     we area treating him as a community-acquired pneumonia; however, treating     this more like a hospital-acquired given that he is coming from a group      home.   1. One positive blood culture.  The patient did grow out Streptococcus     species out of one of his blood culture.  However, the patient had no     signs or symptoms of sepsis or bacteremia and this was likely the     represent a contaminate and no further work-up was performed given that     the other blood culture was negative.   1. Emesis.  Towards day #1 of admission and especially throughout day #2 of     admission, the patient developed excessive post tussive emesis which     progressed to non post tussive emesis.  We were concerned for possible     ileus and bowel obstruction.  The patient had very limited bowel sounds.     Two-view of the abdomen was performed which showed only mildly dilated     loops of bowel and ample amount of stool.  The patient was switched from     __________ Zosyn and all possible medications were switched from p.o. to     IV and all narcotics were stopped.  The patient's antitussive agent was     switched from Tussionex to Robitussin.  The patient was hydrated     aggressively with IV fluids during this time period that he was not     tolerating p.o. and was under a 24-hour time period.  His emesis     resolved.  He began stooling again. His bowel sounds returned and the     patient on the day prior to discharge and on the date of discharge was     tolerating full regular meals, was having bowel movements and had no     nausea or vomiting.  Actual source of emesis remains unknown, may have     been secondary to true post tussive emesis as well as narcotic induced as     well as possibly a very short-term ileus.  1. Intermittent asthma.  The patient was continued on his home medications     and had no problems with this throughout hospitalization.   1. Undetermined psychiatric condition.  The patient was continued on his     outpatient psychiatric medications and had no problems with this     throughout his hospitalization.  1. Peptic ulcer  disease.  The patient was continued on Protonix and he used     Zantac at home.  Had not problems with this throughout the     hospitalization.   1. Chest pain.  His admission chest pain was atypical and we thought it was     likely secondary to his pneumonia.  EKG was normal and the patient was     provided with anti-inflammatory medications which provided relief from     his chest pain.   1. Hypokalemia.  On the day of discharge, the patient was hypokalemic at     3.1.  I felt this was secondary to the excessive amount of emesis on the     two days prior to this value.  His potassium was repleted prior to     discharge.   DISCHARGE INSTRUCTIONS:  1. The patient's caregivers were instructed on changes in medications and on     length of time for Avelox and for followup appointments.  He was     instructed to follow up with Dr. Toney Rakes.  An appointment had not yet     been scheduled at the time of this dictation but will be scheduled prior     to discharge of patient.  2. DNR status is full code.   DISCHARGE MEDICATIONS:  1. Atarax 25 mg one tablet p.o. b.i.d.  2. Cogentin 1 mg daily.  3. Colace 100 mg p.o. b.i.d.  4. Combivent two puffs b.i.d.  5. Mellaril 100 mg at night.  6. Zantac 150 mg b.i.d.  7. Depakote 250 mg b.i.d.  8. Avelox 400 mg p.o. daily times nine days then stop.  9. Robitussin DM 10 mL q.4h. p.r.n. for cough.      Penni Bombard, MD                          Asencion Partridge, M.D.    SJ/MEDQ  D:  07/14/2003  T:  07/16/2003  Job:  [id number]   cc:   Maurice March, M.D.  [facility]. Med - Resident - [place], [place] [address]  Fax: [phone]

## 2010-06-08 NOTE — H&P (Signed)
NAME:  [name] [name], [name] [name] NO.:  [id number]   MEDICAL RECORD NO.:  [id number]          PATIENT TYPE:  EMS   LOCATION:  ED                           [facility]AC[facility]:  [facility]   PHYSICIAN:  Marcellus Scott, MD     DATE OF BIRTH:  10/09/1935   DATE OF ADMISSION:  04/25/2006  DATE OF DISCHARGE:                              HISTORY & PHYSICAL   PRIMARY MEDICAL DOCTOR:  [facility] at [facility],  Dr. Chuck Hint.   GASTROENTEROLOGIST:  Dr. Madilyn Fireman of [facility].   HISTORIANS AND CARE PROVIDERS:  1. Chris Foust  2. Scarlette Calico Page   CHIEF COMPLAINT:  Altered mental status, decreased appetite, dizziness,  fever, dyspnea.   HISTORY OF PRESENT ILLNESS:  Mr. Chianese is a pleasant 76 year old  Caucasian male patient with past medical history as indicated below.  He  is a resident of a group home.  He was in his usual state of health  until early this morning.  This morning when Mr. Foust went to see Mr.  Rosato he was found at about 10 a.m. lying in his bed.  When he woke him  up, the patient was noticed to have altered mental status.  He was out  of character, confused.  He took a while to recognize the historian  which is very unusual for him.  The patient also was noted to have a  decreased appetite.  On asking the patient to get out of bed, the  patient was noted to be unsteady of gait and nearly fell and was held by  the historian.  Subsequently, they called the primary medical doctor and  the patient was brought to the emergency room at [facility]  where he was noted to have low-grade pyrexia.  The patient is also said  to have wet cough, chills, dyspnea.  Since being in the emergency room,  his mental status has improved by 50% according to the historians.  The  patient also was complaining of dizziness at the group home.   PAST MEDICAL HISTORY:  1. Pneumonia.  The patient is said to have similar kind of change of      mental status  when he has had pneumonia.  2. Moderate mental retardation.  3. Gastroesophageal reflux disease.  4. Peptic ulcer disease.  5. Hiatal hernia.  6. Asthma with history of intubation in 1999.  7. Iron-deficiency anemia with workup by gastroenterology with      negative colonoscopy in February  2007, also barium enema negative.  8. Esophageal ulcer.  9. Psychiatric condition, unspecified.   PAST SURGICAL HISTORY:  Laparotomy for bleed peptic ulcer.   ALLERGIES:  PENICILLIN, UNKNOWN ALLERGY.   MEDICATIONS:  1. Benztropine 1 mg at bedtime.  2. Combivent inhaler 2 puffs twice daily.  3. Phenergan 12.5 mg p.o. p.r.n.  4. Ranitidine 150 mg p.o. twice daily.  5. Mellaril 150 mg in the morning and 100 mg at bedtime.  6. Trazodone 50 mg p.o. at bedtime.  7. Triamterene/hydrochlorothiazide 37.5/25 mg 1 p.o. daily.  8. Omeprazole 20 mg p.o. twice daily.  9. Lipitor 10 mg p.o. once a day.  10.__________  DM suspension 1 teaspoon by mouth every 4 hourly as      needed for cough.   FAMILY HISTORY:  Noncontributory.   SOCIAL HISTORY:  The patient is a resident of a group home for the last  12 years.  There is no history of smoking, alcohol or drug abuse.  The  patient is independent of his activities of daily living, but needs 24-  hour supervision.   REVIEW OF SYSTEMS:  HEAD, EYE, ENT:  Some sore throat.  GENERAL:  Dizziness, fever, chills, rigors.  RESPIRATORY:  Wet cough, dyspnea, but no chest pain or wheezing.  CARDIOVASCULAR:  No angina type of chest pain, no palpitations.  ABDOMEN/GI:  Poor appetite.  No nausea, vomiting, constipation,  abdominal pain or diarrhea.  GENITOURINARY:  No urinary frequency or  urgency.  CENTRAL NERVOUS SYSTEM:  No asymmetric limb weakness or slurred speech  or twisting of the mouth.   PHYSICAL EXAMINATION:  GENERAL:  Mr. Rhett is a moderately built and  nourished male patient in no obvious cardiopulmonary or painful  distress.  VITAL SIGNS:   Temperature 99.6, blood pressure 103/56, pulse 93 per  minute, respirations 10 per minute, saturating at 94% on room air.  HEAD, EYE, ENT:  Nontraumatic, normocephalic.  Pupils equally reacting  to light and accommodation.  No oropharyngeal erythema.  Multiple  missing teeth.  Some caries to teeth as well.  NECK:  Without JVD, carotid bruit, lymphadenopathy or goiter.  Supple.  RESPIRATORY:  Good breath sounds bilaterally.  Crackles in the left base  anteriorly and posteriorly.  There is no wheezing or rhonchi.  CARDIOVASCULAR:  First and second heart sounds heard.  No third or  fourth heart sounds.  No murmurs, rubs, gallops or clicks.  ABDOMEN:  Extensive midline laparotomy scar.  It is nondistended,  nontender.  No organomegaly or mass.  Bowel sounds are preserved.  CENTRAL NERVOUS SYSTEM:  The patient is awake, alert, oriented x3.  No  focal neurological deficits. Inappropriate laugh at times.  EXTREMITIES:  No clubbing, cyanosis or edema.  Peripheral pulses are  symmetrically felt.  SKIN:  Without any rashes.   LABORATORY DATA:  CBC: Hemoglobin 7.5, hematocrit 24, white blood cell  20, platelet count of 556.  Basic metabolic panel remarkable for  potassium of 3.1.   Chest x-ray which has be viewed by me and is being reported as (1) left  mid lung pneumonia, (2) bibasilar linear opacities may reflect  atelectasis or pneumonia.   ASSESSMENT AND PLAN:  1. Community-acquired left-side pneumonia.  Will admit the patient.      Will obtain blood cultures and sputum cultures.  The patient has      already received IV ceftriaxone and p.o. Zithromax which he has      tolerated in the emergency room.  Will continue with IV ceftriaxone      and Zithromax.  Will also place the patient on Mucinex and      Robitussin DM and oxygen.  2. Altered mental status is probably due to pneumonia complicating his      moderate mental retardation.  However, will obtain a CT head to     rule out acute  strokes.  3. Severe symptomatic iron-deficiency anemia.  Will transfuse 2 units      of packed red blood cells after iron studies and will place the      patient on iron sulfate.  4. Leucocytosis is probably secondary to his pneumonia.  Will monitor      CBCs.  We will check urinalysis.  5. Asthma is stable at this time.  Will continue his Combivent      inhalers of home.  6. Will also continue the patient's home medications of benztropine,      thioridazine, trazodone, triamterene/hydrochloride and Lipitor.  7. Hypokalemia.  Will replete potassium p.o. and IV will check his      basic metabolic panel and magnesium.      Marcellus Scott, MD  Electronically Signed     AH/MEDQ  D:  04/25/2006  T:  04/25/2006  Job:  [id number]   cc:   Sibyl Parr. Darrick Penna, M.D.   [facility] Madilyn Fireman, M.D.  Fax: [phone]

## 2010-06-08 NOTE — Discharge Summary (Signed)
[name], [name] [name] NO.:  [id number]   MEDICAL RECORD NO.:  [id number]          PATIENT TYPE:  INP   LOCATION:  1608                         FACILITY:  [facility]   PHYSICIAN:  Marcellus Scott, MD     DATE OF BIRTH:  10/09/1935   DATE OF ADMISSION:  04/25/2006  DATE OF DISCHARGE:  04/28/2006                         DISCHARGE SUMMARY - REFERRING   PRIMARY MEDICAL DOCTOR:  Dr. Corliss Marcus of the [facility] of [facility].   GASTROENTEROLOGIST:  Everardo All. Madilyn Fireman, M.D., of [facility].   DISCHARGE DIAGNOSES:  1. Community acquired pneumonia.  2. Altered mental status.  3. Symptomatic iron deficiency anemia.  4. Hypokalemia.  5. Leukocytosis.  6. Reactive thrombocytosis.  7. Moderate mental retardation.   DISCHARGE MEDICATIONS:  1. Avelox 400 mg p.o. daily for a week.  2. Mucinex 600 mg p.o. b.i.d. for a week.  3. Cogentin 1 mg p.o. q.h.s.  4. Trazodone 50 mg p.o. q.h.s.  5. Mellaril 50 mg p.o. daily and 100 mg p.o. q.h.s.  6. Triamterine/HCTZ 37.5/25 mg tablet, 1 p.o. daily.  7. Lipitor 10 mg p.o. q.h.s.  8. Potassium chloride 20 mEq p.o. daily.  9. Combivent inhaler 2 puffs inhaled b.i.d.  10.Ferrous sulfate 325 mg p.o. b.i.d.  11.Phenergan 12.5 mg p.o. p.r.n.  12.Ranitidine 150 mg p.o. twice daily.  13.Omeprazole 20 mg p.o. twice daily.   PROCEDURES:  1. CT of the head without contrast on the 04/25/2006.  Impression      findings, most compatible with extensive chronic microvascular      ischemic disease.  Chronic hypertensive encephalopathy would be a      leading consideration.  No acute intracranial abnormalities.  2. On the 04/25/2006 is chest x-ray.  Impression 1.  Left mid lung      pneumonia.  2.  Bibasilar linear opacities may reflect atelectasis      or  pneumonia.   PERTINENT LAB DATA:  CBC on the 04/06 hemoglobin 9.5, hematocrit 29,  white blood cell 11.9, platelet 503, MCV of 72.3.  Basic metabolic panel  sodium 133,  potassium 3.5, chloride 102, bicarb 25, glucose of 84, BUN  6, creatinine 0.94.  Lipid panel with total cholesterol of 165,  triglyceride 69, HDL 43, LDL 108, VLDL 14 on admission 4.3, TSH of  1.656.   CONSULTATIONS:  None.   HOSPITAL COURSE AND PATIENT DISPOSITION:  For details of initial part of  admission please refer to the history and physical note that was done by  Dr. Waymon Amato on 04/25/2006.  In summary the patient is 76 year old  pleasant Caucasian male with history of moderate mental retardation,  Iron Deficiency anemia who lives in group home. He presented with  history of altered mental status, decreased appetite, dizziness, fever  and dyspnea.  According to his caregiver's whenever he has a pneumonia  the patient has similar kind of altered mental status.  He was evaluated  and found to have pneumonia and significant anemia for which he was  admitted for further evaluation and management.   1. Community acquired pneumonia.  Patient was  admitted to the      hospital.  Blood cultures were obtained which are so far negative.      Final cultures to be followed up as an outpatient.  Patient was      placed on IV ceftriaxone and Zithromax and IV hydration.  With      these measures patient progressively did well with no further      dyspnea or fever.  The patient continues to have minimal cough.      His lung findings are still with a few crackles in the bases.  The      patient has been switched to oral Avelox yesterday.  Patient is to      complete an additional weeks worth of Avelox as an out patient.      Immediate medical attention has to be sought for the patient if      there is any further deterioration in his status. Otherwise he is      to followup with his primary medical doctor.  2. Altered mental status: This is secondary to community acquired      pneumonia complicating his moderate mental retardation.  Patient is      probably at his baseline at this time.  A CT  of the head was done      and negative for acute findings.  3. Symptomatic severe iron deficiency anemia.  Patient apparently had      GI workup as outpatient which is said to be negative. His admission      H and H were 7.5 and 24.  He was transfused 2 units of packed red      blood cells and his most current CBC are as above.  He is      asymptomatic with no weakness or dyspnea at this time.  His CBCs      will have to be followed up periodically as an outpatient.  Patient      has also been started on iron sulfate supplements. Further workup      as deemed necessary as an outpatient.  4. Hypokalemia which was repleted and corrected.  5. Leukocytosis which is probably secondary to his pneumonia and has      improved.  6. Thrombocytosis which is probably reactive.  He had a platelet count      of 556 on admission and this is probably secondary to his pneumonia      and is decreasing. Followup CBC's.  7. Moderate mental retardation.  Patient is to return to the group      home with home health.      Marcellus Scott, MD  Electronically Signed     AH/MEDQ  D:  04/28/2006  T:  04/28/2006  Job:  [id number]   cc:   Everardo All. Madilyn Fireman, M.D.  Fax: [phone]   Sibyl Parr. Darrick Penna, M.D.

## 2010-06-08 NOTE — H&P (Signed)
NAME:  [name] [name], [name] [name] NO.:  [id number]   MEDICAL RECORD NO.:  [id number]                   PATIENT TYPE:  INP   LOCATION:  3027                                 FACILITY:  [facility]   PHYSICIAN:  Asencion Partridge, M.D.                  DATE OF BIRTH:  10/09/1935   DATE OF ADMISSION:  07/10/2003  DATE OF DISCHARGE:                                HISTORY & PHYSICAL   PRIMARY CARE Aleister Lady:  Maurice March, M.D. at [facility].   CHIEF COMPLAINT:  Pleuritic chest pain.   HISTORY OF PRESENT ILLNESS:  The patient is a 76 year old white male with  mental retardation, who lives in a group home, and also has intermittent  asthma, and a history of pneumonia with intubation in 1999.  Yesterday, he  developed left sided pleuritic chest pain which also occurred with movement  of his left upper extremity.  He does have occasional nonproductive cough.  The pain is described as sharp, stabbing, and does not radiate.  It is not  associated with diaphoresis, nausea or vomiting.  The patient denies  shortness of breath.  He has had difficulty with ambulation, described by  his caregiver as stumbling somewhat today.  The history is mainly obtained  from his caregiver, Maralyn Sago.  The patient had subjective fever  starting the day prior to admission.  Prior to development of these  symptoms, he was in his normal state of health.   PAST MEDICAL HISTORY:  1. Moderate mental retardation.  2. GERD.  3. History of peptic ulcer disease.  4. Hiatal hernia.  5. Intermittent asthma.  6. History of iron deficiency anemia.  7. History of esophageal ulcer.  8. Lichen planus.  9. Community-acquired pneumonia in 1998.  10.      Aspiration pneumonia, in 1999, for which he was hospitalized and     intubated.  11.      The patient also has a history of a positive PPD with a negative     chest x-ray.  12.      Unspecified psychiatric condition for which he is followed  by     mental health.   PAST SURGICAL HISTORY:  History of surgery for ulcers many years ago.   MEDICATIONS:  1. Atarax 25 mg b.i.d.  2. Cogentin 1 mg daily.  3. Colace 100 mg b.i.d.  4. Combivent two puffs b.i.d.  5. Mellaril 100 mg q.h.s.  6. Phenergan 12.5 mg p.o. q.6h. p.r.n. (patient has not needed this     recently).  7. Zantac 150 mg b.i.d.  8. Depakote 250 mg b.i.d.   ALLERGIES:  PENICILLIN causes a reaction that is unknown to his caregiver.   SOCIAL HISTORY:  The patient lives in a group home with two other residents  and his caregiver Maralyn Sago.  Contact numbers [id number], [id number], and  [phone].  Per Ms.  Etheleen Mayhew, the patient is his primary decision maker  followed by herself.  He does have a brother who is not very involved.  The  patient does not smoke or drink.   FAMILY HISTORY:  There is no family history of CAD, otherwise unobtainable.   REVIEW OF SYSTEMS:  The patient had a somewhat decreased appetite on the day  of admission but reports hunger currently.  The pain initially started in  his left anterior chest but now is more located on the left lateral chest.  No abdominal pain, diarrhea, URI symptoms.  The patient is voiding normally  with maybe somewhat increased frequency and is also having normal bowel  movements.  No increased work of breathing, and no rash.   PHYSICAL EXAMINATION:  VITAL SIGNS:  Temperature 102.3, decreasing to 97.6  after Tylenol, blood pressure 114/72, heart rate 100, respiratory rate 24-  28, O2 sat 88% on room air increasing to 96% on 2 liters.  GENERAL:  He is an overweight white male, no acute distress, breathing  comfortably and complaining of mild discomfort when he presses on his left  anterior and lateral chest.  His insight and judgment are poor secondary to  his MR.  He was alert but unable to give details and his caregiver was at  the bedside.  HEENT:  PERRL.  EOMI.  Sclerae white, not injected.  Moist mucous  membranes.  Poor dentition.  OP clear without erythema or exudate.  Bilateral tympanic  membranes blocked by impacted cerumen.  NECK:  Supple with no lymphadenopathy or thyromegaly.  CHEST:  Mild tenderness to palpation the left lateral chest and the left  lower chest around the costal margin.  LUNGS:  Bibasilar rales.  No wheezes or rhonchi and no increased work of  breathing accessory muscle use.  CARDIOVASCULAR:  Regular rate and rhythm.  No murmurs, rubs or gallops.  DP  pulses 2+ bilaterally.  EXTREMITIES:  No clubbing, cyanosis, or edema.  ABDOMEN:  Soft.  Normal bowel sounds.  Nontender, nondistended.  Obese with  a well healed midline surgical scar on the upper abdomen.  No appreciable  HSM.   LABS:  White count elevated at 19.2 with 77% neutrophils, 7% lymphs, 16%  monocytes, hemoglobin 12.8, hematocrit of 38.1, platelets 395.  I-STAT is  pending.  Blood cultures x 2 are pending.  Fasting lipid panel is pending.   IMAGING:  PA and lateral chest x-ray showed bibasilar infiltrates, fluid in  the right fissure, and blunted angles bilaterally.   ASSESSMENT:  A 76 year old white male with fever, leukocytosis, bibasilar  infiltrates, and hypoxia, all consistent with pneumonia.   PLAN:  1. Pneumonia.  We will treat as a nosocomial infection secondary to his     living in a group home.  Currently receiving Avelox in the emergency     room, so we will continue on this for now.  Use oxygen as needed to keep     his sats greater than 92% and Tylenol as needed for fever.  Blood     cultures pending.  Probably will not be able to get sputum for culture as     a nonproductive cough but we will go ahead and order just in case.  We     will encourage him to cough.  Antiinflammatories for pleuritic pain.     Follow the fever curve and symptoms.  2. Intermittent asthma.  We will continue the patient on Combivent b.i.d.    for now  as this is what he uses as an outpatient.  This does not seem  to     be an asthma exacerbation.  3. Mental retardation/psychiatric disorder.  We will continue outpatient     psych meds.  4. Peptic ulcer disease.  We will continue Zantac.  5. Chest pain.  Atypical and seems musculoskeletal likely secondary to     problem number one, but we will check an EKG.  Antiinflammatories as     above.  6. Fluid, electrolytes, nutrition.  Regular diet, normal saline __________     IV.  Check I-STAT.   CODE STATUS:  Full code.   DISPOSITION:  Awaits the patient to be afebrile greater than 24 hours with  resolution of hypoxia and tolerance of p.o. medicines and food.  Anticipate  return to group home shortly.      Georgina Peer, M.D.                 Asencion Partridge, M.D.    JM/MEDQ  D:  07/11/2003  T:  07/12/2003  Job:  [id number]   cc:   Maurice March, M.D.  [facility]. Med - Resident - [place], [place] [address]  Fax: [phone]

## 2010-06-22 ENCOUNTER — Inpatient Hospital Stay (HOSPITAL_COMMUNITY)
Admission: RE | Admit: 2010-06-22 | Discharge: 2010-06-22 | Disposition: A | Discharge status: Home / Self Care | Payer: Medicare Other | Source: Ambulatory Visit | Attending: Family Medicine | Admitting: Family Medicine

## 2010-06-25 ENCOUNTER — Ambulatory Visit: Payer: Medicare Other | Admitting: Family Medicine

## 2010-08-09 ENCOUNTER — Telehealth: Payer: Self-pay | Admitting: Family Medicine

## 2010-08-09 NOTE — Telephone Encounter (Signed)
Has orders to take patients BP every week and wants to know what the parameters are.  What is too high and what is too low.

## 2010-08-10 NOTE — Telephone Encounter (Signed)
I do not see where anyone told him to take his BP and he was normotensive at last visit--si=o I woiuld tell him not to worry about it unless he is having problems.

## 2010-08-13 NOTE — Telephone Encounter (Signed)
Spoke with [name] [name] and he stated that the reason why they were taking his BP weekly is because it is written on the FL2 form. I explained to him that there was no orders seen. Being that these orders we on the [facility] form they are needing a written order to stop doing this if not necessary

## 2010-08-15 NOTE — Telephone Encounter (Signed)
Dear [place] [facility] [employer] [place] I have no idea---havehim come for an appointment [facility]! Denny Levy

## 2010-08-16 NOTE — Telephone Encounter (Signed)
Called did not leave a vm because [name] [name]'s name was not listed on the directory. I will try again later.[name], [name]

## 2010-08-16 NOTE — Telephone Encounter (Signed)
Dr. Jennette Kettle,  Could you please write an order stating that monitoring of his bp can be d/c'ed please.  Thanks  ~T

## 2010-08-20 NOTE — Telephone Encounter (Signed)
Spoke w/ Gerlean Ren at facility, he will fax form to d/c BP checks.

## 2010-08-20 NOTE — Telephone Encounter (Signed)
Dear [place] [employer] Please give them a verbal order and they can send me the paperwork to sign--d/c blood pressure checks. I reviewed his chart and he seems to have control of BP  THANKS! Denny Levy

## 2010-10-11 ENCOUNTER — Emergency Department (HOSPITAL_COMMUNITY)
Admission: EM | Admit: 2010-10-11 | Discharge: 2010-10-12 | Disposition: A | Discharge status: Home / Self Care | Payer: Medicare Other | Source: Home / Self Care | Attending: Emergency Medicine | Admitting: Emergency Medicine

## 2010-10-11 ENCOUNTER — Emergency Department (HOSPITAL_COMMUNITY): Payer: Medicare Other

## 2010-10-11 DIAGNOSIS — J449 Chronic obstructive pulmonary disease, unspecified: Secondary | ICD-10-CM | POA: Diagnosis present

## 2010-10-11 DIAGNOSIS — J189 Pneumonia, unspecified organism: Principal | ICD-10-CM | POA: Diagnosis present

## 2010-10-11 DIAGNOSIS — G510 Bell's palsy: Secondary | ICD-10-CM | POA: Diagnosis present

## 2010-10-11 DIAGNOSIS — E876 Hypokalemia: Secondary | ICD-10-CM | POA: Diagnosis present

## 2010-10-11 DIAGNOSIS — F79 Unspecified intellectual disabilities: Secondary | ICD-10-CM | POA: Diagnosis present

## 2010-10-11 DIAGNOSIS — K449 Diaphragmatic hernia without obstruction or gangrene: Secondary | ICD-10-CM | POA: Diagnosis present

## 2010-10-11 DIAGNOSIS — K219 Gastro-esophageal reflux disease without esophagitis: Secondary | ICD-10-CM | POA: Diagnosis present

## 2010-10-11 DIAGNOSIS — D509 Iron deficiency anemia, unspecified: Secondary | ICD-10-CM | POA: Diagnosis present

## 2010-10-11 DIAGNOSIS — E785 Hyperlipidemia, unspecified: Secondary | ICD-10-CM | POA: Diagnosis present

## 2010-10-11 DIAGNOSIS — J4489 Other specified chronic obstructive pulmonary disease: Secondary | ICD-10-CM | POA: Diagnosis present

## 2010-10-11 DIAGNOSIS — I10 Essential (primary) hypertension: Secondary | ICD-10-CM | POA: Diagnosis present

## 2010-10-11 LAB — URINE MICROSCOPIC-ADD ON

## 2010-10-11 LAB — URINALYSIS, ROUTINE W REFLEX MICROSCOPIC
Glucose, UA: NEGATIVE mg/dL
Leukocytes, UA: NEGATIVE
Protein, ur: 30 mg/dL — AB
Specific Gravity, Urine: 1.016 (ref 1.005–1.030)

## 2010-10-11 LAB — CBC
HCT: 30 % — ABNORMAL LOW (ref 39.0–52.0)
Hemoglobin: 10.2 g/dL — ABNORMAL LOW (ref 13.0–17.0)
MCV: 85.5 fL (ref 78.0–100.0)
RBC: 3.51 MIL/uL — ABNORMAL LOW (ref 4.22–5.81)
WBC: 28.3 10*3/uL — ABNORMAL HIGH (ref 4.0–10.5)

## 2010-10-11 LAB — COMPREHENSIVE METABOLIC PANEL
Albumin: 2.8 g/dL — ABNORMAL LOW (ref 3.5–5.2)
BUN: 17 mg/dL (ref 6–23)
Calcium: 9 mg/dL (ref 8.4–10.5)
GFR calc Af Amer: >60 mL/min (ref >60)
Glucose, Bld: 185 mg/dL — ABNORMAL HIGH (ref 70–99)
Potassium: 2.9 mEq/L — ABNORMAL LOW (ref 3.5–5.1)
Total Protein: 8 g/dL (ref 6.0–8.3)

## 2010-10-12 ENCOUNTER — Inpatient Hospital Stay (HOSPITAL_COMMUNITY)
Admission: AD | Admit: 2010-10-12 | Discharge: 2010-10-15 | DRG: 194 | Disposition: A | Discharge status: Home / Self Care | Payer: Medicare Other | Source: Other Acute Inpatient Hospital | Attending: Family Medicine | Admitting: Family Medicine

## 2010-10-12 ENCOUNTER — Encounter: Payer: Self-pay | Admitting: Family Medicine

## 2010-10-12 DIAGNOSIS — F79 Unspecified intellectual disabilities: Secondary | ICD-10-CM

## 2010-10-12 DIAGNOSIS — J441 Chronic obstructive pulmonary disease with (acute) exacerbation: Secondary | ICD-10-CM

## 2010-10-12 DIAGNOSIS — J159 Unspecified bacterial pneumonia: Secondary | ICD-10-CM

## 2010-10-12 LAB — PRO B NATRIURETIC PEPTIDE: Pro B Natriuretic peptide (BNP): 569.4 pg/mL — ABNORMAL HIGH (ref 0–125)

## 2010-10-12 LAB — CBC
MCH: 29.3 pg (ref 26.0–34.0)
MCV: 84.6 fL (ref 78.0–100.0)
Platelets: 613 10*3/uL — ABNORMAL HIGH (ref 150–400)
RDW: 14.2 % (ref 11.5–15.5)
WBC: 34.4 10*3/uL — ABNORMAL HIGH (ref 4.0–10.5)

## 2010-10-12 LAB — COMPREHENSIVE METABOLIC PANEL
Albumin: 2.7 g/dL — ABNORMAL LOW (ref 3.5–5.2)
BUN: 14 mg/dL (ref 6–23)
Calcium: 9.1 mg/dL (ref 8.4–10.5)
GFR calc Af Amer: >60 mL/min (ref >60)
Glucose, Bld: 139 mg/dL — ABNORMAL HIGH (ref 70–99)
Total Protein: 7.9 g/dL (ref 6.0–8.3)

## 2010-10-12 LAB — MRSA PCR SCREENING: MRSA by PCR: NEGATIVE

## 2010-10-12 LAB — DIFFERENTIAL
Basophils Relative: 0 % (ref 0–1)
Eosinophils Absolute: 0 10*3/uL (ref 0.0–0.7)
Eosinophils Relative: 0 % (ref 0–5)
Lymphs Abs: 1.7 10*3/uL (ref 0.7–4.0)
Monocytes Relative: 11 % (ref 3–12)
Neutrophils Relative %: 84 % — ABNORMAL HIGH (ref 43–77)

## 2010-10-12 LAB — URINE CULTURE
Colony Count: NO GROWTH
Culture  Setup Time: 201209210105
Culture: NO GROWTH

## 2010-10-12 LAB — PROTIME-INR: Prothrombin Time: 15.5 seconds — ABNORMAL HIGH (ref 11.6–15.2)

## 2010-10-12 LAB — GLUCOSE, CAPILLARY: Glucose-Capillary: 169 mg/dL — ABNORMAL HIGH (ref 70–99)

## 2010-10-12 LAB — TSH: TSH: 2.033 u[IU]/mL (ref 0.350–4.500)

## 2010-10-12 NOTE — H&P (Signed)
[name] [name] is an 76 y.o. male.   Chief Complaint: Pneumonia  HPI: Mr. [name] [name] is 76 year old gentleman with mental retardation who lives in a group home that presented with a one-day history of fever and urinary incontinence to the ED at [facility]. At the time of this H&P, the home's manager was not present and the patient was incapable of providing details of his history. In the ED, the patient demonstrated hyponatremia and hypokalemia on CMET for which the patient was given 40 mEq of KCl and NS IVF respectively. Also the patient had a WBC count of 28.3 and a chest X-ray revealed a LUL and LUL opacities. Therefore, Mr. [name] [name] was given Rocephin 1 gm IV and Azithromycin 500 mg PO. He was also placed on oxygen 2L via [place] for an oxygen saturation of 90% and acetaminophen for a fever. He was then transferred to [facility] for treatment of presumed community-acquired pneumonia.   PMH:  MENTAL RETARDATION HYPERLIPIDEMIA  HYPOKALEMIA  ANEMIA, IRON DEFICIENCY NOS  PSYCHOSIS, UNSPECIFIED  BELLS PALSY  HYPERTENSION, BENIGN SYSTEMIC  ASTHMA, INTERMITTENT  COPD  ESOPHAGEAL ULCER, WITH BLEEDING  GASTROESOPHAGEAL REFLUX, NO ESOPHAGITIS  BARRETTS ESOPHAGUS  HERNIA, HIATAL, NONCONGENITAL  CHOLELITHIASIS  GI BLEEDING  ECZEMA  TOBACCO USE, QUIT  Allergic rhinitis   PSH: Unknown  FH: Unknown  Social History: Does not currently smoke, drink alcohol or use recreational drugs; lives at a group home   Allergies:  Allergies  Allergen Reactions  . Penicillins     No current facility-administered medications on file as of 10/12/2010.   Medications Prior to Admission  Medication Sig Dispense Refill  . albuterol (PROAIR HFA) 108 (90 BASE) MCG/ACT inhaler Inhale 2 puffs into the lungs every 4 (four) hours as needed. For  SOB/wheezing. Use with spacer       . albuterol-ipratropium (COMBIVENT) 18-103 MCG/ACT inhaler Inhale 2 puffs into the lungs 4 (four) times daily. Do not use more than 4x per day       .  ARTIFICIAL TEARS ophthalmic solution Place 2 drops into the left eye 3 (three) times daily. 2 drops in left eye 3 times a day until eye condition resolves. Dips QS 1 month       . benztropine (COGENTIN) 1 MG tablet Take 1 mg by mouth at bedtime. Prescriber: Hazle Coca      . clotrimazole (LOTRIMIN) 1 % cream Apply topically 2 (two) times daily.  30 g  3  . docusate sodium (COLACE) 100 MG capsule Take 100 mg by mouth 2 (two) times daily. Scheduled        . ENSURE (ENSURE) Take 237 mLs by mouth. Drink 1 every other day       . esomeprazole (NEXIUM) 20 MG capsule Take 40 mg by mouth 2 (two) times daily. for esophageal ulcers/barretts esophagus      . ferrous sulfate 325 (65 FE) MG tablet Take 325 mg by mouth daily. Take 1 tab daily       . fluticasone (FLOVENT HFA) 220 MCG/ACT inhaler Inhale 1 puff into the lungs 2 (two) times daily.        . memantine (NAMENDA) 10 MG tablet Take 10 mg by mouth 2 (two) times daily. Prescriber: Hazle Coca       . Olopatadine HCl 0.2 % SOLN Apply 1 drop to eye 2 (two) times daily.        . polyethylene glycol (MIRALAX) powder Take 17 g by mouth daily. 17gm in 4-8 ox  of juice or water by mouth two times a day only as needed for constipation. May hold for loose stools. Disp 527 grams.       . potassium chloride (KLOR-CON) 20 MEQ packet Take 20 mEq by mouth daily. 1 tab by mouth daily       . simvastatin (ZOCOR) 20 MG tablet Take 20 mg by mouth at bedtime.        . Skin Protectants, Misc. (EUCERIN) cream Apply topically 2 (two) times daily. Apply liberally to skin twice a day. Disp 1 large jar       . sucralfate (CARAFATE) 1 GM/10ML suspension Take 1 g by mouth 4 (four) times daily. 2 teaspoons (1 gm)Disp QS 1 month       . thioridazine (MELLARIL) 50 MG tablet Take 50 mg by mouth every morning. One tablet in AM, 2 at bedtime.  Prescriber: Hazle Coca       . triamterene-hydrochlorothiazide (DYAZIDE) 37.5-25 MG per capsule Take 1 capsule by mouth daily.           Results for orders placed during the hospital encounter of 10/11/10 (from the past 48 hour(s))  COMPREHENSIVE METABOLIC PANEL     Status: Abnormal   Collection Time   10/11/10  6:38 PM      Component Value Range Comment   Sodium 126 (*) 135 - 145 (mEq/L)    Potassium 2.9 (*) 3.5 - 5.1 (mEq/L)    Chloride 87 (*) 96 - 112 (mEq/L)    CO2 28  19 - 32 (mEq/L)    Glucose, Bld 185 (*) 70 - 99 (mg/dL)    BUN 17  6 - 23 (mg/dL)    Creatinine, Ser 9.14  0.50 - 1.35 (mg/dL)    Calcium 9.0  8.4 - 10.5 (mg/dL)    Total Protein 8.0  6.0 - 8.3 (g/dL)    Albumin 2.8 (*) 3.5 - 5.2 (g/dL)    AST 782 (*) 0 - 37 (U/L)    ALT 96 (*) 0 - 53 (U/L)    Alkaline Phosphatase 105  39 - 117 (U/L)    Total Bilirubin 0.5  0.3 - 1.2 (mg/dL)    GFR calc non Af Amer >60  >60 (mL/min)    GFR calc Af Amer >60  >60 (mL/min)   CBC     Status: Abnormal   Collection Time   10/11/10  6:38 PM      Component Value Range Comment   WBC 28.3 (*) 4.0 - 10.5 (K/uL)    RBC 3.51 (*) 4.22 - 5.81 (MIL/uL)    Hemoglobin 10.2 (*) 13.0 - 17.0 (g/dL)    HCT 95.6 (*) 21.3 - 52.0 (%)    MCV 85.5  78.0 - 100.0 (fL)    MCH 29.1  26.0 - 34.0 (pg)    MCHC 34.0  30.0 - 36.0 (g/dL)    RDW 08.6  57.8 - 46.9 (%)    Platelets COUNT MAY BE INACCURATE DUE TO FIBRIN CLUMPS.  150 - 400 (K/uL) PLATELETS APPEAR INCREASED  PROCALCITONIN     Status: Normal   Collection Time   10/11/10  6:38 PM      Component Value Range Comment   Procalcitonin 0.57     LACTIC ACID, PLASMA     Status: Normal   Collection Time   10/11/10  6:39 PM      Component Value Range Comment   Lactic Acid, Venous 1.3  0.5 - 2.2 (mmol/L)   URINALYSIS, ROUTINE W  REFLEX MICROSCOPIC     Status: Abnormal   Collection Time   10/11/10  7:18 PM      Component Value Range Comment   Color, Urine YELLOW  YELLOW     Appearance CLEAR  CLEAR     Specific Gravity, Urine 1.016  1.005 - 1.030     pH 6.5  5.0 - 8.0     Glucose, UA NEGATIVE  NEGATIVE (mg/dL)    Hgb urine dipstick  LARGE (*) NEGATIVE     Bilirubin Urine NEGATIVE  NEGATIVE     Ketones, ur NEGATIVE  NEGATIVE (mg/dL)    Protein, ur 30 (*) NEGATIVE (mg/dL)    Urobilinogen, UA 1.0  0.0 - 1.0 (mg/dL)    Nitrite NEGATIVE  NEGATIVE     Leukocytes, UA NEGATIVE  NEGATIVE    URINE MICROSCOPIC-ADD ON     Status: Abnormal   Collection Time   10/11/10  7:18 PM      Component Value Range Comment   WBC, UA 3-6  <3 (WBC/hpf)    RBC / HPF 7-10  <3 (RBC/hpf)    Casts HYALINE CASTS (*) NEGATIVE     Dg Pneumonia Chest 2v  10/11/2010  *RADIOLOGY REPORT*  Clinical Data: Fever.  Weakness.  CHEST - 2 VIEW  Comparison: 04/03/2010  Findings: Consolidation in the left lung apex noted with patchy airspace opacities in the left upper lobe and likely portions of the left lower lobe.  There is scarring or subsegmental atelectasis at the right lung base.  Density along the right mediastinal margin may be from ectatic vasculature or less likely adenopathy.  IMPRESSION:  1.  Consolidation at the left lung apex with patchy opacities in the rest of the left upper lobe and left lower lobe, suspicious for left upper lobe and left lower lobe pneumonia. 2.  Subsegmental atelectasis or scarring at the right lung base. 3.  Adenopathy or ectatic vasculature along the right mediastinal margin.  Original Report Authenticated By: Dellia Cloud, M.D.    Review of Systems  Unable to perform ROS  T: 101.9 BP: 120/80 RR: 31 P: 107 O2: 92% on RA  Physical Exam  Constitutional: He appears well-developed and well-nourished.       Patient pleasant and interactive, but not coherent with questioning secondary to retardation  HENT:  Head: Normocephalic and atraumatic.  Mouth/Throat: Uvula is midline. Mucous membranes are dry. No oropharyngeal exudate, posterior oropharyngeal edema, posterior oropharyngeal erythema or tonsillar abscesses.       Poor dentition; dry, cracked lips  Eyes: Conjunctivae and EOM are normal. Pupils are equal, round, and  reactive to light.  Neck: Trachea normal. Neck supple. No mass and no thyromegaly present.  Cardiovascular: Normal rate and regular rhythm.   No murmur heard. Respiratory: No respiratory distress. He has decreased breath sounds in the left upper field and the left lower field. He has rales in the right lower field.  GI: Normal appearance and bowel sounds are normal. There is no tenderness.  Genitourinary:       Condom catheter in place 2/2 urinary incontinence  Musculoskeletal:       4/5 strength of upper extremities bilaterally; full drip strength; Patient needed assistance to remain in a seated position, but the origin of his weakness was difficult to discern; No tenderness or swelling of lower extermities  Lymphadenopathy:       Head (right side): No submental and no submandibular adenopathy present.       Head (left side):  No submental and no submandibular adenopathy present.  Neurological: He is alert.       The patient did not appear to have any focal deficits but claims that he cannot walk or stand on his own because he will fall; normal muscle tone in all extremities   Psychiatric:       Patient pleasant but poor with conversation; no evidence of response to internal stimuli;          Assessment/Plan Mr. Totherow is a very kind 76 year old man with mental retardation who presents with community acquired pneumonia of the left lung causing mild respiratory distress.   1. Admit the patient to the [facility] Medicine Teaching Service, a regular floor bed, under Dr. Pearlean Brownie 2. Infectious Disease:  Continue with Rocephin 1 gm IV daily and Azithromycin 500 mg BID for community acquired pneumonia; For fever - acetaminophen 650 mg PRN 3. Hematologic:  A.Anemia - Check anemia panel, fecal occult blood test for sources of anemia - CBC w/ diff in AM;   B. Leukocytosis - check CBC in AM 4. Psych - continue home doses of Latuda, Namenda, Thioridazine 5. Respiratory - Continue O2 as  needed to keep oxyen sat > 92%; home dose combivent and flovent 6. Cardiovascular - hold Dyazide 2/2 to hypokalemia; consider holding Simvastatin given elevated LFT's 7: GI -  Home doses of carafate, nexium, and docusate 8. FEN:  A. Hyponatremia - give NS IVF @ 125cc/hr; recheck CMET in AM  B. Hyperkalemia - s/p 40 meq KCl, recheck in AM   C. Vitamin D Deficiency - home dose vitamin D 9. Prophylaxis: If the patient is not actively bleeding, then give Heparin 5000 U [place] TID; otherwise SCD's 10. Disposition: patient will need to be afebrile for greater than 24 hours with evidence of resolving pneumonia before considering d/c   Amato Sevillano 10/12/2010, 2:09 AM

## 2010-10-13 LAB — BASIC METABOLIC PANEL
Calcium: 8.9 mg/dL (ref 8.4–10.5)
Chloride: 92 mEq/L — ABNORMAL LOW (ref 96–112)
Creatinine, Ser: 0.84 mg/dL (ref 0.50–1.35)
GFR calc Af Amer: >60 mL/min (ref >60)

## 2010-10-13 LAB — CBC
HCT: 27.4 % — ABNORMAL LOW (ref 39.0–52.0)
MCHC: 34.3 g/dL (ref 30.0–36.0)
RDW: 14.3 % (ref 11.5–15.5)

## 2010-10-13 LAB — DIFFERENTIAL
Basophils Absolute: 0 10*3/uL (ref 0.0–0.1)
Eosinophils Absolute: 0.1 10*3/uL (ref 0.0–0.7)
Eosinophils Relative: 1 % (ref 0–5)
Lymphocytes Relative: 9 % — ABNORMAL LOW (ref 12–46)
Monocytes Absolute: 1.3 10*3/uL — ABNORMAL HIGH (ref 0.1–1.0)

## 2010-10-14 LAB — BASIC METABOLIC PANEL
CO2: 31 mEq/L (ref 19–32)
Chloride: 94 mEq/L — ABNORMAL LOW (ref 96–112)
Sodium: 135 mEq/L (ref 135–145)

## 2010-10-14 LAB — CBC
Platelets: ADEQUATE 10*3/uL (ref 150–400)
RBC: 3.55 MIL/uL — ABNORMAL LOW (ref 4.22–5.81)
WBC: 12.4 10*3/uL — ABNORMAL HIGH (ref 4.0–10.5)

## 2010-10-14 LAB — DIFFERENTIAL
Basophils Absolute: 0 10*3/uL (ref 0.0–0.1)
Eosinophils Absolute: 0.2 10*3/uL (ref 0.0–0.7)
Monocytes Absolute: 1 10*3/uL (ref 0.1–1.0)
Neutrophils Relative %: 76 % (ref 43–77)

## 2010-10-18 LAB — CULTURE, BLOOD (ROUTINE X 2): Culture: NO GROWTH

## 2010-10-19 LAB — BASIC METABOLIC PANEL
BUN: 5 — ABNORMAL LOW
BUN: 8
CO2: 26
CO2: 27
Calcium: 8 — ABNORMAL LOW
Calcium: 8.1 — ABNORMAL LOW
Chloride: 108
Chloride: 96
Chloride: 99
Creatinine, Ser: 0.95
Creatinine, Ser: 0.96
Creatinine, Ser: 1.02
GFR calc Af Amer: >60
GFR calc Af Amer: >60
GFR calc Af Amer: >60
GFR calc non Af Amer: >60
Glucose, Bld: 81
Glucose, Bld: 97
Potassium: 3.8
Sodium: 141

## 2010-10-19 LAB — TYPE AND SCREEN

## 2010-10-19 LAB — COMPREHENSIVE METABOLIC PANEL
ALT: 21
Alkaline Phosphatase: 80
BUN: 7
CO2: 29
Chloride: 95 — ABNORMAL LOW
GFR calc non Af Amer: >60
Glucose, Bld: 98
Potassium: 2.8 — ABNORMAL LOW
Sodium: 133 — ABNORMAL LOW
Total Bilirubin: 0.6
Total Protein: 6.9

## 2010-10-19 LAB — HEMOGLOBIN AND HEMATOCRIT, BLOOD
HCT: 30.5 — ABNORMAL LOW
HCT: 32.2 — ABNORMAL LOW
HCT: 32.8 — ABNORMAL LOW
HCT: 33.5 — ABNORMAL LOW
HCT: 35 — ABNORMAL LOW
Hemoglobin: 10.5 — ABNORMAL LOW
Hemoglobin: 10.9 — ABNORMAL LOW
Hemoglobin: 11 — ABNORMAL LOW
Hemoglobin: 11.6 — ABNORMAL LOW

## 2010-10-19 LAB — CARDIAC PANEL(CRET KIN+CKTOT+MB+TROPI)
Relative Index: INVALID
Relative Index: INVALID
Total CK: 50
Total CK: 65
Troponin I: 0.01
Troponin I: 0.01

## 2010-10-19 LAB — CBC
HCT: 28.7 — ABNORMAL LOW
HCT: 28.8 — ABNORMAL LOW
HCT: 32.2 — ABNORMAL LOW
Hemoglobin: 10.4 — ABNORMAL LOW
Hemoglobin: 9.2 — ABNORMAL LOW
MCHC: 32.2
MCHC: 32.5
MCV: 95.8
MCV: 97.1
Platelets: 499 — ABNORMAL HIGH
Platelets: 660 — ABNORMAL HIGH
RBC: 2.96 — ABNORMAL LOW
RBC: 3.36 — ABNORMAL LOW
RDW: 16.6 — ABNORMAL HIGH
RDW: 17.1 — ABNORMAL HIGH
RDW: 17.4 — ABNORMAL HIGH
WBC: 10.4
WBC: 14.1 — ABNORMAL HIGH

## 2010-10-19 LAB — ABO/RH: ABO/RH(D): O POS

## 2010-10-19 LAB — PREPARE RBC (CROSSMATCH)

## 2010-10-25 ENCOUNTER — Encounter: Payer: Self-pay | Admitting: Family Medicine

## 2010-10-25 NOTE — Discharge Summary (Signed)
Physician Discharge Summary  [name] MRN: [id number] DOB/AGE: 74/19/1937 74 y.o.  Admit date: 10/12/10 Discharge date: 10/15/10  Admission Diagnoses: Community Acquired Pneumonia   Discharge Diagnoses:  1. Community Acquired Pneumonia, resolving 2. COPD Exacerbation 3. Psychosis, unspecified 4. Mental Retardation  5. Hypokalemia     Discharged Condition: stable  Hospital Course: [name] [name] is a 76 year old gentleman with mental retardation and COPD who presented to [name] ED on 10/11/10 from his group after after several days of cough, fever, and confusion. In [name] ED, he was found to be hypokalemic to 2.9 and hyponatremic to 126. For this he was treated with NS IVF with 40 mEq of KCl. Also, he had elevated transaminases: AST 102, ALT 96. He also was found to have a left sided pneumonia per [name] chest x-ray as well as an O2 requirements. [name] [name] was given 1 gram of rocephin and azithromycin and transferred to [name] for further care. [name] [name] remained afebrile in [name] hospital and continue to receive rocephin x 3 days total. Thereafter, he was transitioned to moxifloxacin for treatment of CAP. During his hospitalization, he also had an exacerbation of his COPD. For this he was given duonebs and 02 via nasal canula. He was transitioned to his home medications and no longer required O2 after 9/23. [name] [name] was stable and ready for discharge on 9/24.   Consults: none  Significant Diagnostic Studies: labs: K+ 2.9 -> 3.3, Na 126 -> 135, AST 102 -> 171 ALT 96 -> 97, microbiology: blood culture: negative and urine culture: negative and radiology: CXR: infiltrates: upper and lower lobe on [name] left  Treatments: antibiotics: ceftriaxone and azithromycin and respiratory therapy: O2 and albuterol/atropine nebulizer  Discharge Exam: There were no vitals taken for this visit. General appearance: alert, cooperative and no distress Resp: rales LLL Cardio: regular rate and rhythm,  S1, S2 normal, no murmur, click, rub or gallop  Disposition: Home or Self Care  Discharge Issues: 1. Hypokalemia 2. Transaminitis  -A repeat check of [name] [name] LFT's showed an increase on 9/21. [name] cause was undetermined and [name] lab was not repeated prior to discharge.  3. Resolution of CAP.   Discharge Orders    Future Appointments: Provider: Department: Dept Phone: Center:   10/26/2010 2:30 PM Edd Ar[facility] Resident [phone] [facility]     Discharge Medications: 1. FeSO4 325mg  po daily 2. Moxifloxacin 400 mg PO daily x 8 days 3. Atrificial Tears 4. Athlete's Foot 1% cream 5. Benztropine 1 mg PO daily 6. Carafate 10 mL PO four times daily 7. Combivent 2 puffs inhaled four times daily 8. Docusate 100mg  PO daily 9. Eucerin cream 10. Flovent HFA 220 mcg 1 puff twice daily 11. Latuda 80mg  PO 1.5 tabs daily 12. Nameda 10 mg PO twice daily 13. Nexium 40 mg PO twice daily 14. KCl 20 mEq PO daily 15. Simvastatin 20 mg PO daily 16. Thioridazine 50 mg PO 2 tabs daily 17. Triamterene/HCTZ 37.5/25 PO daily 18. Vitamin D3 1000 Units PO, 5 capsules daily    Signed: Mat Carne 10/25/2010, 2:02 PM

## 2010-10-26 ENCOUNTER — Ambulatory Visit (INDEPENDENT_AMBULATORY_CARE_PROVIDER_SITE_OTHER): Payer: Medicare Other | Admitting: Family Medicine

## 2010-10-26 ENCOUNTER — Encounter: Payer: Self-pay | Admitting: Family Medicine

## 2010-10-26 VITALS — BP 131/81 | HR 86 | Temp 98.6°F | Ht 68.0 in | Wt 160.5 lb

## 2010-10-26 DIAGNOSIS — J189 Pneumonia, unspecified organism: Secondary | ICD-10-CM

## 2010-10-26 LAB — IRON AND TIBC

## 2010-10-26 LAB — VITAMIN B12: Vitamin B-12: 361 pg/mL (ref 211–911)

## 2010-10-26 MED ORDER — ZOSTER VACCINE LIVE 19400 UNT/0.65ML ~~LOC~~ SOLR: 0.6500 mL | Freq: Once | SUBCUTANEOUS | Status: DC

## 2010-10-26 NOTE — Patient Instructions (Signed)
Pneumonia Pneumonia is an infection of the lungs. It may be caused by a bacteria or virus. Most forms are bacterial. Usually, these infections are caused by breathing infectious particles into the lungs (respiratory tract). SYMPTOMS   The most common problems (symptoms) are:    Cough.   Fever.    Chest pain.   Increased rate of breathing.     Wheezing.   Mucus production.     DIAGNOSIS   Often these infections are diagnosed on exam by your caregiver. Sometimes the diagnosis may require:    Chest X-rays.   Blood analysis.   Cultures. Blood cultures may be done to help find the cause of your pneumonia.  Your caregiver may do tests (blood gasses or pulse oximetry) to see how well your lungs are working. TREATMENT The bacterial pneumonias generally respond well to medicines (antibiotics) that kill germs. Viral infections must run their course. These infections will not respond to antibiotics. A pneumococcal shot (vaccine) is available to prevent a common bacterial pneumonia. This is usually suggested for the elderly and for other groups of higher risk individuals, such as those on chemotherapy or those who have problems with their immune system.  You will have pneumococcal screening or vaccination if you are over 61 years old and are not immunized.   If you are a smoker, it is time to quit. You may receive instructions on how to best stop smoking. Your caregiver can provide medicines and counseling to help you quit.  HOME CARE INSTRUCTIONS  Cough suppressants may be used if you are losing too much rest. However, coughing protects you by clearing your lungs. This is one reason for not using cough suppressants, if able, as they take away this protection.   Your caregiver may have prescribed an antibiotic if she or he feels your cough is caused by a bacterial infection. Take all your medicine until you are finished.   Your caregiver may also prescribe an expectorant to loosen the mucus to  be coughed up.   Only take over-the-counter or prescription medicines for pain, discomfort, or fever as directed by your caregiver.   Smoking is a common cause of bronchitis and can contribute to pneumonia. Stopping this habit is an important self-help step.   If you are a smoker and continue to smoke, your cough may last several weeks after your pneumonia has cleared.   A cold steam vaporizer or humidifier in your room or home may help loosen mucus.   Coughing is often worse at night. Sleeping in a semi-upright position in a recliner or using a couple pillows under your head will help with this.   Get rest as you feel it is needed. Your body will usually let you know when to rest.  SEEK IMMEDIATE MEDICAL CARE IF:  You develop pus-like mucus (sputum) or your illness becomes worse. This is especially true if you are elderly or weakened from any other disease.   You cannot control your cough with suppressants and are losing sleep.   You begin coughing up blood.   You develop pain which is getting worse or is uncontrolled with medicines.   You or your child has an oral temperature above 100.4, not controlled by medicine.   Any of the symptoms which initially brought you in for treatment are getting worse rather than better.   You develop shortness of breath or chest pain.  MAKE SURE YOU:    Understand these instructions.   Will watch your condition.  Will get help right away if you are not doing well or get worse.  Document Released: 01/07/2005 Document Re-Released: 06/27/2009 [facility] Patient Information 2011 [place], [place].

## 2010-10-26 NOTE — Progress Notes (Signed)
  Subjective:     [name] [name] is a 76 y.o. male here for evaluation of a cough. Onset of symptoms was a few weeks ago. Symptoms have been completely resolved since that time. The cough is resolved and is aggravated by nothing. Associated symptoms include: wheezing. Patient does have a history of asthma. Patient does not have a history of environmental allergens. Patient has not traveled recently. Patient does not have a history of smoking. Patient has had a previous chest x-ray. Patient has not had a PPD done.  The following portions of the patient's history were reviewed and updated as appropriate: allergies, current medications, past family history, past medical history, past social history, past surgical history and problem list.  Review of Systems Pertinent items are noted in HPI.    Objective:    Oxygen saturation 95% on room air BP 131/81  Pulse 86  Temp(Src) 98.6 F (37 C) (Oral)  Ht 5\' 8"  (1.727 m)  Wt 160 lb 8 oz (72.802 kg)  BMI 24.40 kg/m2  SpO2 95% General appearance: alert and cooperative Head: Normocephalic, without obvious abnormality, atraumatic Lungs: rales bibasilar and bilaterally and rhonchi bibasilar and bilaterally Heart: regular rate and rhythm, S1, S2 normal, no murmur, click, rub or gallop    Assessment:    Asthma and Pneumonia that has resolved   Plan:     Avelox PO has finished Continue using PRN albuterol Followed closely at nursing facility.

## 2010-11-02 ENCOUNTER — Other Ambulatory Visit: Payer: Self-pay | Admitting: Family Medicine

## 2010-11-02 LAB — BASIC METABOLIC PANEL
BUN: 12
CO2: 26
Calcium: 8.7
Chloride: 95 — ABNORMAL LOW
GFR calc Af Amer: >60
GFR calc non Af Amer: >60
Glucose, Bld: 127 — ABNORMAL HIGH
Potassium: 3.1 — ABNORMAL LOW
Potassium: 3.6
Sodium: 136

## 2010-11-02 LAB — URINE MICROSCOPIC-ADD ON

## 2010-11-02 LAB — CBC
HCT: 38.5 — ABNORMAL LOW
HCT: 44.9
Hemoglobin: 13
MCHC: 33.7
MCV: 92.6
Platelets: 369
RBC: 4.17 — ABNORMAL LOW
RDW: 15 — ABNORMAL HIGH
WBC: 15.6 — ABNORMAL HIGH
WBC: 18.9 — ABNORMAL HIGH

## 2010-11-02 LAB — URINE CULTURE: Colony Count: 5000

## 2010-11-02 LAB — URINALYSIS, ROUTINE W REFLEX MICROSCOPIC
Bilirubin Urine: NEGATIVE
Glucose, UA: 250 — AB
Specific Gravity, Urine: 1.01
Urobilinogen, UA: 0.2
pH: 6

## 2010-11-02 LAB — DIFFERENTIAL
Basophils Relative: 0
Eosinophils Absolute: 1.2 — ABNORMAL HIGH
Eosinophils Relative: 7 — ABNORMAL HIGH
Monocytes Relative: 10
Neutrophils Relative %: 69

## 2010-11-02 LAB — CULTURE, BLOOD (ROUTINE X 2)

## 2010-11-02 LAB — LEGIONELLA ANTIGEN, URINE: Legionella Antigen, Urine: NEGATIVE

## 2010-11-02 MED ORDER — FLUTICASONE PROPIONATE HFA 220 MCG/ACT IN AERO: 1.0000 | INHALATION_SPRAY | Freq: Two times a day (BID) | RESPIRATORY_TRACT | Status: DC

## 2010-11-02 MED ORDER — CETIRIZINE HCL 10 MG PO CHEW: 10.0000 mg | CHEWABLE_TABLET | Freq: Every day | ORAL | Status: DC

## 2010-11-06 NOTE — Discharge Summary (Signed)
[name] [name], LOPPNOW NO.:  [id number]  MEDICAL RECORD NO.:  [id number]  LOCATION:                                 FACILITY:  PHYSICIAN:  Pearlean Brownie, M.D.DATE OF BIRTH:  10/09/1935  DATE OF ADMISSION:  10/12/2010 DATE OF DISCHARGE:  10/15/2010                              DISCHARGE SUMMARY   ADMISSION DIAGNOSIS:  Community-acquired pneumonia.  DISCHARGE DIAGNOSES: 1. Community-acquired pneumonia, resolving. 2. Chronic obstructive pulmonary disease exacerbation. 3. Psychosis, unspecified. 4. Mental retardation. 5. Hypokalemia.  DISCHARGE CONDITION:  Stable.  HOSPITAL COURSE:  [name] [name] is a 76 year old gentleman with mental retardation and COPD who presented to the ED on October 11, 2010, from his group home after several days of cough, fever and confusion.  In the ED, he was found to be hypokalemia to 2.9 and hyponatremia to 126. For this, he was treated with normal saline IV fluid with 40 mEq of KCl. Also, he had elevated transaminases:  AST 102, ALT 96.  He was also found to have a left-sided pneumonia for the chest x-ray as well as an oxygen requirement.  [name] [name] was given 1 g Rocephin and 500 mg p.o. azithromycin and transferred to the [facility] for further care.  [name] [name] remained afebrile while in the hospital, continued to receive Rocephin x3 days total.  Thereafter, he was transitioned to moxifloxacin for treatment of community-acquired pneumonia.  During his hospitalization, he also had an exacerbation of COPD.  For this, he was given DuoNeb and O2 via nasal cannula.  He was transitioned to his home medications and no longer required O2 after September 23.  The patient was stable and ready for discharged on September 24.  CONSULTS:  None significant.  DIAGNOSTIC STUDIES AND LABS:  Potassium 2.9 on the day of admission, 3.3 on day of discharge.  Sodium 126 on day of admission, 135 on day of discharge.   AST on day of admission 102, 171 on September 21.  ALT 96 on day of admission and 97 on September 21.  MICROBIOLOGY:  Blood culture:  Negative and urine culture negative.  RADIOLOGY:  Chest x-ray showed infiltrates of the upper and lower left lobes.  TREATMENT:  Antibiotics:  Ceftriaxone, azithromycin and respiratory therapy.  DISCHARGE EXAM:  GENERAL APPEARANCE:  Alert, cooperative, in no distress. RESPIRATORY:  Rales in left lower lobe. CARDIOVASCULAR:  Regular rate and rhythm.  No murmurs, rubs or gallops.  DISPOSITION:  Discharge to group home.  DISCHARGE ISSUES: 1. Hypokalemia. 2. Transaminitis/ a repeat check of his LFTs showed an increase on     September 21 and the cause was undetermined and lab was not     repeated prior to discharge. 3. Resolution of community-acquired pneumonia.  DISCHARGE MEDICATIONS: 1. Ferrous sulfate 325 mg p.o. daily. 2. Moxifloxacin 400 mg p.o. daily x8 days. 3. Artificial tears. 4. Athlete's foot 1% cream. 5. Benztropine 1 mg p.o. daily. 6. Carafate 10 mL p.o. 4 times daily. 7. Combivent 2 puffs inhaled 4 times daily. 8. Docusate 100 mg daily. 9. Eucerin cream. 10.Flovent HFA 220 mcg 1 puff daily. 11.Latuda 80 mg p.o. 1.5 tablets daily. 12.Namenda 10 mg p.o. twice  daily. 13.Nexium 40 mg p.o. twice daily. 14.KCl 20 mEq p.o. daily. 15.Simvastatin 20 mg p.o. daily. 16.Thioridazine 50 mg p.o. 2 tablets daily. 17.Triamterene/HCTZ 37.5/25 mg p.o. daily 18.Vitamin D 1000 units p.o. 5 capsules daily.  Follow-up appointments with primary care physician, Dr. Edd Arbour on October 5th at 2:30 p.m.    ______________________________ Mat Carne, MD   ______________________________ Pearlean Brownie, M.D.    EW/MEDQ  D:  10/25/2010  T:  10/25/2010  Job:  [id number]  Electronically Signed by Mat Carne  on 10/29/2010 04:01:12 PM Electronically Signed by Pearlean Brownie M.D. on 11/06/2010 11:91:47 PM

## 2010-11-06 NOTE — H&P (Signed)
[name] [name], [name] [name] NO.:  [id number]  MEDICAL RECORD NO.:  [id number]  LOCATION:                                 FACILITY:  PHYSICIAN:  Pearlean Brownie, M.D.DATE OF BIRTH:  10/09/1935  DATE OF ADMISSION:  10/12/2010 DATE OF DISCHARGE:                             HISTORY & PHYSICAL   ATTENDING PHYSICIAN:  Pearlean Brownie, MD  CHIEF COMPLAINT:  Pneumonia, community-acquired.  HISTORY OF PRESENT ILLNESS:  [name] [name] is a 76 year old gentleman with mental retardation who lives in a group home and presented with a 1-day history of fever and urinary continence to the ED of [facility].  At the time of this H and P, the home's manager was not present and the patient was incapable of providing details of his history.  In the ED, the patient demonstrated hyponatremia and hypokalemia on complete metabolic panel.  For this, the patient was given 40 mEq of KCl and normal saline IV fluid respectively.  Also, the patient had a white blood cell count of 28.3 and a chest x-ray revealed a left upper lobe and left lower lobe opacities.  Therefore, [name] [name] was given Rocephin 1 g IV and azithromycin 500 mg p.o.  He was also placed on oxygen 2 liters via nasal cannula with oxygen saturation 90% and he was given acetaminophen for fever.  He was also given nebulized albuterol for respiratory distress.  [name] [name] was then transferred to [facility] for treatment of presumed community- acquired pneumonia.  PAST MEDICAL HISTORY: 1. Mental retardation. 2. Psychosis. 3. Hyperlipidemia. 4. Hypokalemia. 5. Iron-deficiency anemia. 6. Bell palsy. 7. Benign systemic hypertension. 8. Asthma. 9. COPD. 10.Esophageal ulcer with bleeding, gastroesophageal reflux. 11.Barrett esophagus. 12.Hiatal hernia. 13.Cholelithiasis. 14.GI bleeding. 15.Eczema.  PAST SURGICAL HISTORY:  Unknown.  FAMILY HISTORY:  Unknown.  SOCIAL HISTORY:  The patient  lives in a group home.  He does not smoke. Currently, he does not drink alcohol or use recreational drugs.  ALLERGIES:  PENICILLIN.  MEDICATIONS PRIOR TO ADMISSION: 1. Latuda 80 mg tablet p.o. 1 tablet daily. 2. Latuda 40 mg tablet p.o. 1 tablet daily. 3. Combivent inhaler 2 puffs 4 times daily. 4. Vitamin D3 1000 units p.o. 5 capsules daily. 5. Thioridazine 50 mg tablet p.o. 2 tablets daily at bedtime. 6. Eucerin cream 1 applicator twice daily. 7. Flovent HFA 220 mcg inhaled, dose 1 puff twice daily. 8. Athlete's foot 1% cream, dose 1 applicator twice daily. 9. Triamterene/hydrochlorothiazide 37.5/25 mg p.o., dose 1 capsule     daily. 10.Carafate 1 mg/10 mL suspension p.o., dose 1 g 4 times daily. 11.Simvastatin 20 mg p.o., dose 1 tablet daily at bedtime. 12.Potassium chloride 20 mEq p.o., dose 1 tablet daily. 13.Namenda 10 mg p.o., dose 1 tablet twice daily. 14.Nexium 40 mg p.o. 1 capsule twice daily. 15.Docusate 100 mg p.o., dose 1 capsule twice daily. 16.Benztropine 1 mg p.o., dose 1 tablet daily at bedtime. 17.Artificial tears both eyes, dose 2 drops 3 times a day.  LABORATORY DATA:  Labs collected upon admission to the ED: Comprehensive metabolic panel:  Sodium 126, potassium 2.9, chloride 87, CO2 of 28, glucose 185, BUN 17, serum creatinine 1.0, calcium  9.0. Total protein 8.0, albumin 2.8, AST 102, ALT 96, alkaline phosphatase 105, total bilirubin 0.5.  GFR greater than 60.  CBC:  WBC 28.3, RBC 3.51, hemoglobin 10.2, hematocrit 30.0, MCV 85.5, MCH 29.1, MCHC 34.0, RDW 14.2.  Platelets, note count may be inaccurate due to fibrin clumps.  Venous lactic acid 1.3.  Procalcitonin 0.57.  Urinalysis:  Color yellow, appearance clear, specific gravity 1.016, pH 6.5, glucose negative, hemoglobin large, bilirubin negative, ketones negative, protein 30, urobilinogen 1.0, nitrite negative, leukocyte negative.  Microscopic add-on:  White blood cell count 3-6, red blood cells per  high powered field, 7-10 hyaline casts.  A 2-view chest x-ray radiology report: Impression: 1. Consolidation in the left lung apex with patchy opacities in the     rest of the left upper lung.  Left upper lobe and left lower lobe,     suspicious for left upper lobe and left lower lobe pneumonia. 2. Subsegmental atelectasis or scarring in the right lung base. 3. Adenopathy or ectatic vascular along the right mediastinal margin.  REVIEW OF SYSTEMS:  Unable to perform review of systems due to the patient's mental state.  PHYSICAL EXAMINATION:  VITAL SIGNS:  Temperature 101.9, blood pressure 120/80, respiratory rate 31, pulse 107, oxygen saturation 92% on room air. GENERAL:  Constitutionally appears well developed and well nourished. The patient is pleasant, interactive, but not coherent with questioning secondary to retardation. HEAD:  Normocephalic and atraumatic. MOUTH/THROAT:  Uvula is midline.  Mucous membranes are dry.  No oropharyngeal exudates.  Posterior oropharyngeal erythema or tonsillar abscesses.  Poor dentition and dry, cracked lips. EYES:  Conjunctiva and extraocular movements are normal.  Pupils are equal, round, and reactive to light. NECK:  Trachea normal.  Neck is supple.  No masses and no thyromegaly present. CARDIOVASCULAR:  Normal rate and rhythm.  No murmurs. RESPIRATORY:  He had decreased breath sounds in the left upper field and the left lower field.  He has rales in the right lower field. GI:  Normal appearance and bowel sounds are normal.  No tenderness. GENITOURINARY:  Cannon catheter in place secondary to urinary incontinence. MUSCULOSKELETAL:  A 4/5 strength in upper extremities bilaterally, full grip strength.  The patient needed assistance to remain in the seated position with __________ and his weakness was difficult to discern.  No tenderness or swelling of the lower extremities. LYMPHADENOPATHY:  No submental or submandibular adenopathy  present. NEUROLOGICAL:  He is alert.  The patient does not appear to have any focal deficit, but claims that he cannot walk or stand on his own because he will fall.  The patient has normal muscle tone in all extremities. PSYCHIATRIC:  The patient is pleasant, but poor conversation.  No evidence of response to internal stimuli.  ASSESSMENT AND PLAN:  [name] [name] is a very tiny 76 year old gentleman with mental retardation who presents with community-acquired pneumonia of the left lung causing mild respiratory distress. 1. Admit the patient to [facility] Medicine Teaching Service under Dr.     Pearlean Brownie. 2. Infectious disease.  Continue Rocephin 1 g IV daily and     azithromycin 500 mg b.i.d. for community-acquired pneumonia.  For     fever, acetaminophen 650 mg p.r.n. 3. Hematologic.     a.     Anemia.  Check anemia panel and fecal occult blood tests.      Order CBC with diff in the morning.     b.     Leukocytosis.  Check CBC in the a.m. 4.  Psychiatric.  Continue home doses of Latuda, Namenda, and     thioridazine. 5. Respiratory.  Continue O2 as needed to keep oxygen saturation     greater than or equal to 92%, give home dose of Combivent and     Flovent. 6. Cardiovascular.  Hold Dyazide secondary to hypokalemia; consider     holding simvastatin given elevated liver function tests. 7. Gastrointestinal.  Give home doses of Carafate, Nexium, and     docusate. 8. Fluids, electrolytes, and nutrition.     a.     Hyponatremia.  Give normal saline IV fluid at 125 mL an      hour.  Recheck complete metabolic panel in the morning.     b.     Hyperkalemia status post 40 mEq KCl, recheck in the a.m.     c.     Vitamin D deficiency, home dose of vitamin D. 9. Prophylaxis.  If the patient is not actively bleeding, then heparin     5000 units subcutaneously t.i.d., otherwise     use SCDs. 10.Disposition.  The patient need to be afebrile for greater than 24     hours with evidence of  resolving pneumonia before considering     discharge to group home.    ______________________________ Mat Carne, MD   ______________________________ Pearlean Brownie, M.D.    EW/MEDQ  D:  10/12/2010  T:  10/12/2010  Job:  [id number]  Electronically Signed by Mat Carne  on 10/29/2010 04:01:16 PM Electronically Signed by Pearlean Brownie M.D. on 11/06/2010 02:28:10 PM

## 2010-11-26 ENCOUNTER — Telehealth: Payer: Self-pay | Admitting: Family Medicine

## 2010-11-26 NOTE — Telephone Encounter (Signed)
There was a refill request on Ferrous Sulfate from [facility] that was sent last week and they have not heard back anything yet.  Everything accept that one was refilled and he wasn't sure if there was a reason for this.

## 2010-11-27 MED ORDER — FERROUS SULFATE 325 (65 FE) MG PO TABS: 325.0000 mg | ORAL_TABLET | Freq: Every day | ORAL | Status: DC

## 2010-11-27 NOTE — Telephone Encounter (Signed)
Refilled Iron pills.

## 2011-01-09 ENCOUNTER — Ambulatory Visit (INDEPENDENT_AMBULATORY_CARE_PROVIDER_SITE_OTHER): Payer: Medicare Other | Admitting: Family Medicine

## 2011-01-09 VITALS — BP 95/62 | HR 98 | Temp 98.0°F | Ht 68.0 in | Wt 161.0 lb

## 2011-01-09 DIAGNOSIS — J069 Acute upper respiratory infection, unspecified: Secondary | ICD-10-CM

## 2011-01-09 MED ORDER — DM-GUAIFENESIN ER 30-600 MG PO TB12: 1.0000 | ORAL_TABLET | Freq: Two times a day (BID) | ORAL | Status: AC

## 2011-01-09 MED ORDER — BENZONATATE 100 MG PO CAPS: 100.0000 mg | ORAL_CAPSULE | Freq: Three times a day (TID) | ORAL | Status: AC | PRN

## 2011-01-09 NOTE — Patient Instructions (Signed)
Instructions were written on patient's summary from [facility].

## 2011-01-09 NOTE — Assessment & Plan Note (Signed)
Schedule albuterol every 4 hours for 48 hours, then every 4 hours when necessary. Prescriptions given for Mucinex and Tessalon Perles for cough. Encouraged plenty of fluids and to the breast. Return to clinic as necessary.

## 2011-01-09 NOTE — Progress Notes (Signed)
  Subjective:    Patient ID: [name] [name], male    DOB: 01-17-1935, 76 y.o.   MRN: [id number]  HPI  Patient presents for a work in appointment for cough and audible wheezing. Patient is from [facility] group home.  Patient is hard of hearing and a poor historian. History is provided by caregiver. Patient has had productive cough and audible wheezing for 2-3 days. Patient has a history of asthma. Denies any nausea or vomiting, fever, chest pain, headache, fatigue. Denies any congestion, runny nose, sore throat.   Review of Systems Per history of present illness    Objective:   Physical Exam  Constitutional: No distress.  HENT:       MM dry  Neck: Neck supple.  Cardiovascular: Normal rate, regular rhythm and normal heart sounds.   Pulmonary/Chest: Effort normal.       Scattered wheezes anterior chest, but poor respiratory effort due to dementia  Abdominal: Soft. Bowel sounds are normal. He exhibits no distension. There is no tenderness.  Lymphadenopathy:    He has no cervical adenopathy.  Skin: Skin is warm and dry.  Psychiatric: He is slowed and withdrawn. He is noncommunicative.          Assessment & Plan:

## 2011-03-30 ENCOUNTER — Emergency Department (HOSPITAL_COMMUNITY)
Admission: EM | Admit: 2011-03-30 | Discharge: 2011-03-30 | Discharge status: Absent without leave | Payer: Self-pay | Source: Home / Self Care

## 2011-04-04 ENCOUNTER — Encounter: Payer: Self-pay | Admitting: Family Medicine

## 2011-04-04 ENCOUNTER — Ambulatory Visit (INDEPENDENT_AMBULATORY_CARE_PROVIDER_SITE_OTHER): Payer: Medicare Other | Admitting: Family Medicine

## 2011-04-04 VITALS — BP 102/78 | HR 76 | Temp 97.8°F | Ht 68.0 in | Wt 161.0 lb

## 2011-04-04 DIAGNOSIS — H109 Unspecified conjunctivitis: Secondary | ICD-10-CM | POA: Insufficient documentation

## 2011-04-04 NOTE — Progress Notes (Signed)
Discussed and examined patient with [facility]3- agree with documentation.  CC: follow-up left pinkeye  HPI:  approx 5 days ago went to urgent care and was prescribed azithromycin for ? Pinkeye.  Has now resolved and needs notes for group home.  Here with caregiver- patient is poor historian.  Denies eye pain, eye redness, eye crusting or change in vision.  PE:  GEN:  No acute distress Eye:  PERRLA, EOMI, no pain.  No conjunctival injection or discharge

## 2011-04-04 NOTE — Progress Notes (Signed)
  Subjective:    Patient ID: [name] [name], male    DOB: [birth year], 77 y.o.   MRN: [id number]  HPI Mr. [name] [name] is a 77 yo M with PMHx of HLD and GERD who presents for follow-up of orbital cellulitis.  Pt is accompanied by his caretaker who provides most of his history.  Pt had eye infection and went to Urgent Care on Saturday (3/9) where he was given azithromycin for 5 days.  Caregiver reports that pt took his last dose of antibiotic this morning.  Pt denies any eye pain, irritation, or discharge.  Pt denies cough, rhinorrhea, congestion, fever, earache, headache.  Caregiver reports vast improvement in symptoms since Saturday.   Review of Systems  All other systems reviewed and are negative.       Objective:   Physical Exam  Constitutional: He appears well-developed and well-nourished. No distress.  HENT:  Right Ear: External ear normal.  Left Ear: External ear normal.  Nose: Nose normal.  Mouth/Throat: Oropharynx is clear and moist. No oropharyngeal exudate.  Eyes: Conjunctivae and EOM are normal. Pupils are equal, round, and reactive to light.       Minimal conjunctival injection in left eye. No periorbital tenderness, erythema, or edema.  Cardiovascular: Normal rate, regular rhythm and normal heart sounds.  Exam reveals no gallop and no friction rub.   No murmur heard. Pulmonary/Chest: Effort normal and breath sounds normal. No respiratory distress. He has no wheezes. He has no rales.  Lymphadenopathy:    He has no cervical adenopathy.  Skin: He is not diaphoretic.   Blood pressure 102/78, pulse 76, temperature 97.8 F (36.6 C), temperature source Oral, height 5\' 8"  (1.727 m), weight 161 lb (73.029 kg).      Assessment & Plan:

## 2011-04-04 NOTE — Patient Instructions (Signed)
Thank you for visiting [place] today. Please make a follow-up appointment with your primary care doctor, Dr. Rivka Safer. Please call us if pain, redness, swelling, or vision changes occur.

## 2011-04-04 NOTE — Assessment & Plan Note (Addendum)
Pt presented for follow-up of pink eye.  No evidence of conjunctivitis or cellulitis.  May return to group home without restrictions.  Pt will follow-up with his PCP at next available for chronic medical conditions.

## 2011-05-08 ENCOUNTER — Emergency Department (INDEPENDENT_AMBULATORY_CARE_PROVIDER_SITE_OTHER)
Admission: EM | Admit: 2011-05-08 | Discharge: 2011-05-08 | Disposition: A | Discharge status: Nursing Home (Includes ICF) | Payer: Medicare Other | Source: Home / Self Care | Attending: Emergency Medicine | Admitting: Emergency Medicine

## 2011-05-08 ENCOUNTER — Encounter (HOSPITAL_COMMUNITY): Payer: Self-pay | Admitting: Emergency Medicine

## 2011-05-08 ENCOUNTER — Emergency Department (HOSPITAL_COMMUNITY)
Admission: EM | Admit: 2011-05-08 | Discharge: 2011-05-09 | Disposition: A | Discharge status: Nursing Home (Includes ICF) | Payer: Medicare Other | Attending: Emergency Medicine | Admitting: Emergency Medicine

## 2011-05-08 ENCOUNTER — Encounter (HOSPITAL_COMMUNITY): Payer: Self-pay | Admitting: *Deleted

## 2011-05-08 DIAGNOSIS — S199XXA Unspecified injury of neck, initial encounter: Secondary | ICD-10-CM

## 2011-05-08 DIAGNOSIS — Y9210 Unspecified residential institution as the place of occurrence of the external cause: Secondary | ICD-10-CM | POA: Insufficient documentation

## 2011-05-08 DIAGNOSIS — S0991XA Unspecified injury of ear, initial encounter: Secondary | ICD-10-CM

## 2011-05-08 DIAGNOSIS — K219 Gastro-esophageal reflux disease without esophagitis: Secondary | ICD-10-CM | POA: Insufficient documentation

## 2011-05-08 DIAGNOSIS — J4489 Other specified chronic obstructive pulmonary disease: Secondary | ICD-10-CM | POA: Insufficient documentation

## 2011-05-08 DIAGNOSIS — F79 Unspecified intellectual disabilities: Secondary | ICD-10-CM | POA: Insufficient documentation

## 2011-05-08 DIAGNOSIS — I10 Essential (primary) hypertension: Secondary | ICD-10-CM | POA: Insufficient documentation

## 2011-05-08 DIAGNOSIS — W503XXA Accidental bite by another person, initial encounter: Secondary | ICD-10-CM

## 2011-05-08 DIAGNOSIS — J449 Chronic obstructive pulmonary disease, unspecified: Secondary | ICD-10-CM | POA: Insufficient documentation

## 2011-05-08 DIAGNOSIS — S01309A Unspecified open wound of unspecified ear, initial encounter: Secondary | ICD-10-CM | POA: Insufficient documentation

## 2011-05-08 DIAGNOSIS — S01319A Laceration without foreign body of unspecified ear, initial encounter: Secondary | ICD-10-CM

## 2011-05-08 DIAGNOSIS — S0993XA Unspecified injury of face, initial encounter: Secondary | ICD-10-CM

## 2011-05-08 DIAGNOSIS — F0390 Unspecified dementia without behavioral disturbance: Secondary | ICD-10-CM | POA: Insufficient documentation

## 2011-05-08 HISTORY — DX: Essential (primary) hypertension: I10

## 2011-05-08 HISTORY — DX: Reserved for inherently not codable concepts without codable children: IMO0001

## 2011-05-08 HISTORY — DX: Anemia, unspecified: D64.9

## 2011-05-08 HISTORY — DX: Unspecified dementia, unspecified severity, without behavioral disturbance, psychotic disturbance, mood disturbance, and anxiety: F03.90

## 2011-05-08 HISTORY — DX: Personal history of other diseases of the digestive system: Z87.19

## 2011-05-08 HISTORY — DX: Chronic obstructive pulmonary disease, unspecified: J44.9

## 2011-05-08 HISTORY — DX: Personal history of peptic ulcer disease: Z87.11

## 2011-05-08 HISTORY — DX: Gastro-esophageal reflux disease without esophagitis: K21.9

## 2011-05-08 MED ORDER — TETANUS-DIPHTH-ACELL PERTUSSIS 5-2.5-18.5 LF-MCG/0.5 IM SUSP
0.5000 mL | Freq: Once | INTRAMUSCULAR | Status: AC
  Administered 2011-05-08: 0.5 mL via INTRAMUSCULAR
  Filled 2011-05-08: qty 0.5

## 2011-05-08 MED ORDER — CLINDAMYCIN PHOSPHATE 600 MG/50ML IV SOLN
600.0000 mg | Freq: Once | INTRAVENOUS | Status: AC
  Administered 2011-05-08: 600 mg via INTRAVENOUS
  Filled 2011-05-08: qty 50

## 2011-05-08 MED ORDER — LIDOCAINE-EPINEPHRINE 1 %-1:100000 IJ SOLN
INTRAMUSCULAR | Status: AC
  Filled 2011-05-08: qty 1

## 2011-05-08 MED ORDER — TETANUS-DIPHTH-ACELL PERTUSSIS 5-2.5-18.5 LF-MCG/0.5 IM SUSP
0.5000 mL | Freq: Once | INTRAMUSCULAR | Status: AC
  Administered 2011-05-08: 0.5 mL via INTRAMUSCULAR
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
## 2011-05-08 NOTE — ED Notes (Signed)
Unknown as  To the  Pt last  Tetanus  Shot

## 2011-05-08 NOTE — ED Notes (Signed)
Unsure of last tetanus shot. 
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
## 2011-05-08 NOTE — ED Notes (Signed)
ENT here to suture left ear.  Used sutures and Dermabond
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
## 2011-05-08 NOTE — ED Notes (Signed)
Pt  On furthur  evaul  Has  Large  Lac  To  The l  Ear  Area    In cartilage  Are           Bleeding  Subsided
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
## 2011-05-09 MED ORDER — CLINDAMYCIN HCL 150 MG PO CAPS: 150.0000 mg | ORAL_CAPSULE | Freq: Four times a day (QID) | ORAL | Status: AC

## 2011-05-09 NOTE — Discharge Instructions (Signed)
Human Bite Human bite wounds tend to become infected, even when they seem minor at first. Bite wounds of the hand can be serious because the tendons and joints are close to the skin. Infection can develop very rapidly, even in a matter of hours.  DIAGNOSIS  Your caregiver will most likely:  Take a detailed history of the bite injury.   Perform a wound exam.   Take your medical history.  Blood tests or X-rays may be performed. Sometimes, infected bite wounds are cultured and sent to a lab to identify the infectious bacteria. TREATMENT  Medical treatment will depend on the location of the bite as well as the patient's medical history. Treatment may include:  Wound care, such as cleaning and flushing the wound with saline solution, bandaging, and elevating the affected area.   Antibiotic medicine.   Tetanus immunization.   Leaving the wound open to heal. This is often done with human bites due to the high risk of infection. However, in certain cases, wound closure with stitches, wound adhesive, skin adhesive strips, or staples may be used.  Infected bites that are left untreated may require intravenous (IV) antibiotics and surgical treatment in the hospital. HOME CARE INSTRUCTIONS  Follow your caregiver's instructions for wound care.   Take all medicines as directed.   If your caregiver prescribes antibiotics, take them as directed. Finish them even if you start to feel better.   Follow up with your caregiver for further exams or immunizations as directed.  You may need a tetanus shot if:  You cannot remember when you had your last tetanus shot.   You have never had a tetanus shot.   The injury broke your skin.  If you get a tetanus shot, your arm may swell, get red, and feel warm to the touch. This is common and not a problem. If you need a tetanus shot and you choose not to have one, there is a rare chance of getting tetanus. Sickness from tetanus can be serious. SEEK IMMEDIATE  MEDICAL CARE IF:  You have increased pain, swelling, or redness around the bite wound.   You have chills.   You have a fever.   You have pus draining from the wound.   You have red streaks on the skin coming from the wound.   You have pain with movement or trouble moving the injured part.   You are not improving, or you are getting worse.   You have any other questions or concerns.  MAKE SURE YOU:  Understand these instructions.   Will watch your condition.   Will get help right away if you are not doing well or get worse.  Document Released: 02/15/2004 Document Revised: 12/27/2010 Document Reviewed: 08/29/2010 [facility] Patient Information 2012 [place], [place].   Use tylenol if needed for pain.  Take the antibiotic as prescribed.  Get rechecked immediately for any signs of infection such as redness,  Swelling,  Increased pain or drainage of pus.

## 2011-05-10 NOTE — ED Provider Notes (Signed)
Medical screening examination/treatment/procedure(s) were conducted as a shared visit with non-physician practitioner(s) and myself.  I personally evaluated the patient during the encounter   [name] Booze, MD 05/10/11 [phone]

## 2011-06-04 ENCOUNTER — Other Ambulatory Visit: Payer: Self-pay | Admitting: Family Medicine

## 2011-06-04 MED ORDER — IPRATROPIUM-ALBUTEROL 20-100 MCG/ACT IN AERS: 1.0000 | INHALATION_SPRAY | Freq: Four times a day (QID) | RESPIRATORY_TRACT | Status: DC | PRN

## 2011-10-17 ENCOUNTER — Telehealth: Payer: Self-pay | Admitting: Family Medicine

## 2011-10-17 NOTE — Telephone Encounter (Signed)
Needs a copy of the order to continue Ensure once every other day.  Fax # [phone].

## 2011-10-17 NOTE — Telephone Encounter (Signed)
Forwarded to pcp.Francesa Eugenio Lynetta  

## 2011-10-21 ENCOUNTER — Other Ambulatory Visit: Payer: Self-pay | Admitting: *Deleted

## 2011-10-21 MED ORDER — ENSURE PO LIQD: ORAL | Status: DC

## 2011-12-12 ENCOUNTER — Ambulatory Visit (INDEPENDENT_AMBULATORY_CARE_PROVIDER_SITE_OTHER): Payer: Medicare Other | Admitting: Family Medicine

## 2011-12-12 ENCOUNTER — Ambulatory Visit
Admission: RE | Admit: 2011-12-12 | Discharge: 2011-12-12 | Disposition: A | Discharge status: Home / Self Care | Payer: Medicare Other | Source: Ambulatory Visit | Attending: Family Medicine | Admitting: Family Medicine

## 2011-12-12 VITALS — BP 124/80 | HR 101 | Ht 68.0 in | Wt 156.0 lb

## 2011-12-12 DIAGNOSIS — R0789 Other chest pain: Secondary | ICD-10-CM | POA: Insufficient documentation

## 2011-12-12 DIAGNOSIS — W108XXA Fall (on) (from) other stairs and steps, initial encounter: Secondary | ICD-10-CM | POA: Insufficient documentation

## 2011-12-12 DIAGNOSIS — R071 Chest pain on breathing: Secondary | ICD-10-CM

## 2011-12-12 NOTE — Assessment & Plan Note (Signed)
Polypharmacy, with psychosis, dementia.  I wrote a prescription for a walker but I am not sure if he will use it. Will have him seen in Geriatrics clinic for medication management and fall risk reduction.

## 2011-12-12 NOTE — Progress Notes (Signed)
  Subjective:    Patient ID: [name] [name], male    DOB: December 01, 1934, 77 y.o.   MRN: [id number]  HPI  Care givers bring [name] [name] in after a fall this morning on steps.  He landed on his right side.  He is minimally vocal, but will point to his right axillary line when asked where it hurts.    His care givers express concern about [name] [name]'s ambulatory safety.  He has fallen before.  He does not have a walker, but they think he would benefit from one.  He has never been seen in geriatrics clinic.    Review of Systems Unable to perform due to patient's non-vocal status.     Objective:   Physical Exam BP 124/80  Pulse 101  Ht 5\' 8"  (1.727 m)  Wt 156 lb (70.761 kg)  BMI 23.72 kg/m2 General appearance: alert and cooperative, pill rolling of right hand Chest wall: Tender to palpation under right arm.  CV: RRR, no m/r/g Pulm: CTAB, no decreased breath sounds Abd: +BS, NT/ND       Assessment & Plan:

## 2011-12-12 NOTE — Assessment & Plan Note (Signed)
Secondary to fall, will obtain CXR to rule out rib fracture.  Will advise tylenol as needed for pain.

## 2012-05-21 ENCOUNTER — Ambulatory Visit: Payer: Medicare Other | Admitting: Family Medicine

## 2012-10-01 ENCOUNTER — Telehealth: Payer: Self-pay | Admitting: Family Medicine

## 2012-10-01 NOTE — Telephone Encounter (Signed)
Caregiver is calling and needs a copy of Ensure order faxed to [phone].  He is needing 1 can every other day.

## 2012-10-05 NOTE — Telephone Encounter (Signed)
Will have someone fill prescription for Ensure and have it faxed as I am not able to make it to the clinic.

## 2012-10-09 ENCOUNTER — Telehealth: Payer: Self-pay | Admitting: Family Medicine

## 2012-10-09 MED ORDER — ENSURE PO LIQD: ORAL | Status: DC

## 2012-10-09 NOTE — Telephone Encounter (Signed)
Rene Kocher from [facility] about [name] [name]. She had some questions about his oral supplements and she would like someone to call her at [phone]. JW

## 2012-11-26 ENCOUNTER — Encounter: Payer: Self-pay | Admitting: Family Medicine

## 2012-11-26 ENCOUNTER — Ambulatory Visit (INDEPENDENT_AMBULATORY_CARE_PROVIDER_SITE_OTHER): Payer: Medicare Other | Admitting: Family Medicine

## 2012-11-26 VITALS — BP 131/76 | HR 82 | Temp 97.6°F | Ht 68.0 in | Wt 143.5 lb

## 2012-11-26 DIAGNOSIS — Z23 Encounter for immunization: Secondary | ICD-10-CM

## 2012-11-26 DIAGNOSIS — Z0000 Encounter for general adult medical examination without abnormal findings: Secondary | ICD-10-CM

## 2012-11-26 DIAGNOSIS — E785 Hyperlipidemia, unspecified: Secondary | ICD-10-CM

## 2012-11-26 LAB — CBC
HCT: 33.7 % — ABNORMAL LOW (ref 39.0–52.0)
Hemoglobin: 11.6 g/dL — ABNORMAL LOW (ref 13.0–17.0)
MCH: 30.1 pg (ref 26.0–34.0)
MCHC: 34.4 g/dL (ref 30.0–36.0)
RBC: 3.85 MIL/uL — ABNORMAL LOW (ref 4.22–5.81)

## 2012-11-26 LAB — LIPID PANEL
Cholesterol: 124 mg/dL (ref 0–200)
HDL: 33 mg/dL — ABNORMAL LOW (ref >39)
LDL Cholesterol: 73 mg/dL (ref 0–99)
Triglycerides: 89 mg/dL (ref <150)
VLDL: 18 mg/dL (ref 0–40)

## 2012-11-26 LAB — COMPREHENSIVE METABOLIC PANEL
Albumin: 3.3 g/dL — ABNORMAL LOW (ref 3.5–5.2)
Alkaline Phosphatase: 88 U/L (ref 39–117)
BUN: 15 mg/dL (ref 6–23)
CO2: 27 mEq/L (ref 19–32)
Glucose, Bld: 149 mg/dL — ABNORMAL HIGH (ref 70–99)
Sodium: 135 mEq/L (ref 135–145)
Total Bilirubin: 0.4 mg/dL (ref 0.3–1.2)
Total Protein: 7 g/dL (ref 6.0–8.3)

## 2012-11-26 MED ORDER — HYPROMELLOSE (GONIOSCOPIC) 2.5 % OP SOLN: 1.0000 [drp] | Freq: Three times a day (TID) | OPHTHALMIC | Status: DC | PRN

## 2012-11-26 NOTE — Patient Instructions (Addendum)
Today you wanted to meet me as a new doctor. We discussed some health maintenance issues mainly regarding flu vaccine and nutrition. Regarding your blood pressure, it is currently controlled and no changes will be made. We got blood for a CBC, metabolic panel, lipid panel and TSH today. Regarding your ear wax, consider applying mineral oil to each ear, and leaving it in for a little bit before draining onto a napkin. Please see me again in one year.

## 2012-11-26 NOTE — Progress Notes (Signed)
  Subjective:    Patient ID: [name] [name], male    DOB: 12-20-1934, 78 y.o.   MRN: [id number]  HPI Comments: Patient has been rubbing his eye and it has become red. Patient has a history and used to have eye drops but has run out.      Review of Systems  Eyes: Positive for redness.  Respiratory: Positive for cough. Negative for shortness of breath.   Endocrine: Negative.   Genitourinary: Negative.        Objective:   Physical Exam  Vitals reviewed. Constitutional: He appears well-developed and well-nourished. No distress.  HENT:  Head: Normocephalic.  Nose: Nose normal.  Mouth/Throat: Mucous membranes are normal. Abnormal dentition.  Cerumen blocking ear canals bilaterally  Neck: Normal range of motion.  Cardiovascular: Normal rate, regular rhythm, normal heart sounds and intact distal pulses.   No murmur heard. Pulmonary/Chest: Effort normal. No respiratory distress. He has no wheezes. He has rales in the right middle field, the right lower field, the left middle field and the left lower field. He exhibits no tenderness.  Abdominal: Soft. Bowel sounds are normal. He exhibits no distension. There is no tenderness.  Musculoskeletal: Normal range of motion. He exhibits no edema and no tenderness.  Neurological: He is alert. He displays normal reflexes. He exhibits normal muscle tone.  Skin: Skin is warm and dry. He is not diaphoretic.          Assessment & Plan:

## 2012-11-27 ENCOUNTER — Encounter: Payer: Self-pay | Admitting: Family Medicine

## 2012-11-27 ENCOUNTER — Telehealth: Payer: Self-pay | Admitting: Family Medicine

## 2012-11-27 DIAGNOSIS — Z0000 Encounter for general adult medical examination without abnormal findings: Secondary | ICD-10-CM | POA: Insufficient documentation
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
## 2012-11-27 NOTE — Assessment & Plan Note (Signed)
Weight has been decreasing. Instructed caregiver to be mindful of patient's nutrition and to prevent continued weight decline. Will follow-up CBC, Cmet and lipid panel

## 2012-11-27 NOTE — Patient Instructions (Signed)
Got a message from front office, patient is calling for his PCP to change his eye ointment prescribed yesterday, PCP is not in the clinic currently. I reviewed her note from yesterday which is incomplete,no diagnosis to work on hence I do not know the indication for the ointment. I suggested if it is not urgent to send in box message to his PCP for refill but if symptom is worsening to come in to be evaluated or go to the ED. It is unclear to me why patient could not use the prescribed prescription. Plan to try to reach the PCP,but if symptom is worsen to get back to me or the clinic as soon as possible.

## 2012-12-01 NOTE — Telephone Encounter (Signed)
Please advise. [name] [name]  

## 2012-12-01 NOTE — Telephone Encounter (Signed)
[name] [name] called again from the pharmacy concerning the eye medication. She said it is too expensive for him and they don't even carry that particular one. She would like to change it to something else. jw

## 2012-12-03 NOTE — Telephone Encounter (Signed)
Called in prescription to the pharmacy.

## 2013-03-02 ENCOUNTER — Telehealth: Payer: Self-pay | Admitting: *Deleted

## 2013-03-02 NOTE — Telephone Encounter (Signed)
Prior Authorization received from [facility] for Carafate 1 mg. Formulary and PA form placed in provider box for completion. Clovis PuMartin, Chayse Gracey L, RN

## 2013-03-05 NOTE — Telephone Encounter (Signed)
Relayed message,carol from [facility] voiced understanding. Aamori Mcmasters, Virgel BouquetGiovanna S

## 2013-04-06 ENCOUNTER — Emergency Department (HOSPITAL_COMMUNITY)
Admission: EM | Admit: 2013-04-06 | Discharge: 2013-04-06 | Disposition: A | Discharge status: Home / Self Care | Payer: Medicare Other | Source: Home / Self Care | Attending: Emergency Medicine | Admitting: Emergency Medicine

## 2013-04-06 ENCOUNTER — Encounter (HOSPITAL_COMMUNITY): Payer: Self-pay | Admitting: Emergency Medicine

## 2013-04-06 DIAGNOSIS — J4489 Other specified chronic obstructive pulmonary disease: Secondary | ICD-10-CM | POA: Insufficient documentation

## 2013-04-06 DIAGNOSIS — D649 Anemia, unspecified: Secondary | ICD-10-CM

## 2013-04-06 DIAGNOSIS — I10 Essential (primary) hypertension: Secondary | ICD-10-CM | POA: Insufficient documentation

## 2013-04-06 DIAGNOSIS — F79 Unspecified intellectual disabilities: Secondary | ICD-10-CM | POA: Insufficient documentation

## 2013-04-06 DIAGNOSIS — IMO0002 Reserved for concepts with insufficient information to code with codable children: Secondary | ICD-10-CM

## 2013-04-06 DIAGNOSIS — R197 Diarrhea, unspecified: Secondary | ICD-10-CM

## 2013-04-06 DIAGNOSIS — Z87891 Personal history of nicotine dependence: Secondary | ICD-10-CM

## 2013-04-06 DIAGNOSIS — Z79899 Other long term (current) drug therapy: Secondary | ICD-10-CM

## 2013-04-06 DIAGNOSIS — K219 Gastro-esophageal reflux disease without esophagitis: Secondary | ICD-10-CM

## 2013-04-06 DIAGNOSIS — Z880 Allergy status to penicillin: Secondary | ICD-10-CM | POA: Insufficient documentation

## 2013-04-06 DIAGNOSIS — F0390 Unspecified dementia without behavioral disturbance: Secondary | ICD-10-CM

## 2013-04-06 DIAGNOSIS — J449 Chronic obstructive pulmonary disease, unspecified: Secondary | ICD-10-CM | POA: Insufficient documentation

## 2013-04-06 LAB — CBC WITH DIFFERENTIAL/PLATELET
BASOS ABS: 0 10*3/uL (ref 0.0–0.1)
Basophils Relative: 0 % (ref 0–1)
Eosinophils Absolute: 0.1 10*3/uL (ref 0.0–0.7)
Eosinophils Relative: 1 % (ref 0–5)
HEMATOCRIT: 38.4 % — AB (ref 39.0–52.0)
Hemoglobin: 12.8 g/dL — ABNORMAL LOW (ref 13.0–17.0)
LYMPHS PCT: 7 % — AB (ref 12–46)
Lymphs Abs: 0.9 10*3/uL (ref 0.7–4.0)
MCH: 30.3 pg (ref 26.0–34.0)
MCHC: 33.3 g/dL (ref 30.0–36.0)
MCV: 90.8 fL (ref 78.0–100.0)
Monocytes Absolute: 1.3 10*3/uL — ABNORMAL HIGH (ref 0.1–1.0)
Monocytes Relative: 10 % (ref 3–12)
NEUTROS ABS: 11.1 10*3/uL — AB (ref 1.7–7.7)
NEUTROS PCT: 83 % — AB (ref 43–77)
PLATELETS: 304 10*3/uL (ref 150–400)
RBC: 4.23 MIL/uL (ref 4.22–5.81)
RDW: 16 % — AB (ref 11.5–15.5)
WBC: 13.4 10*3/uL — AB (ref 4.0–10.5)

## 2013-04-06 LAB — COMPREHENSIVE METABOLIC PANEL
ALT: 20 U/L (ref 0–53)
AST: 37 U/L (ref 0–37)
Albumin: 3.5 g/dL (ref 3.5–5.2)
Alkaline Phosphatase: 92 U/L (ref 39–117)
BILIRUBIN TOTAL: 0.3 mg/dL (ref 0.3–1.2)
BUN: 24 mg/dL — ABNORMAL HIGH (ref 6–23)
CHLORIDE: 99 meq/L (ref 96–112)
CO2: 25 meq/L (ref 19–32)
Calcium: 8.7 mg/dL (ref 8.4–10.5)
Creatinine, Ser: 1.06 mg/dL (ref 0.50–1.35)
GFR calc Af Amer: 77 mL/min — ABNORMAL LOW (ref >90)
GFR, EST NON AFRICAN AMERICAN: 66 mL/min — AB (ref >90)
Glucose, Bld: 85 mg/dL (ref 70–99)
POTASSIUM: 3.7 meq/L (ref 3.7–5.3)
SODIUM: 137 meq/L (ref 137–147)
Total Protein: 7.7 g/dL (ref 6.0–8.3)

## 2013-04-06 LAB — URINALYSIS, ROUTINE W REFLEX MICROSCOPIC
BILIRUBIN URINE: NEGATIVE
GLUCOSE, UA: NEGATIVE mg/dL
HGB URINE DIPSTICK: NEGATIVE
Ketones, ur: NEGATIVE mg/dL
Leukocytes, UA: NEGATIVE
Nitrite: NEGATIVE
PROTEIN: NEGATIVE mg/dL
Specific Gravity, Urine: 1.019 (ref 1.005–1.030)
UROBILINOGEN UA: 0.2 mg/dL (ref 0.0–1.0)
pH: 5.5 (ref 5.0–8.0)

## 2013-04-06 MED ORDER — SODIUM CHLORIDE 0.9 % IV BOLUS (SEPSIS)
500.0000 mL | Freq: Once | INTRAVENOUS | Status: AC
  Administered 2013-04-06: 500 mL via INTRAVENOUS

## 2013-04-06 MED ORDER — LOPERAMIDE HCL 2 MG PO CAPS
2.0000 mg | ORAL_CAPSULE | ORAL | Status: DC | PRN
  Administered 2013-04-06: 2 mg via ORAL
  Filled 2013-04-06: qty 1

## 2013-04-06 MED ORDER — DIPHENOXYLATE-ATROPINE 2.5-0.025 MG PO TABS: 2.0000 | ORAL_TABLET | Freq: Four times a day (QID) | ORAL | Status: DC | PRN

## 2013-04-06 MED ORDER — SODIUM CHLORIDE 0.9 % IV SOLN
INTRAVENOUS | Status: DC
  Administered 2013-04-06: 17:00:00 via INTRAVENOUS

## 2013-04-06 NOTE — ED Notes (Addendum)
Unable to get blood due to slow flowing and thick and clotting.  Will re-attempt blood draw after bolus of NS. Made [name] [name] EDP aware.

## 2013-04-06 NOTE — Discharge Instructions (Signed)
Diarrhea Diarrhea is frequent loose and watery bowel movements. It can cause you to feel weak and dehydrated. Dehydration can cause you to become tired and thirsty, have a dry mouth, and have decreased urination that often is dark yellow. Diarrhea is a sign of another problem, most often an infection that will not last long. In most cases, diarrhea typically lasts 2 3 days. However, it can last longer if it is a sign of something more serious. It is important to treat your diarrhea as directed by your caregive to lessen or prevent future episodes of diarrhea. CAUSES  Some common causes include:  Gastrointestinal infections caused by viruses, bacteria, or parasites.  Food poisoning or food allergies.  Certain medicines, such as antibiotics, chemotherapy, and laxatives.  Artificial sweeteners and fructose.  Digestive disorders. HOME CARE INSTRUCTIONS  Ensure adequate fluid intake (hydration): have 1 cup (8 oz) of fluid for each diarrhea episode. Avoid fluids that contain simple sugars or sports drinks, fruit juices, whole milk products, and sodas. Your urine should be clear or pale yellow if you are drinking enough fluids. Hydrate with an oral rehydration solution that you can purchase at pharmacies, retail stores, and online. You can prepare an oral rehydration solution at home by mixing the following ingredients together:    tsp table salt.   tsp baking soda.   tsp salt substitute containing potassium chloride.  1  tablespoons sugar.  1 L (34 oz) of water.  Certain foods and beverages may increase the speed at which food moves through the gastrointestinal (GI) tract. These foods and beverages should be avoided and include:  Caffeinated and alcoholic beverages.  High-fiber foods, such as raw fruits and vegetables, nuts, seeds, and whole grain breads and cereals.  Foods and beverages sweetened with sugar alcohols, such as xylitol, sorbitol, and mannitol.  Some foods may be well  tolerated and may help thicken stool including:  Starchy foods, such as rice, toast, pasta, low-sugar cereal, oatmeal, grits, baked potatoes, crackers, and bagels.  Bananas.  Applesauce.  Add probiotic-rich foods to help increase healthy bacteria in the GI tract, such as yogurt and fermented milk products.  Wash your hands well after each diarrhea episode.  Only take over-the-counter or prescription medicines as directed by your caregiver.  Take a warm bath to relieve any burning or pain from frequent diarrhea episodes. SEEK IMMEDIATE MEDICAL CARE IF:   You are unable to keep fluids down.  You have persistent vomiting.  You have blood in your stool, or your stools are black and tarry.  You do not urinate in 6 8 hours, or there is only a small amount of very dark urine.  You have abdominal pain that increases or localizes.  You have weakness, dizziness, confusion, or lightheadedness.  You have a severe headache.  Your diarrhea gets worse or does not get better.  You have a fever or persistent symptoms for more than 2 3 days.  You have a fever and your symptoms suddenly get worse. MAKE SURE YOU:   Understand these instructions.  Will watch your condition.  Will get help right away if you are not doing well or get worse. Document Released: 12/28/2001 Document Revised: 12/25/2011 Document Reviewed: 09/15/2011 [employer] Patient Information 2014 [employer].  

## 2013-04-06 NOTE — ED Notes (Signed)
Diarrhea x 2 days; no fever; sluggish per family; house mate had diarrhea and admitted last wk

## 2013-04-06 NOTE — ED Notes (Signed)
Unable to get blood. Patient stuck x1 by this Clinical research associatewriter, unsuccessful. RN  Lyla Sonarrie notified

## 2013-04-06 NOTE — ED Provider Notes (Signed)
Pt received at change of shift with labs and UA pending. 79 yo M, c/o 2 episodes of "brown" "watery" diarrhea today, no fevers, no N/V, no abd pain, no CP/SOB. Pt lives in a group home, hx MR. Feels "fine" now. VSS/afebrile, awake/alert, resps easy, abd soft/NT, neuro non-focal. Pt has tol PO well while in the ED. Pt has stooled x1 while in the ED; GI pathogen panel pending. Pt and group home staff want to take him home now. Dx and testing d/w pt and group home staff.  Questions answered.  Verb understanding, agreeable to d/c home with outpt f/u.    Results for orders placed during the hospital encounter of 04/06/13  CBC WITH DIFFERENTIAL      Result Value Ref Range   WBC 13.4 (*) 4.0 - 10.5 K/uL   RBC 4.23  4.22 - 5.81 MIL/uL   Hemoglobin 12.8 (*) 13.0 - 17.0 g/dL   HCT 81.138.4 (*) 91.439.0 - 78.252.0 %   MCV 90.8  78.0 - 100.0 fL   MCH 30.3  26.0 - 34.0 pg   MCHC 33.3  30.0 - 36.0 g/dL   RDW 95.616.0 (*) 21.311.5 - 08.615.5 %   Platelets 304  150 - 400 K/uL   Neutrophils Relative % 83 (*) 43 - 77 %   Neutro Abs 11.1 (*) 1.7 - 7.7 K/uL   Lymphocytes Relative 7 (*) 12 - 46 %   Lymphs Abs 0.9  0.7 - 4.0 K/uL   Monocytes Relative 10  3 - 12 %   Monocytes Absolute 1.3 (*) 0.1 - 1.0 K/uL   Eosinophils Relative 1  0 - 5 %   Eosinophils Absolute 0.1  0.0 - 0.7 K/uL   Basophils Relative 0  0 - 1 %   Basophils Absolute 0.0  0.0 - 0.1 K/uL  COMPREHENSIVE METABOLIC PANEL      Result Value Ref Range   Sodium 137  137 - 147 mEq/L   Potassium 3.7  3.7 - 5.3 mEq/L   Chloride 99  96 - 112 mEq/L   CO2 25  19 - 32 mEq/L   Glucose, Bld 85  70 - 99 mg/dL   BUN 24 (*) 6 - 23 mg/dL   Creatinine, Ser 5.781.06  0.50 - 1.35 mg/dL   Calcium 8.7  8.4 - 46.910.5 mg/dL   Total Protein 7.7  6.0 - 8.3 g/dL   Albumin 3.5  3.5 - 5.2 g/dL   AST 37  0 - 37 U/L   ALT 20  0 - 53 U/L   Alkaline Phosphatase 92  39 - 117 U/L   Total Bilirubin 0.3  0.3 - 1.2 mg/dL   GFR calc non Af Amer 66 (*) >90 mL/min   GFR calc Af Amer 77 (*) >90 mL/min   URINALYSIS, ROUTINE W REFLEX MICROSCOPIC      Result Value Ref Range   Color, Urine YELLOW  YELLOW   APPearance CLEAR  CLEAR   Specific Gravity, Urine 1.019  1.005 - 1.030   pH 5.5  5.0 - 8.0   Glucose, UA NEGATIVE  NEGATIVE mg/dL   Hgb urine dipstick NEGATIVE  NEGATIVE   Bilirubin Urine NEGATIVE  NEGATIVE   Ketones, ur NEGATIVE  NEGATIVE mg/dL   Protein, ur NEGATIVE  NEGATIVE mg/dL   Urobilinogen, UA 0.2  0.0 - 1.0 mg/dL   Nitrite NEGATIVE  NEGATIVE   Leukocytes, UA NEGATIVE  NEGATIVE      Laray AngerKathleen M Tangala Wiegert, DO 04/06/13 2040

## 2013-04-06 NOTE — ED Notes (Signed)
Pt was given ice water--- able to drink 1 cup, tolerated well; pt denies nausea.

## 2013-04-06 NOTE — ED Provider Notes (Signed)
CSN: [id number]     Arrival date & time 04/06/13  1117 History   First MD Initiated Contact with Patient 04/06/13 1132     Chief Complaint  Patient presents with  . Diarrhea     (Consider location/radiation/quality/duration/timing/severity/associated sxs/prior Treatment) Patient is a 79 y.o. male presenting with diarrhea. The history is provided by a caregiver.  Diarrhea  patient here 24 hours watery stools. No reported fever or chills. No vomiting. Patient has a history of MR as well as dementia and lives in a group home. According to caregivers, the patient has been exposed to another resident who tested positive for C. difficile. States that his dose as is that his baseline. No treatment given to the patient prior to arrival. Nothing makes the symptoms better or worse. He noted the patient has had 2 large watery stools. No blood was noted. The stool was brown  Past Medical History  Diagnosis Date  . COPD (chronic obstructive pulmonary disease)   . History of stomach ulcers   . Retardation   . Anemia   . Hypertension   . GERD (gastroesophageal reflux disease)   . Dementia    Past Surgical History  Procedure Laterality Date  . Gastrectomy     No family history on file. History  Substance Use Topics  . Smoking status: Former Games developer  . Smokeless tobacco: Not on file  . Alcohol Use: No    Review of Systems  Unable to perform ROS Gastrointestinal: Positive for diarrhea.      Allergies  Penicillins  Home Medications   Current Outpatient Rx  Name  Route  Sig  Dispense  Refill  . albuterol (PROAIR HFA) 108 (90 BASE) MCG/ACT inhaler   Inhalation   Inhale 2 puffs into the lungs every 4 (four) hours as needed. For  SOB/wheezing. Use with spacer          . ARTIFICIAL TEARS ophthalmic solution   Left Eye   Place 2 drops into the left eye 3 (three) times daily. 2 drops in left eye 3 times a day until eye condition resolves. Dips QS 1 month          . EXPIRED:  cetirizine (ZYRTEC) 10 MG chewable tablet   Oral   Chew 1 tablet (10 mg total) by mouth daily.   30 tablet   11   . Cholecalciferol 5000 UNITS capsule   Oral   Take 5,000 Units by mouth daily.         Marland Kitchen docusate sodium (COLACE) 100 MG capsule   Oral   Take 100 mg by mouth 2 (two) times daily. Scheduled           . ENSURE (ENSURE)      Drink by mouth every other day   946 mL   11   . esomeprazole (NEXIUM) 20 MG capsule   Oral   Take 40 mg by mouth 2 (two) times daily. for esophageal ulcers/barretts esophagus         . ferrous sulfate 325 (65 FE) MG tablet   Oral   Take 1 tablet (325 mg total) by mouth daily with breakfast. Take 1 tab daily   90 tablet   11   . fluticasone (FLOVENT HFA) 220 MCG/ACT inhaler   Inhalation   Inhale 1 puff into the lungs 2 (two) times daily.   1 Inhaler   11   . hydroxypropyl methylcellulose (ISOPTO TEARS) 2.5 % ophthalmic solution   Both Eyes  Place 1 drop into both eyes 3 (three) times daily as needed for dry eyes.   15 mL   0   . Ipratropium-Albuterol (COMBIVENT) 20-100 MCG/ACT AERS respimat   Inhalation   Inhale 1 puff into the lungs every 6 (six) hours as needed for wheezing.   4 g   6     Start this when patient finishes current supply of ...   . lurasidone (LATUDA) 80 MG TABS   Oral   Take 160 mg by mouth daily with breakfast.         . memantine (NAMENDA) 10 MG tablet   Oral   Take 10 mg by mouth 2 (two) times daily. Prescriber: Hazle Cocaavid Fulford          . Olopatadine HCl 0.2 % SOLN   Ophthalmic   Apply 1 drop to eye 2 (two) times daily.           . potassium chloride (KLOR-CON) 20 MEQ packet   Oral   Take 20 mEq by mouth daily. 1 tab by mouth daily          . simvastatin (ZOCOR) 20 MG tablet   Oral   Take 20 mg by mouth at bedtime.           . Skin Protectants, Misc. (EUCERIN) cream   Topical   Apply topically 2 (two) times daily. Apply liberally to skin twice a day. Disp 1 large jar           . sucralfate (CARAFATE) 1 GM/10ML suspension   Oral   Take 1 g by mouth 4 (four) times daily. 2 teaspoons (1 gm)Disp QS 1 month          . thioridazine (MELLARIL) 50 MG tablet   Oral   Take 50 mg by mouth every morning. One tablet in AM, 2 at bedtime.  Prescriber: Hazle Cocaavid Fulford          . triamterene-hydrochlorothiazide (DYAZIDE) 37.5-25 MG per capsule   Oral   Take 1 capsule by mouth daily.           Marland Kitchen. zoster vaccine live, PF, (ZOSTAVAX) [id number] UNT/0.65ML injection   Subcutaneous   Inject 19,400 Units into the skin once.   1 vial   0    BP 122/76  Pulse 80  Temp(Src) 98.8 F (37.1 C)  Resp 20  SpO2 98% Physical Exam  Nursing note and vitals reviewed. Constitutional: He appears well-developed and well-nourished.  Non-toxic appearance. No distress.  HENT:  Head: Normocephalic and atraumatic.  Eyes: Conjunctivae, EOM and lids are normal. Pupils are equal, round, and reactive to light.  Neck: Normal range of motion. Neck supple. No tracheal deviation present. No mass present.  Cardiovascular: Normal rate, regular rhythm and normal heart sounds.  Exam reveals no gallop.   No murmur heard. Pulmonary/Chest: Effort normal and breath sounds normal. No stridor. No respiratory distress. He has no decreased breath sounds. He has no wheezes. He has no rhonchi. He has no rales.  Abdominal: Soft. Normal appearance and bowel sounds are normal. He exhibits no distension. There is no tenderness. There is no rebound and no CVA tenderness.  Musculoskeletal: Normal range of motion. He exhibits no edema and no tenderness.  Neurological: He is alert. No cranial nerve deficit. GCS eye subscore is 4. GCS verbal subscore is 4. GCS motor subscore is 6.  Skin: Skin is warm and dry. No abrasion and no rash noted.  Psychiatric: His speech is normal. His affect  is blunt.    ED Course  Procedures (including critical care time) Labs Review Labs Reviewed  STOOL CULTURE  CBC WITH  DIFFERENTIAL  COMPREHENSIVE METABOLIC PANEL  URINALYSIS, ROUTINE W REFLEX MICROSCOPIC  GI PATHOGEN PANEL BY PCR, STOOL   Imaging Review No results found.   EKG Interpretation None      MDM   Final diagnoses:  None   Pt non toxic appearing and at his baseline No signs of dehydration Pt given iv fluids and meds, labs pending, signed out to dr. Ferman Hamming    Toy Baker, MD 04/06/13 [phone]

## 2013-04-07 ENCOUNTER — Inpatient Hospital Stay (HOSPITAL_COMMUNITY)
Admission: EM | Admit: 2013-04-07 | Discharge: 2013-04-19 | DRG: 871 | Disposition: A | Discharge status: Skilled Nursing Facility | Payer: Medicare Other | Attending: Family Medicine | Admitting: Family Medicine

## 2013-04-07 ENCOUNTER — Inpatient Hospital Stay (HOSPITAL_COMMUNITY): Payer: Medicare Other

## 2013-04-07 ENCOUNTER — Telehealth: Payer: Self-pay | Admitting: Family Medicine

## 2013-04-07 ENCOUNTER — Encounter (HOSPITAL_COMMUNITY): Payer: Self-pay | Admitting: Emergency Medicine

## 2013-04-07 ENCOUNTER — Emergency Department (HOSPITAL_COMMUNITY): Payer: Medicare Other

## 2013-04-07 DIAGNOSIS — J4489 Other specified chronic obstructive pulmonary disease: Secondary | ICD-10-CM | POA: Diagnosis present

## 2013-04-07 DIAGNOSIS — J9600 Acute respiratory failure, unspecified whether with hypoxia or hypercapnia: Secondary | ICD-10-CM

## 2013-04-07 DIAGNOSIS — Z66 Do not resuscitate: Secondary | ICD-10-CM | POA: Diagnosis not present

## 2013-04-07 DIAGNOSIS — Z79899 Other long term (current) drug therapy: Secondary | ICD-10-CM

## 2013-04-07 DIAGNOSIS — J690 Pneumonitis due to inhalation of food and vomit: Secondary | ICD-10-CM | POA: Diagnosis present

## 2013-04-07 DIAGNOSIS — A419 Sepsis, unspecified organism: Secondary | ICD-10-CM | POA: Diagnosis not present

## 2013-04-07 DIAGNOSIS — I4892 Unspecified atrial flutter: Secondary | ICD-10-CM | POA: Diagnosis not present

## 2013-04-07 DIAGNOSIS — F0390 Unspecified dementia without behavioral disturbance: Secondary | ICD-10-CM | POA: Diagnosis present

## 2013-04-07 DIAGNOSIS — E872 Acidosis, unspecified: Secondary | ICD-10-CM

## 2013-04-07 DIAGNOSIS — R1312 Dysphagia, oropharyngeal phase: Secondary | ICD-10-CM | POA: Diagnosis present

## 2013-04-07 DIAGNOSIS — IMO0002 Reserved for concepts with insufficient information to code with codable children: Secondary | ICD-10-CM

## 2013-04-07 DIAGNOSIS — Z8711 Personal history of peptic ulcer disease: Secondary | ICD-10-CM

## 2013-04-07 DIAGNOSIS — J9620 Acute and chronic respiratory failure, unspecified whether with hypoxia or hypercapnia: Secondary | ICD-10-CM | POA: Diagnosis present

## 2013-04-07 DIAGNOSIS — R000 Tachycardia, unspecified: Secondary | ICD-10-CM

## 2013-04-07 DIAGNOSIS — E874 Mixed disorder of acid-base balance: Secondary | ICD-10-CM | POA: Diagnosis present

## 2013-04-07 DIAGNOSIS — F29 Unspecified psychosis not due to a substance or known physiological condition: Secondary | ICD-10-CM

## 2013-04-07 DIAGNOSIS — D509 Iron deficiency anemia, unspecified: Secondary | ICD-10-CM | POA: Diagnosis present

## 2013-04-07 DIAGNOSIS — K2270 Barrett's esophagus without dysplasia: Secondary | ICD-10-CM | POA: Diagnosis present

## 2013-04-07 DIAGNOSIS — K219 Gastro-esophageal reflux disease without esophagitis: Secondary | ICD-10-CM | POA: Diagnosis not present

## 2013-04-07 DIAGNOSIS — E876 Hypokalemia: Secondary | ICD-10-CM

## 2013-04-07 DIAGNOSIS — F0391 Unspecified dementia with behavioral disturbance: Secondary | ICD-10-CM

## 2013-04-07 DIAGNOSIS — R7989 Other specified abnormal findings of blood chemistry: Secondary | ICD-10-CM

## 2013-04-07 DIAGNOSIS — I472 Ventricular tachycardia, unspecified: Secondary | ICD-10-CM | POA: Diagnosis not present

## 2013-04-07 DIAGNOSIS — Z87891 Personal history of nicotine dependence: Secondary | ICD-10-CM

## 2013-04-07 DIAGNOSIS — I4729 Other ventricular tachycardia: Secondary | ICD-10-CM | POA: Diagnosis not present

## 2013-04-07 DIAGNOSIS — Z880 Allergy status to penicillin: Secondary | ICD-10-CM

## 2013-04-07 DIAGNOSIS — I4891 Unspecified atrial fibrillation: Secondary | ICD-10-CM | POA: Diagnosis not present

## 2013-04-07 DIAGNOSIS — F39 Unspecified mood [affective] disorder: Secondary | ICD-10-CM | POA: Diagnosis present

## 2013-04-07 DIAGNOSIS — E440 Moderate protein-calorie malnutrition: Secondary | ICD-10-CM | POA: Diagnosis present

## 2013-04-07 DIAGNOSIS — A088 Other specified intestinal infections: Secondary | ICD-10-CM | POA: Diagnosis present

## 2013-04-07 DIAGNOSIS — I495 Sick sinus syndrome: Secondary | ICD-10-CM | POA: Diagnosis present

## 2013-04-07 DIAGNOSIS — F03918 Unspecified dementia, unspecified severity, with other behavioral disturbance: Secondary | ICD-10-CM

## 2013-04-07 DIAGNOSIS — J45909 Unspecified asthma, uncomplicated: Secondary | ICD-10-CM

## 2013-04-07 DIAGNOSIS — L259 Unspecified contact dermatitis, unspecified cause: Secondary | ICD-10-CM | POA: Diagnosis present

## 2013-04-07 DIAGNOSIS — E785 Hyperlipidemia, unspecified: Secondary | ICD-10-CM | POA: Diagnosis present

## 2013-04-07 DIAGNOSIS — F79 Unspecified intellectual disabilities: Secondary | ICD-10-CM

## 2013-04-07 DIAGNOSIS — R6520 Severe sepsis without septic shock: Secondary | ICD-10-CM

## 2013-04-07 DIAGNOSIS — J449 Chronic obstructive pulmonary disease, unspecified: Secondary | ICD-10-CM | POA: Diagnosis present

## 2013-04-07 DIAGNOSIS — Z515 Encounter for palliative care: Secondary | ICD-10-CM

## 2013-04-07 DIAGNOSIS — I509 Heart failure, unspecified: Secondary | ICD-10-CM | POA: Diagnosis present

## 2013-04-07 DIAGNOSIS — G510 Bell's palsy: Secondary | ICD-10-CM

## 2013-04-07 DIAGNOSIS — I959 Hypotension, unspecified: Secondary | ICD-10-CM

## 2013-04-07 DIAGNOSIS — I4721 Torsades de pointes: Secondary | ICD-10-CM

## 2013-04-07 DIAGNOSIS — I10 Essential (primary) hypertension: Secondary | ICD-10-CM

## 2013-04-07 LAB — I-STAT ARTERIAL BLOOD GAS, ED
Bicarbonate: 25.6 mEq/L — ABNORMAL HIGH (ref 20.0–24.0)
O2 Saturation: 100 %
PCO2 ART: 50 mmHg — AB (ref 35.0–45.0)
Patient temperature: 102.7
TCO2: 27 mmol/L (ref 0–100)
pH, Arterial: 7.327 — ABNORMAL LOW (ref 7.350–7.450)
pO2, Arterial: 238 mmHg — ABNORMAL HIGH (ref 80.0–100.0)

## 2013-04-07 LAB — BASIC METABOLIC PANEL
BUN: 27 mg/dL — ABNORMAL HIGH (ref 6–23)
BUN: 24 mg/dL — ABNORMAL HIGH (ref 6–23)
BUN: 23 mg/dL (ref 6–23)
CALCIUM: 7.8 mg/dL — AB (ref 8.4–10.5)
CALCIUM: 7.8 mg/dL — AB (ref 8.4–10.5)
CHLORIDE: 95 meq/L — AB (ref 96–112)
CHLORIDE: 100 meq/L (ref 96–112)
CO2: 26 mEq/L (ref 19–32)
CO2: 23 mEq/L (ref 19–32)
CO2: 20 meq/L (ref 19–32)
CREATININE: 0.94 mg/dL (ref 0.50–1.35)
Calcium: 8.4 mg/dL (ref 8.4–10.5)
Chloride: 98 mEq/L (ref 96–112)
Creatinine, Ser: 1.17 mg/dL (ref 0.50–1.35)
Creatinine, Ser: 1.03 mg/dL (ref 0.50–1.35)
GFR calc Af Amer: 68 mL/min — ABNORMAL LOW (ref >90)
GFR calc Af Amer: >90 mL/min (ref >90)
GFR calc non Af Amer: 59 mL/min — ABNORMAL LOW (ref >90)
GFR calc non Af Amer: 79 mL/min — ABNORMAL LOW (ref >90)
GFR, EST AFRICAN AMERICAN: 79 mL/min — AB (ref >90)
GFR, EST NON AFRICAN AMERICAN: 68 mL/min — AB (ref >90)
GLUCOSE: 147 mg/dL — AB (ref 70–99)
Glucose, Bld: 92 mg/dL (ref 70–99)
Glucose, Bld: 74 mg/dL (ref 70–99)
POTASSIUM: 3.5 meq/L — AB (ref 3.7–5.3)
POTASSIUM: 3.2 meq/L — AB (ref 3.7–5.3)
Potassium: 3.3 mEq/L — ABNORMAL LOW (ref 3.7–5.3)
Sodium: 137 mEq/L (ref 137–147)
Sodium: 137 mEq/L (ref 137–147)
Sodium: 138 mEq/L (ref 137–147)

## 2013-04-07 LAB — CBC WITH DIFFERENTIAL/PLATELET
Basophils Absolute: 0 10*3/uL (ref 0.0–0.1)
Basophils Relative: 0 % (ref 0–1)
EOS PCT: 0 % (ref 0–5)
Eosinophils Absolute: 0 10*3/uL (ref 0.0–0.7)
HCT: 42.9 % (ref 39.0–52.0)
Hemoglobin: 14.5 g/dL (ref 13.0–17.0)
LYMPHS ABS: 0.8 10*3/uL (ref 0.7–4.0)
Lymphocytes Relative: 6 % — ABNORMAL LOW (ref 12–46)
MCH: 30.9 pg (ref 26.0–34.0)
MCHC: 33.8 g/dL (ref 30.0–36.0)
MCV: 91.5 fL (ref 78.0–100.0)
Monocytes Absolute: 0.8 10*3/uL (ref 0.1–1.0)
Monocytes Relative: 6 % (ref 3–12)
NEUTROS PCT: 88 % — AB (ref 43–77)
Neutro Abs: 11.8 10*3/uL — ABNORMAL HIGH (ref 1.7–7.7)
Platelets: 308 10*3/uL (ref 150–400)
RBC: 4.69 MIL/uL (ref 4.22–5.81)
RDW: 16 % — ABNORMAL HIGH (ref 11.5–15.5)
WBC: 13.4 10*3/uL — ABNORMAL HIGH (ref 4.0–10.5)

## 2013-04-07 LAB — I-STAT CG4 LACTIC ACID, ED: Lactic Acid, Venous: 2.75 mmol/L — ABNORMAL HIGH (ref 0.5–2.2)

## 2013-04-07 LAB — URINALYSIS, ROUTINE W REFLEX MICROSCOPIC
Bilirubin Urine: NEGATIVE
Glucose, UA: NEGATIVE mg/dL
Hgb urine dipstick: NEGATIVE
Ketones, ur: NEGATIVE mg/dL
LEUKOCYTES UA: NEGATIVE
NITRITE: NEGATIVE
PH: 5.5 (ref 5.0–8.0)
Protein, ur: 30 mg/dL — AB
SPECIFIC GRAVITY, URINE: 1.021 (ref 1.005–1.030)
UROBILINOGEN UA: 0.2 mg/dL (ref 0.0–1.0)

## 2013-04-07 LAB — GI PATHOGEN PANEL BY PCR, STOOL
C DIFFICILE TOXIN A/B: NEGATIVE
Campylobacter by PCR: NEGATIVE
Cryptosporidium by PCR: NEGATIVE
E COLI (STEC): NEGATIVE
E coli (ETEC) LT/ST: NEGATIVE
E coli 0157 by PCR: NEGATIVE
G LAMBLIA BY PCR: NEGATIVE
Norovirus GI/GII: NEGATIVE
Rotavirus A by PCR: POSITIVE
SALMONELLA BY PCR: NEGATIVE
Shigella by PCR: NEGATIVE

## 2013-04-07 LAB — AMYLASE: AMYLASE: 183 U/L — AB (ref 0–105)

## 2013-04-07 LAB — URINE MICROSCOPIC-ADD ON

## 2013-04-07 LAB — TROPONIN I

## 2013-04-07 LAB — STREP PNEUMONIAE URINARY ANTIGEN: STREP PNEUMO URINARY ANTIGEN: NEGATIVE

## 2013-04-07 LAB — PRO B NATRIURETIC PEPTIDE: Pro B Natriuretic peptide (BNP): 415.1 pg/mL (ref 0–450)

## 2013-04-07 LAB — CLOSTRIDIUM DIFFICILE BY PCR: Toxigenic C. Difficile by PCR: NEGATIVE

## 2013-04-07 LAB — LACTIC ACID, PLASMA: Lactic Acid, Venous: 1.3 mmol/L (ref 0.5–2.2)

## 2013-04-07 LAB — MRSA PCR SCREENING: MRSA by PCR: POSITIVE — AB

## 2013-04-07 LAB — HIV ANTIBODY (ROUTINE TESTING W REFLEX): HIV: NONREACTIVE

## 2013-04-07 LAB — LACTATE DEHYDROGENASE: LDH: 262 U/L — ABNORMAL HIGH (ref 94–250)

## 2013-04-07 LAB — LIPASE, BLOOD: Lipase: 27 U/L (ref 11–59)

## 2013-04-07 MED ORDER — ALBUTEROL SULFATE (2.5 MG/3ML) 0.083% IN NEBU
2.5000 mg | INHALATION_SOLUTION | RESPIRATORY_TRACT | Status: DC | PRN
  Administered 2013-04-07 – 2013-04-10 (×6): 2.5 mg via RESPIRATORY_TRACT
  Filled 2013-04-07 (×6): qty 3

## 2013-04-07 MED ORDER — HEPARIN SODIUM (PORCINE) 5000 UNIT/ML IJ SOLN
5000.0000 [IU] | Freq: Three times a day (TID) | INTRAMUSCULAR | Status: DC
  Administered 2013-04-07 – 2013-04-15 (×23): 5000 [IU] via SUBCUTANEOUS
  Filled 2013-04-07 (×26): qty 1

## 2013-04-07 MED ORDER — SIMVASTATIN 20 MG PO TABS
20.0000 mg | ORAL_TABLET | Freq: Every day | ORAL | Status: DC
  Administered 2013-04-08 – 2013-04-11 (×4): 20 mg via ORAL
  Filled 2013-04-07 (×7): qty 1

## 2013-04-07 MED ORDER — LURASIDONE HCL 80 MG PO TABS
160.0000 mg | ORAL_TABLET | Freq: Every day | ORAL | Status: DC
  Filled 2013-04-07 (×2): qty 2

## 2013-04-07 MED ORDER — VANCOMYCIN HCL IN DEXTROSE 1-5 GM/200ML-% IV SOLN
1000.0000 mg | Freq: Once | INTRAVENOUS | Status: AC
  Administered 2013-04-07: 1000 mg via INTRAVENOUS
  Filled 2013-04-07: qty 200

## 2013-04-07 MED ORDER — VANCOMYCIN HCL 500 MG IV SOLR
500.0000 mg | Freq: Two times a day (BID) | INTRAVENOUS | Status: DC
  Filled 2013-04-07: qty 500

## 2013-04-07 MED ORDER — PANTOPRAZOLE SODIUM 40 MG IV SOLR
40.0000 mg | Freq: Every day | INTRAVENOUS | Status: DC
  Administered 2013-04-07 – 2013-04-08 (×2): 40 mg via INTRAVENOUS
  Filled 2013-04-07 (×3): qty 40

## 2013-04-07 MED ORDER — SUCRALFATE 1 GM/10ML PO SUSP
1.0000 g | Freq: Four times a day (QID) | ORAL | Status: DC
  Administered 2013-04-08 – 2013-04-10 (×9): 1 g via ORAL
  Filled 2013-04-07 (×14): qty 10

## 2013-04-07 MED ORDER — THIORIDAZINE HCL 50 MG PO TABS
50.0000 mg | ORAL_TABLET | Freq: Two times a day (BID) | ORAL | Status: DC
  Administered 2013-04-08 – 2013-04-11 (×7): 50 mg via ORAL
  Filled 2013-04-07 (×10): qty 1

## 2013-04-07 MED ORDER — HYDROCERIN EX CREA
TOPICAL_CREAM | Freq: Two times a day (BID) | CUTANEOUS | Status: DC
  Administered 2013-04-07 – 2013-04-09 (×5): via TOPICAL
  Administered 2013-04-10: 1 via TOPICAL
  Administered 2013-04-10: 09:00:00 via TOPICAL
  Administered 2013-04-11 (×2): 1 via TOPICAL
  Administered 2013-04-12 – 2013-04-19 (×12): via TOPICAL
  Filled 2013-04-07 (×2): qty 113

## 2013-04-07 MED ORDER — DEXTROSE 5 % IV SOLN
2.0000 g | Freq: Once | INTRAVENOUS | Status: AC
  Administered 2013-04-07: 2 g via INTRAVENOUS
  Filled 2013-04-07 (×2): qty 2

## 2013-04-07 MED ORDER — IPRATROPIUM-ALBUTEROL 20-100 MCG/ACT IN AERS: 1.0000 | INHALATION_SPRAY | Freq: Four times a day (QID) | RESPIRATORY_TRACT | Status: DC | PRN

## 2013-04-07 MED ORDER — SODIUM CHLORIDE 0.9 % IV SOLN
1000.0000 mL | Freq: Once | INTRAVENOUS | Status: AC
  Administered 2013-04-07: 1000 mL via INTRAVENOUS

## 2013-04-07 MED ORDER — FLUTICASONE PROPIONATE HFA 220 MCG/ACT IN AERO
1.0000 | INHALATION_SPRAY | Freq: Two times a day (BID) | RESPIRATORY_TRACT | Status: DC
  Filled 2013-04-07: qty 12

## 2013-04-07 MED ORDER — POTASSIUM CHLORIDE 20 MEQ PO PACK: 20.0000 meq | PACK | Freq: Every day | ORAL | Status: DC

## 2013-04-07 MED ORDER — POTASSIUM CHLORIDE CRYS ER 20 MEQ PO TBCR: 20.0000 meq | EXTENDED_RELEASE_TABLET | Freq: Every day | ORAL | Status: DC

## 2013-04-07 MED ORDER — MEMANTINE HCL ER 28 MG PO CP24: 1.0000 | ORAL_CAPSULE | Freq: Every day | ORAL | Status: DC

## 2013-04-07 MED ORDER — VANCOMYCIN HCL 500 MG IV SOLR
500.0000 mg | Freq: Two times a day (BID) | INTRAVENOUS | Status: DC
  Administered 2013-04-07 – 2013-04-12 (×11): 500 mg via INTRAVENOUS
  Filled 2013-04-07 (×13): qty 500

## 2013-04-07 MED ORDER — SODIUM CHLORIDE 0.9 % IV SOLN
250.0000 mg | Freq: Four times a day (QID) | INTRAVENOUS | Status: DC
  Administered 2013-04-07 – 2013-04-09 (×7): 250 mg via INTRAVENOUS
  Filled 2013-04-07 (×11): qty 250

## 2013-04-07 MED ORDER — EUCERIN EX CREA: TOPICAL_CREAM | Freq: Two times a day (BID) | CUTANEOUS | Status: DC

## 2013-04-07 MED ORDER — ACETAMINOPHEN 650 MG RE SUPP
650.0000 mg | Freq: Four times a day (QID) | RECTAL | Status: DC | PRN
  Administered 2013-04-07 – 2013-04-10 (×3): 650 mg via RECTAL
  Filled 2013-04-07 (×3): qty 1

## 2013-04-07 MED ORDER — SODIUM CHLORIDE 0.9 % IV SOLN
1000.0000 mL | INTRAVENOUS | Status: DC
  Administered 2013-04-07: 1000 mL via INTRAVENOUS

## 2013-04-07 MED ORDER — ALBUTEROL SULFATE HFA 108 (90 BASE) MCG/ACT IN AERS: 2.0000 | INHALATION_SPRAY | RESPIRATORY_TRACT | Status: DC | PRN

## 2013-04-07 MED ORDER — AZTREONAM 1 G IJ SOLR
1.0000 g | Freq: Three times a day (TID) | INTRAMUSCULAR | Status: DC
  Filled 2013-04-07 (×2): qty 1

## 2013-04-07 MED ORDER — MEMANTINE HCL ER 28 MG PO CP24
1.0000 | ORAL_CAPSULE | Freq: Every day | ORAL | Status: DC
  Administered 2013-04-08 – 2013-04-13 (×5): 28 mg via ORAL
  Filled 2013-04-07 (×7): qty 28

## 2013-04-07 MED ORDER — ACETAMINOPHEN 650 MG RE SUPP
650.0000 mg | Freq: Once | RECTAL | Status: AC
  Administered 2013-04-07: 650 mg via RECTAL
  Filled 2013-04-07: qty 1

## 2013-04-07 MED ORDER — FLUTICASONE PROPIONATE HFA 220 MCG/ACT IN AERO: 1.0000 | INHALATION_SPRAY | Freq: Two times a day (BID) | RESPIRATORY_TRACT | Status: DC

## 2013-04-07 MED ORDER — SODIUM CHLORIDE 0.9 % IV BOLUS (SEPSIS)
1000.0000 mL | Freq: Once | INTRAVENOUS | Status: AC
  Administered 2013-04-07: 1000 mL via INTRAVENOUS

## 2013-04-07 MED ORDER — IPRATROPIUM-ALBUTEROL 0.5-2.5 (3) MG/3ML IN SOLN: 3.0000 mL | Freq: Four times a day (QID) | RESPIRATORY_TRACT | Status: DC | PRN

## 2013-04-07 NOTE — ED Notes (Signed)
Admitting MDs at bedside.

## 2013-04-07 NOTE — Progress Notes (Signed)
When RT made round to see patient, patient was off nasal cannula sats were 85%. RT placed patient back on 4L and added a water bottle. Patient sats now 96%. No other problems noted. RT will continue to monitor.

## 2013-04-07 NOTE — ED Notes (Signed)
Pt hooked up to temp foley which is not working properly reading temp. Rectal temp obtained.

## 2013-04-07 NOTE — ED Notes (Signed)
Secretary made aware to activate Level 1 Code sepsis

## 2013-04-07 NOTE — ED Notes (Signed)
Pt placed back on Bipap. Pt was maintaining 97% 4 L. Admitting MDs worried pt may tire himself out.

## 2013-04-07 NOTE — ED Notes (Signed)
Per EMS- Pt comes from Group Home, yesterday pt hit his head on the door but was fine after. Last night when he went to bed he was fine. This morning he had n/v/d and was lethargic. CBG 158, ST 123. Initially oxygen sats were 67% RA placed on NRB 96% on 15 L. Suctioned airway with mucus production. Audible rattling in lungs. 24 gauge to left foot. When EMS arrived pt was in bathtub where he was being cleaned after diarrhea. Plan to put pt on biapap.

## 2013-04-07 NOTE — ED Notes (Signed)
Manager of group home to bedside. Provided update on patient. Took with him a binder that pt came with including hx.

## 2013-04-07 NOTE — Progress Notes (Addendum)
ANTIBIOTIC CONSULT NOTE - INITIAL  Pharmacy Consult for vancomycin and aztreonam Indication: sepsis  Allergies  Allergen Reactions  . Penicillins     Unsure of reaction    Patient Measurements:     Vital Signs: Temp: 102.7 F (39.3 C) (03/18 0825) Temp src: Rectal (03/18 0825) BP: 86/52 mmHg (03/18 0826) Pulse Rate: 104 (03/18 0826) Intake/Output from previous day:   Intake/Output from this shift:    Labs:  Recent Labs  04/06/13 1514 04/07/13 0810  WBC 13.4* 13.4*  HGB 12.8* 14.5  PLT 304 308  CREATININE 1.06  --    The CrCl is unknown because both a height and weight (above a minimum accepted value) are required for this calculation. No results found for this basename: VANCOTROUGH, VANCOPEAK, VANCORANDOM, GENTTROUGH, GENTPEAK, GENTRANDOM, TOBRATROUGH, TOBRAPEAK, TOBRARND, AMIKACINPEAK, AMIKACINTROU, AMIKACIN,  in the last 72 hours   Microbiology: No results found for this or any previous visit (from the past 720 hour(s)).  Medical History: Past Medical History  Diagnosis Date  . COPD (chronic obstructive pulmonary disease)   . History of stomach ulcers   . Retardation   . Anemia   . Hypertension   . GERD (gastroesophageal reflux disease)   . Dementia     Assessment: [age] YOM brought in with n/v/d and lethargy, low O2 sats. Last weight known is 65kg- patient unable to update weight at this time and no caretakers with him. SCr 1.17 with est CrCl ~3250mL/min. WBC elevated at 13.4, lactic acid 2.75. Febrile up to 102.7. Noted penicillin allergy- reaction unknown, and cannot clarify with patient at this time. Aztreonam 2g IV x1 and vancomycin 1g IV x1 ordered in ED. Delivered to RN by this RPh. Urine, sputum and blood cultures sent.  Goal of Therapy:  Vancomycin trough level 15-20 mcg/ml  Plan:  1. Aztreonam 1g IV q8h starting at 1500 2. Vancomycin 500mg  IV q12h starting at 2100 3. Follow updated height and weight, renal function, clinical progression,  c/s, LOT, trough at [employer]  Kriya Westra D. Ahmani Prehn, PharmD, BCPS Clinical Pharmacist Pager: [phone] 04/07/2013 9:09 AM  ADDENDUM Antibiotics changed to vancomycin and imipenem/cilastin. No other new information.  Plan: 1. Continue vancomycin as above 2. Imipenem/cilastin 250mg  IV q6h 3. Follow updated height and weight, renal function, clinical progression, c/s, LOT, trough at [employer]  Dawnna Gritz D. Zurie Platas, PharmD, BCPS Clinical Pharmacist Pager: [phone] 04/07/2013 2:23 PM

## 2013-04-07 NOTE — Progress Notes (Signed)
Called Dr. Versie StarksFletke's office and notified that pt has arrived to floor 2 central. Called and notified e-link. Called and notified central telemetry. Pt resting on bipap.

## 2013-04-07 NOTE — H&P (Signed)
[facility] History and Physical Service Pager: [phone]  Patient name: [name] [name] Medical record number: [id number] Date of birth: Oct 06, 1934 Age: 79 y.o. Gender: male  Primary Care Provider: Jacquelin HawkingNettey, Ralph, MD Consultants: None Code Status: Full (DSS next of contact)   Chief Complaint: Respiratory Distress   Assessment and Plan: [name] [name] is a 79 y.o. male presenting with respiratory distress secondary to PNA . PMH is significant for MR, COPD/Asthma, tobacco abuse.   #Severe Sepsis secondary to HCAP/Aspiration PNA w/ Acute Respiratory Distress- Initial LA elevated to 2.75 with known source of infxn in PNA (Probable combo of HCAP/Aspiration) - Admit to SDU, vitals per unit -Vanc and Primaxin, as pt is PCN allergic and want to cover Anaerobics.  Discussed case with pharmacy  - 2 L NS bolus in ED, will give at least another 1 L and monitor BP closely, may require an additional 1-2 L - Repeat CXR if becomes acutely hypoxemic - Continue BiPap at current 10/5 w/ O2 of 40% - Repeat CBC and BMET in AM  - IS while pt is awake and able  - F/U BCx, respiratory Cx, Legionella, S. Pneumo, Gram Stain  - Low clinical threshold to consult CCM if becomes hypoxemic on BiPAP, may need intubation for protection/failure to oxygenate secondary to PNA  #Metabolic Acidosis, Combined chronic respiratory and acute AG - Most likely chronic respiratory acidosis with acute AG metabolic acidosis and possible metabolic alkalosis.  ABG showing pH of 7.327, pCO2 of 50, AG of 16, and Bicarb of 26.  AG component most likely from LA of 2.75, chronic respiratory acidosis showing pCO2 of 50 despite expected Bicarb to be 28 and is 26, and bicarb component most likely mix of acute on chronic acidosis w/ alkalosis from emesis.  Delta Delta of 2 which would further corroborate this finding.  -Repeat BMET later today to evaluate AG and Bicarb -Repeat ABG as needed or if becomes  hypoxic  #Hx of COPD and Asthma - Most likely cause of his Chronic Respiratory Acidosis -Continue home meds -Monitor O2 sats   #MR/Psychiatry Mood Disorder - Continue home medications including Latuda, Thioridazine,   #Diarrhea  - F/u GI panel  - Holding Laxatives  # Hx of HTN  - Holding home Metoprolol/Dyazide   FEN/GI: 125 cc/hr, NPO pending clinical improvement  Prophylaxis: Heparin SQ  Disposition: SDU pending clinical improvement   History of Present Illness: [name] [name] is a 79 y.o. male presenting with nausea vomiting and respiratory distress found to have aspiration PNA.  Pt seen last night in [facility] due to diarrhea, had multiple evaluations performed, and GI panel pending, was dx and sent back to his group home.  Patient brought to the ER by ambulance this AM from this group home due to respiratory distress and lethargy. Pt reportedly suffer a minor head injury yesterday.  No loss of consciousness.  Patient reportedly had nausea, vomiting and diarrhea through the night. This morning he was lethargic and struggling to breathe, EMS was called. They report oxygen saturation 67% on room air upon arrival, was started on NRB and had improvement on his O2. Upon arrival to the ER,  Level V Caveat due to distress and mental retardation.  Multiple evaluations were performed and showed a pH of 7.327, pCO2 50.0, Bicarb of 25.6, LA of 2.75, WBC 13.4 w/ L shift, CXR w/ bibasilar patchy infiltrates L > R, AG of 16 w/ Bicarb of 26, BCx drawn, EKG performed.    Review Of  Systems: Per HPI  Patient Active Problem List   Diagnosis Date Noted  . Aspiration pneumonia 04/07/2013  . Well adult exam 11/27/2012  . Fall on steps 12/12/2011  . Chest wall pain 12/12/2011  . Laceration of ear 05/08/2011  . Conjunctivitis 04/04/2011  . Allergic rhinitis 04/04/2010  . ECZEMA 12/01/2009  . TOBACCO USE, QUIT 07/07/2009  . BELLS PALSY 02/07/2009  . CHOLELITHIASIS 04/06/2008  . ESOPHAGEAL ULCER, WITH  BLEEDING 08/17/2007  . BARRETTS ESOPHAGUS 08/17/2007  . GI BLEEDING 08/05/2007  . COPD 04/01/2007  . HYPERLIPIDEMIA 05/05/2006  . HYPOKALEMIA 05/05/2006  . ANEMIA, IRON DEFICIENCY NOS 05/05/2006  . PSYCHOSIS, UNSPECIFIED 03/20/2006  . MENTAL RETARDATION 03/20/2006  . HYPERTENSION, BENIGN SYSTEMIC 03/20/2006  . ASTHMA, INTERMITTENT 03/20/2006  . GASTROESOPHAGEAL REFLUX, NO ESOPHAGITIS 03/20/2006  . HERNIA, HIATAL, NONCONGENITAL 03/20/2006   Past Medical History: Past Medical History  Diagnosis Date  . COPD (chronic obstructive pulmonary disease)   . History of stomach ulcers   . Retardation   . Anemia   . Hypertension   . GERD (gastroesophageal reflux disease)   . Dementia    Past Surgical History: Past Surgical History  Procedure Laterality Date  . Gastrectomy     Social History: History  Substance Use Topics  . Smoking status: Former Games developer  . Smokeless tobacco: Not on file  . Alcohol Use: No    Family History: No family history on file. Allergies and Medications: Allergies  Allergen Reactions  . Penicillins     Unsure of reaction   No current facility-administered medications on file prior to encounter.   Current Outpatient Prescriptions on File Prior to Encounter  Medication Sig Dispense Refill  . albuterol (PROAIR HFA) 108 (90 BASE) MCG/ACT inhaler Inhale 2 puffs into the lungs every 4 (four) hours as needed. For  SOB/wheezing. Use with spacer       . ARTIFICIAL TEARS ophthalmic solution Place 1 drop into the left eye 3 (three) times daily as needed.       . cetirizine (ZYRTEC) 10 MG chewable tablet Chew 1 tablet (10 mg total) by mouth daily.  30 tablet  11  . cetirizine (ZYRTEC) 10 MG tablet Take 10 mg by mouth daily.      . diphenoxylate-atropine (LOMOTIL) 2.5-0.025 MG per tablet Take 2 tablets by mouth 4 (four) times daily as needed for diarrhea or loose stools.  30 tablet  0  . docusate sodium (COLACE) 100 MG capsule Take 100 mg by mouth 2 (two) times  daily. Scheduled      . ENSURE (ENSURE) Drink by mouth every other day  946 mL  11  . esomeprazole (NEXIUM) 40 MG capsule Take 40 mg by mouth 2 (two) times daily before a meal.      . fluticasone (FLOVENT HFA) 220 MCG/ACT inhaler Inhale 1 puff into the lungs 2 (two) times daily.  1 Inhaler  11  . Ipratropium-Albuterol (COMBIVENT) 20-100 MCG/ACT AERS respimat Inhale 1 puff into the lungs every 6 (six) hours as needed for wheezing.  4 g  6  . lurasidone (LATUDA) 80 MG TABS Take 160 mg by mouth daily with breakfast.      . Memantine HCl ER (NAMENDA XR) 28 MG CP24 Take 1 capsule by mouth daily after breakfast.      . metoprolol succinate (TOPROL-XL) 50 MG 24 hr tablet Take 50 mg by mouth daily. Take with or immediately following a meal.      . polyethylene glycol (MIRALAX / GLYCOLAX) packet  Take 17 g by mouth 2 (two) times daily as needed for mild constipation.      . potassium chloride (KLOR-CON) 20 MEQ packet Take 20 mEq by mouth daily.       . simvastatin (ZOCOR) 20 MG tablet Take 20 mg by mouth at bedtime.        . Skin Protectants, Misc. (EUCERIN) cream Apply topically 2 (two) times daily. Apply liberally to skin twice a day. Disp 1 large jar       . sucralfate (CARAFATE) 1 GM/10ML suspension Take 1 g by mouth 4 (four) times daily.       Marland Kitchen thioridazine (MELLARIL) 25 MG tablet Take 50 mg by mouth 2 (two) times daily.      Marland Kitchen triamterene-hydrochlorothiazide (DYAZIDE) 37.5-25 MG per capsule Take 1 capsule by mouth daily.          Objective: BP 91/58  Pulse 91  Temp(Src) 102.7 F (39.3 C) (Rectal)  Resp 20  SpO2 100% Exam: General: Mild Distress, comfortable on BiPAP HEENT: [place]/AT, dry MM, PERRLA, EOMI B/L  Cardiovascular: Tachycardic, regular rhythm, no murmurs appreciated Respiratory: Increased WOB, Bibasilar Crackles L > R, diffuse wheezing, no accessory muscle use  Abdomen: Soft/NT/ND, NABS, No HSM Extremities: no pedal edema, + 2 DP/PT B/L  Skin: In tact, dry, warm  Neuro: No  focal deficits  Labs and Imaging: CBC BMET   Recent Labs Lab 04/07/13 0810  WBC 13.4*  HGB 14.5  HCT 42.9  PLT 308    Recent Labs Lab 04/07/13 0810  NA 137  K 3.5*  CL 95*  CO2 26  BUN 27*  CREATININE 1.17  GLUCOSE 147*  CALCIUM 8.4     CXR 04/07/13 Increased density predominantly at the left lung base is consistent  with acute atelectasis or pneumonia. There are chronic fibrotic  changes in both lungs. There is no evidence of pulmonary edema.  Briscoe Deutscher, DO 04/07/2013, 10:16 AM PGY-2, [facility] FPTS Intern pager: [phone], text pages welcome

## 2013-04-07 NOTE — Telephone Encounter (Signed)
[facility]

## 2013-04-07 NOTE — ED Provider Notes (Signed)
CSN: [id number]     Arrival date & time 04/07/13  [id number] History   First MD Initiated Contact with Patient 04/07/13 0757     Chief Complaint  Patient presents with  . Respiratory Distress     (Consider location/radiation/quality/duration/timing/severity/associated sxs/prior Treatment) HPI Comments: Patient brought to the ER by ambulance from the group home where he resides. Patient was found to be lethargic and having difficulty breathing this morning. Patient was reportedly seen in ER yesterday for diarrhea. He went back to the group home and was doing well the course of yesterday. He did, however, reportedly suffer a minor head injury yesterday. Reportedly opened it to her and it hit him in the forehead. No loss of consciousness. Patient had no complaints after the injury. He has bruising on his forehead.  Patient reportedly had nausea, vomiting and diarrhea through the night. This morning he was lethargic and struggling to breathe, EMS was called. They report oxygen saturation 67% on room air upon arrival. He improved on nonrebreather. Upon arrival to the ER, patient is in distress, not answering any questions. He has baseline mental retardation. Level V Caveat due to distress and mental retardation.   Past Medical History  Diagnosis Date  . COPD (chronic obstructive pulmonary disease)   . History of stomach ulcers   . Retardation   . Anemia   . Hypertension   . GERD (gastroesophageal reflux disease)   . Dementia    Past Surgical History  Procedure Laterality Date  . Gastrectomy     No family history on file. History  Substance Use Topics  . Smoking status: Former Games developermoker  . Smokeless tobacco: Not on file  . Alcohol Use: No    Review of Systems  Unable to perform ROS: Severe respiratory distress      Allergies  Penicillins  Home Medications   Current Outpatient Rx  Name  Route  Sig  Dispense  Refill  . albuterol (PROAIR HFA) 108 (90 BASE) MCG/ACT inhaler  Inhalation   Inhale 2 puffs into the lungs every 4 (four) hours as needed. For  SOB/wheezing. Use with spacer          . ARTIFICIAL TEARS ophthalmic solution   Left Eye   Place 1 drop into the left eye 3 (three) times daily as needed.          Marland Kitchen. EXPIRED: cetirizine (ZYRTEC) 10 MG chewable tablet   Oral   Chew 1 tablet (10 mg total) by mouth daily.   30 tablet   11   . cetirizine (ZYRTEC) 10 MG tablet   Oral   Take 10 mg by mouth daily.         . diphenoxylate-atropine (LOMOTIL) 2.5-0.025 MG per tablet   Oral   Take 2 tablets by mouth 4 (four) times daily as needed for diarrhea or loose stools.   30 tablet   0   . docusate sodium (COLACE) 100 MG capsule   Oral   Take 100 mg by mouth 2 (two) times daily. Scheduled         . ENSURE (ENSURE)      Drink 237ml by mouth every other day   946 mL   11   . esomeprazole (NEXIUM) 40 MG capsule   Oral   Take 40 mg by mouth 2 (two) times daily before a meal.         . fluticasone (FLOVENT HFA) 220 MCG/ACT inhaler   Inhalation   Inhale 1 puff into the  lungs 2 (two) times daily.   1 Inhaler   11   . Ipratropium-Albuterol (COMBIVENT) 20-100 MCG/ACT AERS respimat   Inhalation   Inhale 1 puff into the lungs every 6 (six) hours as needed for wheezing.   4 g   6     Start this when patient finishes current supply of ...   . lurasidone (LATUDA) 80 MG TABS   Oral   Take 160 mg by mouth daily with breakfast.         . Memantine HCl ER (NAMENDA XR) 28 MG CP24   Oral   Take 1 capsule by mouth daily after breakfast.         . metoprolol succinate (TOPROL-XL) 50 MG 24 hr tablet   Oral   Take 50 mg by mouth daily. Take with or immediately following a meal.         . polyethylene glycol (MIRALAX / GLYCOLAX) packet   Oral   Take 17 g by mouth 2 (two) times daily as needed for mild constipation.         . potassium chloride (KLOR-CON) 20 MEQ packet   Oral   Take 20 mEq by mouth daily.          . simvastatin  (ZOCOR) 20 MG tablet   Oral   Take 20 mg by mouth at bedtime.           . Skin Protectants, Misc. (EUCERIN) cream   Topical   Apply topically 2 (two) times daily. Apply liberally to skin twice a day. Disp 1 large jar          . sucralfate (CARAFATE) 1 GM/10ML suspension   Oral   Take 1 g by mouth 4 (four) times daily.          Marland Kitchen thioridazine (MELLARIL) 25 MG tablet   Oral   Take 50 mg by mouth 2 (two) times daily.         Marland Kitchen triamterene-hydrochlorothiazide (DYAZIDE) 37.5-25 MG per capsule   Oral   Take 1 capsule by mouth daily.            BP 107/78  Pulse 102  Temp(Src) 102.7 F (39.3 C) (Rectal)  Resp 25  SpO2 100% Physical Exam  Constitutional: He appears well-developed and well-nourished. He appears distressed.  HENT:  Head: Normocephalic. Head is with contusion.    Right Ear: Hearing normal.  Left Ear: Hearing normal.  Nose: Nose normal.  Mouth/Throat: Oropharynx is clear and moist and mucous membranes are normal.  Eyes: Conjunctivae and EOM are normal. Pupils are equal, round, and reactive to light.  Neck: Normal range of motion. Neck supple.  Cardiovascular: Regular rhythm, S1 normal and S2 normal.  Tachycardia present.  Exam reveals no gallop and no friction rub.   No murmur heard. Pulmonary/Chest: He is in respiratory distress. He has rhonchi. He has rales. He exhibits no tenderness.  Abdominal: Soft. Normal appearance and bowel sounds are normal. There is no hepatosplenomegaly. There is no tenderness. There is no rebound, no guarding, no tenderness at McBurney's point and negative Murphy's sign. No hernia.  Musculoskeletal: Normal range of motion.  Neurological: He is alert. He has normal strength. No cranial nerve deficit or sensory deficit. Coordination normal. GCS eye subscore is 4. GCS verbal subscore is 4. GCS motor subscore is 6.  Skin: Skin is warm, dry and intact. No rash noted. No cyanosis.  Psychiatric: His mood appears anxious.    ED  Course  Procedures (  including critical care time) Labs Review Labs Reviewed  CBC WITH DIFFERENTIAL - Abnormal; Notable for the following:    WBC 13.4 (*)    RDW 16.0 (*)    Neutrophils Relative % 88 (*)    Neutro Abs 11.8 (*)    Lymphocytes Relative 6 (*)    All other components within normal limits  BASIC METABOLIC PANEL - Abnormal; Notable for the following:    Potassium 3.5 (*)    Chloride 95 (*)    Glucose, Bld 147 (*)    BUN 27 (*)    GFR calc non Af Amer 59 (*)    GFR calc Af Amer 68 (*)    All other components within normal limits  URINALYSIS, ROUTINE W REFLEX MICROSCOPIC - Abnormal; Notable for the following:    APPearance CLOUDY (*)    Protein, ur 30 (*)    All other components within normal limits  URINE MICROSCOPIC-ADD ON - Abnormal; Notable for the following:    Casts HYALINE CASTS (*)    All other components within normal limits  I-STAT CG4 LACTIC ACID, ED - Abnormal; Notable for the following:    Lactic Acid, Venous 2.75 (*)    All other components within normal limits  URINE CULTURE  CULTURE, BLOOD (ROUTINE X 2)  CULTURE, BLOOD (ROUTINE X 2)  PRO B NATRIURETIC PEPTIDE  TROPONIN I   Imaging Review Dg Chest Port 1 View  04/07/2013   CLINICAL DATA:  Dyspnea  EXAM: PORTABLE CHEST - 1 VIEW  COMPARISON:  DG CHEST 2 VIEW dated 12/12/2011  FINDINGS: The lungs are mildly hyperinflated. The interstitial markings are increased in the left mid and lower lung as compared to the previous study. There is stable scarring at both lung bases. The cardiac silhouette is not enlarged. The pulmonary vascularity is not engorged. The mediastinum is normal in width. There is a trace of blunting of the left lateral costophrenic angle. The observed portions of the bony thorax exhibit no acute abnormalities.  IMPRESSION: Increased density predominantly at the left lung base is consistent with acute atelectasis or pneumonia. There are chronic fibrotic changes in both lungs. There is no  evidence of pulmonary edema.   Electronically Signed   By: David  [place]   On: 04/07/2013 08:45     EKG Interpretation None      Date: 04/07/2013  Rate: 122  Rhythm: sinus tachycardia  QRS Axis: normal  Intervals: normal  ST/T Wave abnormalities: nonspecific ST/T changes  Conduction Disutrbances:none  Narrative Interpretation:   Old EKG Reviewed: tachycardia, ST changes are new since last EKG    MDM   Final diagnoses:  Aspiration pneumonia    Patient presented to the ER in acute respiratory distress. Patient had hypoxia and significant dyspnea upon arrival. His records from yesterday were reviewed. Patient had previously had diarrhea, but apparently had developed nausea and vomiting overnight. EMS reports that he was going to vomit when they found him. He now is exhibiting acute respiratory distress and this is suspicious for aspiration. Chest x-ray does show evidence of pneumonia at the left base.  Based on the patient's hypotension, tachycardia and fever, code sepsis was initiated. Patient was treated with empiric coverage for pulmonary pathogens, aztreonam and vancomycin. The patient was administered IV fluids with improvement of his blood pressure.  Based on the amount of respiratory distress on arrival, patient was placed on BiPAP. He is tolerating the BiPAP well. Oxygen saturation is improved and he appears to be breathing more  comfortably. He has stabilized and is appropriate for this at this point. Patient discussed with family practice, will see the patient in the ER.  CRITICAL CARE Performed by: Gilda Crease   Total critical care time:  Critical care time was exclusive of separately billable procedures and treating other patients.  Critical care was necessary to treat or prevent imminent or life-threatening deterioration.  Critical care was time spent personally by me on the following activities: development of treatment plan with patient and/or  surrogate as well as nursing, discussions with consultants, evaluation of patient's response to treatment, examination of patient, obtaining history from patient or surrogate, ordering and performing treatments and interventions, ordering and review of laboratory studies, ordering and review of radiographic studies, pulse oximetry and re-evaluation of patient's condition.   Gilda Crease, MD 04/07/13 [phone]

## 2013-04-07 NOTE — ED Notes (Signed)
Admitting MD to bedside 

## 2013-04-07 NOTE — H&P (Addendum)
FMTS Attending Note  I personally saw and evaluated the patient. The plan of care was discussed with the resident team. I agree with the assessment and plan as documented by the resident.   79 y/o male with PMH MR, Psychosis, COPD, Asthma, Tobacco abuse, Gerd, and HTN presenting with severe respiratory distress. Patient is unable to provide any significant HPI due to acute illness and MR. Please refer to resident note for HPI.   Vitals: reviewed Gen: Caucasian male, currently on 6 L [place] (taken off BiPaP 20 minutes prior to exam per RN staff), appears in respiratory distresss HEENT: normocephalic, PERRL, EOMI, dry mucous membranes, uvula midline, neck supple, no anterior or posterior cervical lymphadenopathy Cardiac: RRR, S1 and S2 present however difficult to hear to to wheezing/coarse breath sounds, no murmurs appreciated, no heaves/thrills Resp: Coarse breath sounds, decreased breath sounds at bases, accessory muscle use noted Abd: soft, no tenderness, normal bowel sounds, no rebound, no guarding Ext: no edema noted, 2+ radial pulses bilaterally, cool extremities, 2+ DP/PT pulses bilateraly Skin: intact, no rash Neuro: patient awake, does not answer questions or follow commands, moves all extremities, cranial nerves appear intact  79 y/o male presents in acute respiratory distress.   1. Acute on Chronic Respiratory Failure - suspect due to HCAP, may have component of aspiration pneumonia, agree with Vancomycin and Primaxin, await blood and respiratory cultures, patient to be placed back on BiPaP machine, currently he is able to maintain his airway however will make CCM aware of patient in case of need for emergent intubation 2. Mixed Respiratory Acidosis (CO2 retention), Metabolic acidosis (elevated lactic acid), and Metabolic Alkalosis (vomiting) - treat respiratory acidosis with BiPAP, monitor AG and Bicarb as outlined in resident note 3. MR/History of psychosis - continue home meds as long as  patient is able to tolerate PO meds (will order bedside swallow evaluation).  4. Diarrhea - agree with follow up GI panel, GI exam non-focal at this time 5. Hypotension - hold home BP meds, s/p 3 L NS in ED, continue IVF, discuss need for pressors with CCM if hypotension continues, no evidence for end organ damage at this time 6. COPD - continue home breathing treatments.  Donnella ShamKyle Lazer Wollard MD

## 2013-04-07 NOTE — Progress Notes (Signed)
CRITICAL VALUE ALERT  Critical value received:  PCR MRSA Positive  Date of notification:  04/07/2013   Time of notification:  6:22 PM   Critical value read back : YES Nurse who received alert:  Gerald Dexterata Carbone, RN  MD notified (1st page):  Riki RuskJeremy, resident [phone]  Time of first page:  [phone]  MD notified (2nd page):  Time of second page:  Responding MD:  Riki RuskJeremy  Time MD responded:  [phone]

## 2013-04-07 NOTE — ED Notes (Signed)
Lactic acid results given to primary nurse Maralyn SagoSarah

## 2013-04-07 NOTE — ED Notes (Signed)
We have returned from CT with patient.

## 2013-04-07 NOTE — ED Notes (Signed)
Pharmacy made aware need Azactam.

## 2013-04-07 NOTE — Progress Notes (Signed)
**Note De-Identified  Obfuscation** RT note: patient removed from BIPAP and placed on 4L [place].  BBS rhonchi

## 2013-04-07 NOTE — Progress Notes (Signed)
PGY-2 Update Note  Pt seen at bedside with Dr. Richarda BladeAdamo. Currently sats >95% on 4L but with respirations in low-to-mid 20's. Overall with coarse breath sounds as before, but relatively stable. Appreciate CCM consult; if worsens or if appears to be needing NIMV, will need intubation and transfer to ICU. Will monitor closely overnight.  [name] Mortonhristopher M Elycia Woodside, MD PGY-2, [facility] 04/07/2013, 10:00 PM FPTS Service pager: [phone] (text pages welcome through [employer])

## 2013-04-07 NOTE — Consult Note (Signed)
PULMONARY / CRITICAL CARE MEDICINE   Name: [name] [name] MRN: [id number] DOB: November 12, 1934    ADMISSION DATE:  04/07/2013 CONSULTATION DATE:  04/07/13  REFERRING MD :  FPTS PRIMARY SERVICE: FPTS  CHIEF COMPLAINT:  resp distress, PNA, sepsis  BRIEF PATIENT DESCRIPTION: 79 y/o male with PMH MR, Psychosis, COPD, Asthma, Tobacco abuse, Gerd, and HTN presenting with severe respiratory distress.  SIGNIFICANT EVENTS / STUDIES:  3/18- distress, NIMV, responding to volume  LINES / TUBES: PIV  CULTURES: BC 3/18>>> Sputum 3/8>>>  ANTIBIOTICS: 3.18 Imipenem>>> 3.18 vanc>>>  HISTORY OF PRESENT ILLNESS:  [name] [name] is a 79 y.o. male presenting with nausea vomiting and respiratory distress found to have aspiration PNA. Pt seen last night in [facility] due to diarrhea, had multiple evaluations performed, and GI panel pending, was dx and sent back to his group home. Patient brought to the ER by ambulance this AM from this group home due to respiratory distress and lethargy. Patient reportedly had nausea, vomiting and diarrhea through the night. This morning he was lethargic and struggling to breathe, EMS was called. They report oxygen saturation 67% on room air upon arrival, was started on NRB and had improvement on his O2. IN ed received aggressive volume and responded to volume. Still being treated with NIMV. Called to assist. Since adnmit to sdu, no diareha noted   PAST MEDICAL HISTORY :  Past Medical History  Diagnosis Date  . COPD (chronic obstructive pulmonary disease)   . History of stomach ulcers   . Retardation   . Anemia   . Hypertension   . GERD (gastroesophageal reflux disease)   . Dementia    Past Surgical History  Procedure Laterality Date  . Gastrectomy     Prior to Admission medications   Medication Sig Start Date End Date Taking? Authorizing Provider  albuterol (PROAIR HFA) 108 (90 BASE) MCG/ACT inhaler Inhale 2 puffs into the lungs every 4 (four) hours as needed. For   SOB/wheezing. Use with spacer     Historical Provider, MD  ARTIFICIAL TEARS ophthalmic solution Place 1 drop into the left eye 3 (three) times daily as needed.     Historical Provider, MD  cetirizine (ZYRTEC) 10 MG chewable tablet Chew 1 tablet (10 mg total) by mouth daily. 11/02/10 11/02/11  Edd Arbour, MD  cetirizine (ZYRTEC) 10 MG tablet Take 10 mg by mouth daily.    Historical Provider, MD  diphenoxylate-atropine (LOMOTIL) 2.5-0.025 MG per tablet Take 2 tablets by mouth 4 (four) times daily as needed for diarrhea or loose stools. 04/06/13   Toy Baker, MD  docusate sodium (COLACE) 100 MG capsule Take 100 mg by mouth 2 (two) times daily. Scheduled    Historical Provider, MD  ENSURE (ENSURE) Drink by mouth every other day 10/09/12   Jacquelin Hawking, MD  esomeprazole (NEXIUM) 40 MG capsule Take 40 mg by mouth 2 (two) times daily before a meal.    Historical Provider, MD  fluticasone (FLOVENT HFA) 220 MCG/ACT inhaler Inhale 1 puff into the lungs 2 (two) times daily. 11/02/10   Edd Arbour, MD  Ipratropium-Albuterol (COMBIVENT) 20-100 MCG/ACT AERS respimat Inhale 1 puff into the lungs every 6 (six) hours as needed for wheezing. 06/04/11   Edd Arbour, MD  lurasidone (LATUDA) 80 MG TABS Take 160 mg by mouth daily with breakfast.    Historical Provider, MD  Memantine HCl ER (NAMENDA XR) 28 MG CP24 Take 1 capsule by mouth daily after breakfast.    Historical Provider, MD  metoprolol  succinate (TOPROL-XL) 50 MG 24 hr tablet Take 50 mg by mouth daily. Take with or immediately following a meal.    Historical Provider, MD  polyethylene glycol (MIRALAX / GLYCOLAX) packet Take 17 g by mouth 2 (two) times daily as needed for mild constipation.    Historical Provider, MD  potassium chloride (KLOR-CON) 20 MEQ packet Take 20 mEq by mouth daily.     Historical Provider, MD  simvastatin (ZOCOR) 20 MG tablet Take 20 mg by mouth at bedtime.      Historical Provider, MD  Skin Protectants, Misc. (EUCERIN)  cream Apply topically 2 (two) times daily. Apply liberally to skin twice a day. Disp 1 large jar     Historical Provider, MD  sucralfate (CARAFATE) 1 GM/10ML suspension Take 1 g by mouth 4 (four) times daily.     Historical Provider, MD  thioridazine (MELLARIL) 25 MG tablet Take 50 mg by mouth 2 (two) times daily.    Historical Provider, MD  triamterene-hydrochlorothiazide (DYAZIDE) 37.5-25 MG per capsule Take 1 capsule by mouth daily.      Historical Provider, MD   Allergies  Allergen Reactions  . Penicillins     Unsure of reaction    FAMILY HISTORY:  No family history on file. SOCIAL HISTORY:  reports that he has quit smoking. He does not have any smokeless tobacco history on file. He reports that he does not drink alcohol or use illicit drugs.  REVIEW OF SYSTEMS:  Unable pt not speaking  SUBJECTIVE: no distress I tried him off nimv  VITAL SIGNS: Temp:  [98 F (36.7 C)-102.7 F (39.3 C)] 101.5 F (38.6 C) (03/18 1418) Pulse Rate:  [72-123] 89 (03/18 1555) Resp:  [20-35] 23 (03/18 1742) BP: (86-126)/(43-78) 111/43 mmHg (03/18 1439) SpO2:  [92 %-100 %] 96 % (03/18 1742) FiO2 (%):  [40 %-80 %] 40 % (03/18 1555) Weight:  [64 kg (141 lb 1.5 oz)] 64 kg (141 lb 1.5 oz) (03/18 1418) HEMODYNAMICS:   VENTILATOR SETTINGS: Vent Mode:  [-] BIPAP FiO2 (%):  [40 %-80 %] 40 % Set Rate:  [15 bmp] 15 bmp INTAKE / OUTPUT: Intake/Output   None     PHYSICAL EXAMINATION: General:  Mild at most distress Neuro:  Moves ext, agitation mild HEENT:  jvd wnl or flat Cardiovascular:  s1 s2 RRT mild tachy Lungs:  Ronchi, reduced left Abdomen:  Soft, BS wnl, no rg Musculoskeletal:  No edema Skin:  No rash  LABS:  CBC  Recent Labs Lab 04/06/13 1514 04/07/13 0810  WBC 13.4* 13.4*  HGB 12.8* 14.5  HCT 38.4* 42.9  PLT 304 308   Coag's No results found for this basename: APTT, INR,  in the last 168 hours BMET  Recent Labs Lab 04/06/13 1514 04/07/13 0810 04/07/13 1407  NA 137  137 137  K 3.7 3.5* 3.2*  CL 99 95* 98  CO2 25 26 23   BUN 24* 27* 24*  CREATININE 1.06 1.17 1.03  GLUCOSE 85 147* 92   Electrolytes  Recent Labs Lab 04/06/13 1514 04/07/13 0810 04/07/13 1407  CALCIUM 8.7 8.4 7.8*   Sepsis Markers  Recent Labs Lab 04/07/13 0823  LATICACIDVEN 2.75*   ABG  Recent Labs Lab 04/07/13 1009  PHART 7.327*  PCO2ART 50.0*  PO2ART 238.0*   Liver Enzymes  Recent Labs Lab 04/06/13 1514  AST 37  ALT 20  ALKPHOS 92  BILITOT 0.3  ALBUMIN 3.5   Cardiac Enzymes  Recent Labs Lab 04/07/13 0810  TROPONINI <0.30  PROBNP 415.1   Glucose No results found for this basename: GLUCAP,  in the last 168 hours  Imaging Ct Head Wo Contrast  04/07/2013   CLINICAL DATA:  Hit head on the door.  Nausea and vomiting  EXAM: CT HEAD WITHOUT CONTRAST  TECHNIQUE: Contiguous axial images were obtained from the base of the skull through the vertex without intravenous contrast.  COMPARISON:  CT HEAD W/O CM dated 02/01/2009  FINDINGS: There is no evidence of mass effect, midline shift, or extra-axial fluid collections. There is no evidence of a space-occupying lesion or intracranial hemorrhage. There is no evidence of a cortical-based area of acute infarction. There is generalized cerebral atrophy. There is periventricular white matter low attenuation likely secondary to microangiopathy.  The ventricles and sulci are appropriate for the patient's age. The basal cisterns are patent.  Visualized portions of the orbits are unremarkable. The visualized portions of the paranasal sinuses and mastoid air cells are unremarkable. Cerebrovascular atherosclerotic calcifications are noted.  The osseous structures are unremarkable.  IMPRESSION: No acute intracranial pathology.   Electronically Signed   By: Elige Ko   On: 04/07/2013 11:10   Dg Chest Port 1 View  04/07/2013   CLINICAL DATA:  Dyspnea  EXAM: PORTABLE CHEST - 1 VIEW  COMPARISON:  DG CHEST 2 VIEW dated 12/12/2011   FINDINGS: The lungs are mildly hyperinflated. The interstitial markings are increased in the left mid and lower lung as compared to the previous study. There is stable scarring at both lung bases. The cardiac silhouette is not enlarged. The pulmonary vascularity is not engorged. The mediastinum is normal in width. There is a trace of blunting of the left lateral costophrenic angle. The observed portions of the bony thorax exhibit no acute abnormalities.  IMPRESSION: Increased density predominantly at the left lung base is consistent with acute atelectasis or pneumonia. There are chronic fibrotic changes in both lungs. There is no evidence of pulmonary edema.   Electronically Signed   By: David  [place]   On: 04/07/2013 08:45     CXR: left base effusion, atx, infiltrate, rt base linear infiltrate  ASSESSMENT / PLAN:  PULMONARY A:Aspiration PNA, Acute resp failure, r/o effusion left, poor airway protection skills P:   Holding NIMV for now, likely NOT a good candidate as poor airway protection skills pcxr now for reduced BS Hold NIMV and follow clinically i have changed the order for NIMV for PRN, but if required would move to unit and intubate He appears to be improving with resp status as of now pcxr in am  ABG re assuring All above d/w RT  CARDIOVASCULAR A: severe sepsis P:  Continue volume Seems to have responded well to volume Repeat lactic acid Cortisol tsh Low threshold to place line  GASTROINTESTINAL A:  N/V/D, r/o gastroenteritis P:   Consider send stool for WBC, O and P, culture for enterics and cdiff (group home) Npo lft Amy, lip, ldh  INFECTIOUS A:  Aspiration pNA, r/o gastroenteritis, r/o cdiff (group home) P:   Agree Imipenem, vanc Send cdiff if stool returns loose again and add oral vanc, flagyl Sputum if able Ensure BC sent   TODAY'S SUMMARY: Improved with volume, no distress, dc NIMV, if NIMV required again move to ICU, repeat lactic acid Will follow with  you  I have personally obtained a history, examined the patient, evaluated laboratory and imaging results, formulated the assessment and plan and placed orders. CRITICAL CARE: The patient is critically ill with multiple organ systems  failure and requires high complexity decision making for assessment and support, frequent evaluation and titration of therapies, application of advanced monitoring technologies and extensive interpretation of multiple databases. Critical Care Time devoted to patient care services described in this note is 35 minutes.   Mcarthur Rossettianiel J. Tyson AliasFeinstein, MD, FACP Pgr: [phone] [facility]  Pulmonary and Critical Care Medicine [facility] Pager: [phone]  04/07/2013, 5:48 PM

## 2013-04-07 NOTE — Progress Notes (Signed)
Pt taken off BiPAP, pt wanted break. Pt placed on 4LPM . Pt vitals are stable. SPO2 96%. RT will continue to monitor.

## 2013-04-08 ENCOUNTER — Inpatient Hospital Stay (HOSPITAL_COMMUNITY): Payer: Medicare Other

## 2013-04-08 DIAGNOSIS — I4729 Other ventricular tachycardia: Secondary | ICD-10-CM

## 2013-04-08 DIAGNOSIS — J449 Chronic obstructive pulmonary disease, unspecified: Secondary | ICD-10-CM

## 2013-04-08 DIAGNOSIS — E440 Moderate protein-calorie malnutrition: Secondary | ICD-10-CM | POA: Insufficient documentation

## 2013-04-08 DIAGNOSIS — J4489 Other specified chronic obstructive pulmonary disease: Secondary | ICD-10-CM

## 2013-04-08 DIAGNOSIS — I472 Ventricular tachycardia, unspecified: Secondary | ICD-10-CM

## 2013-04-08 DIAGNOSIS — E876 Hypokalemia: Secondary | ICD-10-CM

## 2013-04-08 LAB — COMPREHENSIVE METABOLIC PANEL
ALBUMIN: 2.5 g/dL — AB (ref 3.5–5.2)
ALK PHOS: 59 U/L (ref 39–117)
ALT: 38 U/L (ref 0–53)
AST: 57 U/L — ABNORMAL HIGH (ref 0–37)
BUN: 21 mg/dL (ref 6–23)
CALCIUM: 7.6 mg/dL — AB (ref 8.4–10.5)
CO2: 22 mEq/L (ref 19–32)
Chloride: 103 mEq/L (ref 96–112)
Creatinine, Ser: 0.87 mg/dL (ref 0.50–1.35)
GFR calc non Af Amer: 82 mL/min — ABNORMAL LOW (ref >90)
Glucose, Bld: 66 mg/dL — ABNORMAL LOW (ref 70–99)
POTASSIUM: 3.2 meq/L — AB (ref 3.7–5.3)
SODIUM: 141 meq/L (ref 137–147)
Total Bilirubin: 0.3 mg/dL (ref 0.3–1.2)
Total Protein: 6.1 g/dL (ref 6.0–8.3)

## 2013-04-08 LAB — CBC
HCT: 34.9 % — ABNORMAL LOW (ref 39.0–52.0)
HEMOGLOBIN: 11.7 g/dL — AB (ref 13.0–17.0)
MCH: 30.5 pg (ref 26.0–34.0)
MCHC: 33.5 g/dL (ref 30.0–36.0)
MCV: 91.1 fL (ref 78.0–100.0)
Platelets: 245 10*3/uL (ref 150–400)
RBC: 3.83 MIL/uL — AB (ref 4.22–5.81)
RDW: 16.3 % — ABNORMAL HIGH (ref 11.5–15.5)
WBC: 14.2 10*3/uL — ABNORMAL HIGH (ref 4.0–10.5)

## 2013-04-08 LAB — LEGIONELLA ANTIGEN, URINE: Legionella Antigen, Urine: NEGATIVE

## 2013-04-08 LAB — RESPIRATORY VIRUS PANEL
Adenovirus: NOT DETECTED
INFLUENZA B 1: NOT DETECTED
Influenza A H1: NOT DETECTED
Influenza A H3: NOT DETECTED
Influenza A: NOT DETECTED
Metapneumovirus: NOT DETECTED
PARAINFLUENZA 3 A: NOT DETECTED
Parainfluenza 1: NOT DETECTED
Parainfluenza 2: NOT DETECTED
Respiratory Syncytial Virus A: NOT DETECTED
Respiratory Syncytial Virus B: NOT DETECTED
Rhinovirus: NOT DETECTED

## 2013-04-08 LAB — CBC WITH DIFFERENTIAL/PLATELET
BASOS ABS: 0 10*3/uL (ref 0.0–0.1)
Basophils Relative: 0 % (ref 0–1)
EOS PCT: 0 % (ref 0–5)
Eosinophils Absolute: 0 10*3/uL (ref 0.0–0.7)
HEMATOCRIT: 34.1 % — AB (ref 39.0–52.0)
Hemoglobin: 11.3 g/dL — ABNORMAL LOW (ref 13.0–17.0)
LYMPHS PCT: 8 % — AB (ref 12–46)
Lymphs Abs: 1.2 10*3/uL (ref 0.7–4.0)
MCH: 30.4 pg (ref 26.0–34.0)
MCHC: 33.1 g/dL (ref 30.0–36.0)
MCV: 91.7 fL (ref 78.0–100.0)
MONOS PCT: 8 % (ref 3–12)
Monocytes Absolute: 1.3 10*3/uL — ABNORMAL HIGH (ref 0.1–1.0)
NEUTROS ABS: 12.8 10*3/uL — AB (ref 1.7–7.7)
Neutrophils Relative %: 84 % — ABNORMAL HIGH (ref 43–77)
Platelets: 262 10*3/uL (ref 150–400)
RBC: 3.72 MIL/uL — ABNORMAL LOW (ref 4.22–5.81)
RDW: 16.4 % — AB (ref 11.5–15.5)
WBC: 15.3 10*3/uL — AB (ref 4.0–10.5)

## 2013-04-08 LAB — BASIC METABOLIC PANEL
BUN: 22 mg/dL (ref 6–23)
BUN: 18 mg/dL (ref 6–23)
CHLORIDE: 104 meq/L (ref 96–112)
CO2: 21 meq/L (ref 19–32)
CO2: 21 mEq/L (ref 19–32)
CREATININE: 0.92 mg/dL (ref 0.50–1.35)
Calcium: 7.6 mg/dL — ABNORMAL LOW (ref 8.4–10.5)
Calcium: 7.7 mg/dL — ABNORMAL LOW (ref 8.4–10.5)
Chloride: 102 mEq/L (ref 96–112)
Creatinine, Ser: 0.73 mg/dL (ref 0.50–1.35)
GFR calc Af Amer: >90 mL/min (ref >90)
GFR calc Af Amer: >90 mL/min (ref >90)
GFR calc non Af Amer: 80 mL/min — ABNORMAL LOW (ref >90)
GFR calc non Af Amer: 88 mL/min — ABNORMAL LOW (ref >90)
GLUCOSE: 79 mg/dL (ref 70–99)
Glucose, Bld: 94 mg/dL (ref 70–99)
Potassium: 3.1 mEq/L — ABNORMAL LOW (ref 3.7–5.3)
Potassium: 3.7 mEq/L (ref 3.7–5.3)
SODIUM: 141 meq/L (ref 137–147)
Sodium: 139 mEq/L (ref 137–147)

## 2013-04-08 LAB — PHOSPHORUS: PHOSPHORUS: 2.8 mg/dL (ref 2.3–4.6)

## 2013-04-08 LAB — URINE CULTURE
COLONY COUNT: NO GROWTH
Culture: NO GROWTH

## 2013-04-08 LAB — CLOSTRIDIUM DIFFICILE BY PCR: Toxigenic C. Difficile by PCR: NEGATIVE

## 2013-04-08 LAB — CORTISOL: Cortisol, Plasma: 30.1 ug/dL

## 2013-04-08 LAB — TSH: TSH: 0.984 u[IU]/mL (ref 0.350–4.500)

## 2013-04-08 LAB — MAGNESIUM
Magnesium: 1.5 mg/dL (ref 1.5–2.5)
Magnesium: 2.3 mg/dL (ref 1.5–2.5)

## 2013-04-08 MED ORDER — ENSURE COMPLETE PO LIQD
237.0000 mL | Freq: Two times a day (BID) | ORAL | Status: DC
  Administered 2013-04-09 – 2013-04-14 (×7): 237 mL via ORAL

## 2013-04-08 MED ORDER — POTASSIUM CHLORIDE 10 MEQ/100ML IV SOLN: 10.0000 meq | INTRAVENOUS | Status: DC

## 2013-04-08 MED ORDER — MAGNESIUM SULFATE 40 MG/ML IJ SOLN
2.0000 g | Freq: Once | INTRAMUSCULAR | Status: AC
  Administered 2013-04-08: 2 g via INTRAVENOUS
  Filled 2013-04-08: qty 50

## 2013-04-08 MED ORDER — POTASSIUM CHLORIDE 10 MEQ/100ML IV SOLN
10.0000 meq | INTRAVENOUS | Status: AC
Start: 2013-04-08 — End: 2013-04-08
  Administered 2013-04-08 (×6): 10 meq via INTRAVENOUS
  Filled 2013-04-08 (×6): qty 100

## 2013-04-08 MED ORDER — BUDESONIDE 0.25 MG/2ML IN SUSP
0.2500 mg | Freq: Two times a day (BID) | RESPIRATORY_TRACT | Status: DC
  Administered 2013-04-08 – 2013-04-10 (×4): 0.25 mg via RESPIRATORY_TRACT
  Filled 2013-04-08 (×7): qty 2

## 2013-04-08 MED ORDER — SODIUM CHLORIDE 0.9 % IV SOLN
1000.0000 mL | INTRAVENOUS | Status: DC
  Administered 2013-04-08: 1000 mL via INTRAVENOUS

## 2013-04-08 MED ORDER — LURASIDONE HCL 80 MG PO TABS
160.0000 mg | ORAL_TABLET | Freq: Every day | ORAL | Status: DC
  Administered 2013-04-08 – 2013-04-09 (×2): 160 mg via ORAL
  Filled 2013-04-08 (×4): qty 2

## 2013-04-08 NOTE — Progress Notes (Addendum)
FMTS Attending Note  I personally saw and evaluated the patient. The plan of care was discussed with the resident team. I agree with the assessment and plan as documented by the resident.   1. Acute on Chronic Respiratory Failure - suspect due to HCAP (CXR shows LLL infiltrate and RLL effusion vs scarring), may have component of aspiration pneumonia, agree with Vancomycin and Primaxin, await blood and respiratory cultures, currently on Cassia, off BiPAP, CCM following if acute intubation is needed  2. Mixed Respiratory Acidosis (CO2 retention), Metabolic acidosis (elevated lactic acid), and Metabolic Alkalosis (vomiting) - improved with treatment 3. MR/History of psychosis - continue home meds 4. Diarrhea - C-dif negative, GI panel negative, GI exam non-focal at this time  5. Hypotension - hold home BP meds, s/p 3 L NS in ED, continue IVF, blood pressure improved today  6. COPD - continue home breathing treatments 7. Intermittent Torsades on Telemetry - currently in NSR, s/p magnesium replacement, avoid medications that prolong QT interval  Donnella ShamKyle Raedyn Wenke MD

## 2013-04-08 NOTE — Progress Notes (Signed)
Utilization review completed. Analis Distler, RN, BSN. 

## 2013-04-08 NOTE — Progress Notes (Signed)
Patient ZO:XWRUEA:[name] [name]      DOB: Aug 08, 1934      VWU:[id number]  Consult received to perform goals of care for this patient.  Patient without capcity for decision making awaiting phone number for guardian see LCSW note. Spoke with LCSW.  Will collaborate with team to establish reasonable GOC, may need ethics to assist in this matter.   Will attempt asap to get in touch with guardian once numbers are sent from group home.   Cranston Koors L. Ladona Ridgelaylor, MD MBA The Palliative Medicine Team at [facility] Team Phone: [phone] Pager: [phone]

## 2013-04-08 NOTE — Progress Notes (Signed)
Pt. Had episode of VTach. Pt. Asymptomatic. Elink camera in. Labs ordered. EKG ordered. Notified FMTS of incident. Will continue to monitor.

## 2013-04-08 NOTE — Progress Notes (Signed)
Clinical Social Work Department BRIEF PSYCHOSOCIAL ASSESSMENT 04/08/2013  Patient:  [name],Jaasiel     Account Number:  [id number]     Admit date:  04/07/2013  Clinical Social Worker:  Varney BilesANDERSON,Deavin Forst, LCSWA  Date/Time:  04/08/2013 01:53 PM  Referred by:  Physician  Date Referred:  04/08/2013 Referred for  Other - See comment   Other Referral:   From [facility] group home   Interview type:  Other - See comment Other interview type:   Called [facility] and spoke with administrator, Benjaman LobeJay Coltrane.    PSYCHOSOCIAL DATA Living Status:  FACILITY Admitted from facility:  OTHER Level of care:  Group Home Primary support name:  Benjaman LobeJay Coltrane [phone]) Primary support relationship to patient:  NONE Degree of support available:   Good--pt from group home and has guardian.    CURRENT CONCERNS Current Concerns  Post-Acute Placement   Other Concerns:    SOCIAL WORK ASSESSMENT / PLAN I googled the address listed in pt's chart to find the name of the group home pt lives in--I spoke with administrator, Benjaman LobeJay Coltrane. Vonna KotykJay states pt lives at a group home called "[facility]" at [address]. Administrator states pt has a legal guardian, and he did not have the contact information for guardian in front of him but he will call me back with this and I will add it to pt's chart.   Assessment/plan status:  Psychosocial Support/Ongoing Assessment of Needs Other assessment/ plan:   Information/referral to community resources:   Group home informed of pt's location in hospital and administrator has unit phone number to get disposition updates from pt's RN.    PATIENT'S/FAMILY'S RESPONSE TO PLAN OF CARE: Administrator thanked me for call and information for unit so he can call RN to check on pt. I am waiting for return call from [place] with pt's guardian name and contact info. I will continue to coordinate care with medical team and group home, and provide support to pt/pt's support system while he  is in the hospital.       Maryclare LabradorJulie Alynna Hargrove, MSW, [facility] Clinical Social Worker [phone]

## 2013-04-08 NOTE — Progress Notes (Signed)
INITIAL NUTRITION ASSESSMENT  DOCUMENTATION CODES Per approved criteria  -Non-severe (moderate) malnutrition in the context of chronic illness   INTERVENTION: Ensure Complete po BID, each supplement provides 350 kcal and 13 grams of protein RD to follow for nutrition care plan  NUTRITION DIAGNOSIS: Inadequate oral intake related to poor appetite, acute illness as evidenced PO intake 10%  Goal: Pt to meet >/= 90% of their estimated nutrition needs   Monitor:  PO & supplemental intake, goals of care, weight, labs, I/O's  Reason for Assessment: Malnutrition Screening Tool Report, Low Braden  79 y.o. male  Admitting Dx: Severe sepsis  ASSESSMENT: 79 y/o male with PMH MR, Psychosis, COPD, Asthma, Tobacco abuse, GERD, and HTN presented with severe respiratory distress; concern for aspiration pneumonia; pt is from [facility] group home.  RD unable to obtain nutrition hx.  Pt sleeping upon visit.  Noted pt with nausea & vomiting PTA; s/p bedside swallow evaluation -- SLP recommending Regular, thin liquid diet; PO intake poor at 10% per flowsheet records; per wt readings, pt's wt has been stable since November 2014; + muscle & fat loss to upper & lower body (legs exposed); will order nutrition supplements at this time.  Palliative Care Team consulted for goals of care.  Low braden score places patient at risk for skin breakdown.  Nutrition Focused Physical Exam:  Subcutaneous Fat:  Orbital Region: moderate depletion Upper Arm Region: N/A Thoracic and Lumbar Region: N/A  Muscle:  Temple Region: moderate depletion Clavicle Bone Region: severe depletion Clavicle and Acromion Bone Region: severe depletion  Scapular Bone Region: N/A Dorsal Hand: N/A Patellar Region: N/A Anterior Thigh Region: moderate depletion Posterior Calf Region: moderate depletion  Edema: none  Patient meets criteria for non-severe (moderate) malnutrition in the context of chronic illness as evidenced by  moderate-severe muscle & subcutaneous fat loss.  Height: Ht Readings from Last 1 Encounters:  04/07/13 5\' 11"  (1.803 m)    Weight: Wt Readings from Last 1 Encounters:  04/08/13 141 lb 1.5 oz (64 kg)    Ideal Body Weight: 172 lb  % Ideal Body Weight: 82%  Wt Readings from Last 10 Encounters:  04/08/13 141 lb 1.5 oz (64 kg)  11/26/12 143 lb 8 oz (65.091 kg)  12/12/11 156 lb (70.761 kg)  04/04/11 161 lb (73.029 kg)  01/09/11 161 lb (73.029 kg)  10/26/10 160 lb 8 oz (72.802 kg)  04/04/10 158 lb 8 oz (71.895 kg)  12/01/09 162 lb (73.483 kg)  02/14/09 165 lb 3.2 oz (74.934 kg)  02/07/09 165 lb 12.8 oz (75.206 kg)    Usual Body Weight: 143 lb  % Usual Body Weight: 98%  BMI:  Body mass index is 19.69 kg/(m^2).  Estimated Nutritional Needs: Kcal: 1700-1900 Protein: 80-90 gm Fluid: 1.7-1.9 L  Skin: Intact  Diet Order: General  EDUCATION NEEDS: -No education needs identified at this time   Intake/Output Summary (Last 24 hours) at 04/08/13 1601 Last data filed at 04/08/13 1500  Gross per 24 hour  Intake 3637.92 ml  Output   1350 ml  Net 2287.92 ml    Labs:   Recent Labs Lab 04/07/13 1920 04/08/13 0140 04/08/13 0428 04/08/13 1250  NA 138 141 141 139  K 3.3* 3.1* 3.2* 3.7  CL 100 102 103 104  CO2 20 21 22 21   BUN 23 22 21 18   CREATININE 0.94 0.92 0.87 0.73  CALCIUM 7.8* 7.6* 7.6* 7.7*  MG  --  1.5  --  2.3  PHOS  --  2.8  --   --   GLUCOSE 74 79 66* 94    Scheduled Meds: . budesonide (PULMICORT) nebulizer solution  0.25 mg Nebulization BID  . heparin  5,000 Units Subcutaneous 3 times per day  . hydrocerin   Topical BID  . imipenem-cilastatin  250 mg Intravenous 4 times per day  . lurasidone  160 mg Oral Q supper  . Memantine HCl ER  1 capsule Oral QPC breakfast  . pantoprazole (PROTONIX) IV  40 mg Intravenous QHS  . simvastatin  20 mg Oral QHS  . sucralfate  1 g Oral Q6H  . thioridazine  50 mg Oral BID  . vancomycin  500 mg Intravenous Q12H     Continuous Infusions: . sodium chloride 1,000 mL (04/08/13 1540)    Past Medical History  Diagnosis Date  . COPD (chronic obstructive pulmonary disease)   . History of stomach ulcers   . Retardation   . Anemia   . Hypertension   . GERD (gastroesophageal reflux disease)   . Dementia     Past Surgical History  Procedure Laterality Date  . Gastrectomy      Maureen Chatters, RD, LDN Pager #: [phone] After-Hours Pager #: [phone]

## 2013-04-08 NOTE — Progress Notes (Signed)
Patient ZO:XWRUEA:Remi Duke SalviaLackey      DOB: 08/27/1934      VWU:[id number]  Message left for the patient's guardian. Will schedule Goals of care as soon as possible.  There is an after hours emergency line listed at the guardian's phone number should the primary service need to engage in phone goals with guardians staff or administrators after hours or in an emergency.   Rida Loudin L. Ladona Ridgelaylor, MD MBA The Palliative Medicine Team at [facility] Team Phone: [phone] Pager: [phone]

## 2013-04-08 NOTE — Progress Notes (Signed)
Family Medicine Teaching Service Daily Progress Note Intern Pager: [phone]  Patient name: [name] [name] Medical record number: [id number] Date of birth: [birth year] Age: 79 y.o. Gender: male  Primary Care Provider: Jacquelin Hawking, MD Consultants: CCM Code Status: Full (DSS next of contact)  Pt Overview and Major Events to Date:  3/18: admitted for respiratory distress, on Bipap, Zenaida Niece and Primaxin for HCAP coverage 3/18: Taken off BiPAP by CCM, placed on [place]  3/19: had episodes of torsades, potassium still low, Mg and Phos normal 3/19: improved work of breathing   ABX/Culture Vanc (3/18)>>  Primaxin (3/18)>>  Resp Cx (needs to be collected)>> Urine Cx (3/18)>> Blood cx (3/18)>> Stool cx (3/17)>>  Assessment and Plan: [name] [name] is a 79 y.o. male presenting with respiratory distress secondary to PNA . PMH is significant for MR, COPD/Asthma, tobacco abuse.   #Severe Sepsis secondary to HCAP/Aspiration PNA w/ Acute Respiratory Distress: Patient was taken off BiPAP. He was having some episodes that resembled Torsades overnight. EKG showed NSR. Mg and K were both repleted. Mildly elevated temperature 100.4 overnight. Still maintaining O2 sats in the mid-90's on 4L. Lactic acid is improving. Cortisol is normal. Amylase and LHD are both high but lipase normal. Calcium (8.8) corrects to normal. Leukocytosis could be due to stress demargination and patient is covered with appropriate antibiotics.  - ABX as above  - Continue 4L O2, BiPAP PRN  - NS 125 mL/hr  - Supp for Tylenol for fever  - Duoneb Q6 PRN  - if the patient is in resp. Distress again and considering BiPAP. Then call CCM and they will place a tube instead due to his risk of aspiration.   #Mixed Respiratory Acidosis (CO2 retention), Metabolic acidosis (elevated lactic acid), and Metabolic Alkalosis (vomiting): patient has been weaned off BiPAP and is work of breathing has improved. Still providing [place].  - Still having a gap of 16  this AM.  - receiving fluids as above  -Repeat BMET later today to evaluate AG and Bicarb  -Repeat ABG as needed or if becomes hypoxic   #Hypokelemia: K still low this AM. Secondary to diarrhea. 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 Most likely a viral gastro.  - C. Diff: negative.  - Gi panel: negative  - Holding Laxatives   # Hx of HTN: blood pressures improved with fluids  - Holding home Metoprolol/Dyazide   FEN/GI: 125 cc/hr, NPO pending clinical improvement  Prophylaxis: Heparin SQ  Disposition: pending improvement   Subjective: patient looks much better than yesterday. He is much more responsive and breathing easier.   Objective: Temp:  [98.4 F (36.9 C)-102.7 F (39.3 C)] 99.2 F (37.3 C) (03/19 0337) Pulse Rate:  [85-104] 95 (03/19 0337) Resp:  [16-26] 23 (03/19 0337) BP: (86-137)/(37-104) 91/55 mmHg (03/19 0337) SpO2:  [92 %-100 %] 95 % (03/19 0337) FiO2 (%):  [40 %-80 %] 40 % (03/18 1555) Weight:  [141 lb 1.5 oz (64 kg)] 141 lb 1.5 oz (64 kg) (03/19 0337) Physical Exam: General: NAD, alert, comfortable on 4L [place]   HEENT: [place]/AT, EOMI B/L  Cardiovascular: RRR, no murmurs appreciated  Respiratory: normal WOB, Course breath sounds, crackles heard at bibasilar bases, diffuse wheezing, no accessory muscle use  Abdomen: Soft/NT/ND, NABS, No HSM  Extremities: no pedal edema, + 2 DP/PT B/L  Skin: In tact, dry, warm  Neuro: No focal deficits  Laboratory:  Recent Labs Lab 04/07/13 0810 04/08/13 0140 04/08/13 0428  WBC 13.4* 14.2* 15.3*  HGB 14.5 11.7* 11.3*  HCT 42.9 34.9* 34.1*  PLT 308 245 262    Recent Labs Lab 04/06/13 1514   04/07/13 1920 04/08/13 0140 04/08/13 0428  NA 137  < > 138 141 141  K 3.7  < > 3.3* 3.1* 3.2*  CL 99  < > 100 102 103  CO2 25  < > 20 21 22   BUN 24*  < > 23 22 21   CREATININE 1.06  < > 0.94 0.92 0.87  CALCIUM 8.7  < > 7.8* 7.6* 7.6*  PROT 7.7  --   --   --  6.1  BILITOT 0.3  --   --   --  0.3  ALKPHOS 92  --   --   --  59  ALT 20  --   --   --  38  AST 37  --   --   --  57*  GLUCOSE 85  < > 74 79 66*  < > = values in this interval not displayed.  Mg: 1.5 (nml)  Phos: 2.89 (nml)  Lactic Acid: 2.75>>1.3 (nml)  Cortisol: 30.1 (nml)  TSH: 0.984 (nml)  Amylase: 183 (H) Lipase: 27 (nml)  LDH: 262 (h)  C. Diff: negative  RSV: pending Legionella, urinary: pending  Step pneumo, Ag: Negative  MRSA: positive  HIV: NR  Pro BNP: 415.1 (nml)  GI path panel (3/17)>>negative    Recent Labs Lab 04/07/13 0810  TROPONINI <0.30    Recent Labs Lab 04/07/13 1009  PHART 7.327*  PCO2ART 50.0*  PO2ART 238.0*  HCO3 25.6*  TCO2 27  O2SAT 100.0   Urinalysis    Component Value Date/Time   COLORURINE YELLOW 04/07/2013 0905   APPEARANCEUR CLOUDY* 04/07/2013 0905   LABSPEC 1.021 04/07/2013 0905   PHURINE 5.5 04/07/2013 0905   GLUCOSEU NEGATIVE 04/07/2013 0905   HGBUR NEGATIVE 04/07/2013 0905   BILIRUBINUR NEGATIVE 04/07/2013 0905   KETONESUR NEGATIVE 04/07/2013 0905   PROTEINUR 30* 04/07/2013 0905   UROBILINOGEN 0.2 04/07/2013 0905   NITRITE NEGATIVE 04/07/2013 0905   LEUKOCYTESUR NEGATIVE 04/07/2013 0905    Imaging/Diagnostic Tests: CXR 04/07/13  Increased density predominantly at the left lung base is consistent with acute atelectasis or pneumonia. There are chronic fibrotic changes in both lungs. There is no evidence of pulmonary edema.  CT head 04/07/13:  IMPRESSION:  No acute intracranial pathology.  CXR 04/07/13 IMPRESSION:  Persistent asymmetric left basilar airspace disease suspicious for pneumonia or aspiration.  CXR 04/08/13 1. Similar appearance of patchy left basilar  airspace disease, suspicious for pneumonia.  2. Small right pleural effusion, new relative to prior study. No pulmonary edema.   Myra RudeJeremy E Henry Demeritt, MD 04/08/2013, 8:04 AM PGY-1, [facility] FPTS Intern pager: [phone], text pages welcome

## 2013-04-08 NOTE — Progress Notes (Signed)
PGY-2 Update Note  Paged by RN. Pt had an episode of what appeared on telemetry to be torsades, though pt remained relatively asymptomatic (i.e., at his previous level of consciousness, etc). Per RN, intern pager was paged but she did not get a response. [facility] (Dr[facility] Herma CarsonZ) camera'd in to room and noted previous K of 3.3 and ordered KCl IV x6, BMP, Mg level, phos level, and EKG. EKG showed essentially NSR without significantly prolonged QTc. Labs showed K 3.1, Mg 1.5, phos 2.8.  On exam, pt appears without change. Breathing is actually slightly improved (breath sounds less coarse, respirations down closer to 20) after albuterol treatment a few hours ago. Monitor shows essentially NSR, though as this note was being written, monitor showed a few non-sustained (1-3 beats) of torsades-appearing tracing. Will give magnesium sulfate 2g over 15 minutes and continue to monitor clinically. Pending labs and CXR for the AM have been ordered.  Greatly appreciate CCM assistance.  [name] Mortonhristopher M Darryel Diodato, MD PGY-2, [facility] 04/08/2013, 3:31 AM FPTS Service pager: [phone] (text pages welcome through [facility])

## 2013-04-08 NOTE — Evaluation (Signed)
Clinical/Bedside Swallow Evaluation Patient Details  Name: [name] [name] MRN: [id number] Date of Birth: [birth year]  Today's Date: 04/08/2013 Time: 1191-4782 SLP Time Calculation (min): 16 min  Past Medical History:  Past Medical History  Diagnosis Date  . COPD (chronic obstructive pulmonary disease)   . History of stomach ulcers   . Retardation   . Anemia   . Hypertension   . GERD (gastroesophageal reflux disease)   . Dementia    Past Surgical History:  Past Surgical History  Procedure Laterality Date  . Gastrectomy     HPI:  [name] [name] is a 79 y.o. male presenting with nausea vomiting and respiratory distress found to have aspiration PNA. Pt seen last night in [facility] due to diarrhea, had multiple evaluations performed, and GI panel pending, was dx and sent back to his group home. Patient brought to the ER by ambulance this AM from this group home due to respiratory distress and lethargy. Patient reportedly had nausea, vomiting and diarrhea through the night. This morning he was lethargic and struggling to breathe, EMS was called. They report oxygen saturation 67% on room air upon arrival, was started on NRB and had improvement on his O2. IN ed received aggressive volume and responded to volume. Still being treated with NIMV.    Assessment / Plan / Recommendation Clinical Impression  Pt demonstrates respiratory based dysphagia, resulting from increased work of breath. WIth small sips of liquids, consuming over 6 oz, pt appears WNL, but when allowed to drink large consecutive straw sips pt does have late signs of aspiration, probably due to decreased airway protection over prolonged swallows. Pt also demonstrates ability to masticate solids. Recommend a regular texture diet and thin liquids, no  straws, with full supervision and assist for small sips. Pills should be given whole in puree. Will f/u for tolerance.     Aspiration Risk  Mild    Diet Recommendation Regular;Thin liquid    Liquid Administration via: No straw;Cup Medication Administration: Whole meds with puree Compensations: Slow rate;Small sips/bites Postural Changes and/or Swallow Maneuvers: Seated upright 90 degrees    Other  Recommendations Oral Care Recommendations: Oral care BID   Follow Up Recommendations  None    Frequency and Duration min 2x/week  2 weeks   Pertinent Vitals/Pain NA    SLP Swallow Goals     Swallow Study Prior Functional Status       General HPI: [name] [name] is a 79 y.o. male presenting with nausea vomiting and respiratory distress found to have aspiration PNA. Pt seen last night in [facility] due to diarrhea, had multiple evaluations performed, and GI panel pending, was dx and sent back to his group home. Patient brought to the ER by ambulance this AM from this group home due to respiratory distress and lethargy. Patient reportedly had nausea, vomiting and diarrhea through the night. This morning he was lethargic and struggling to breathe, EMS was called. They report oxygen saturation 67% on room air upon arrival, was started on NRB and had improvement on his O2. IN ed received aggressive volume and responded to volume. Still being treated with NIMV.  Type of Study: Bedside swallow evaluation Diet Prior to this Study: NPO Temperature Spikes Noted: Yes Respiratory Status: Nasal cannula History of Recent Intubation: No Behavior/Cognition: Alert;Cooperative;Confused;Requires cueing Oral Cavity - Dentition: Missing dentition;Poor condition Self-Feeding Abilities: Total assist Patient Positioning: Upright in bed Baseline Vocal Quality: Clear Volitional Cough: Cognitively unable to elicit Volitional Swallow: Unable to elicit    Oral/Motor/Sensory Function Overall  Oral Motor/Sensory Function: Appears within functional limits for tasks assessed (does not follow commands, but appears WNL)   Ice Chips     Thin Liquid Thin Liquid: Impaired Presentation: Cup;Straw Pharyngeal   Phase Impairments: Cough - Delayed (with consecutive straw sips)    Nectar Thick Nectar Thick Liquid: Not tested   Honey Thick Honey Thick Liquid: Not tested   Puree Puree: Within functional limits   Solid   GO    Solid: Within functional limits      [facility] HospitalBonnie Kahlen Boyde, MA CCC-SLP [phone]   Claudine MoutonDeBlois, Bertram Haddix Caroline 04/08/2013,11:28 AM

## 2013-04-08 NOTE — Progress Notes (Signed)
Group home administrator provided name and phone number for legal guardian [name] [name], [phone].   [name] [name], MSW, [facility] Clinical Social Worker [phone]

## 2013-04-08 NOTE — Progress Notes (Signed)
When RT arrived in room, pt off bipap on 4L: [place]. Pt's SPO2 is 96% and vitals WNL.   RT will continue to monitor for bipap use.

## 2013-04-08 NOTE — Progress Notes (Signed)
PULMONARY / CRITICAL CARE MEDICINE   Name: [name] [name] MRN: [id number] DOB: [birth year]    ADMISSION DATE:  04/07/2013 CONSULTATION DATE:  04/07/13  REFERRING MD :  FPTS  CHIEF COMPLAINT:  resp distress, PNA, sepsis  BRIEF PATIENT DESCRIPTION:  79 yo male smoker with n/v and respiratory distress with concern for aspiration pneumonia.  Has hx of COPD, mental retardation, psychosis.  PCCM consulted to assess for sepsis.    SIGNIFICANT EVENTS: 3/18 Admit  STUDIES:  3/18 CT head >> no acute findings  LINES / TUBES: PIV  CULTURE: Respiratory viral panel 3/18 >> C diff PCR 3/18 >> negative Blood 3/18 >>  Urine 3/18 >> negative  ANTIBIOTICS: Primaxin 3/18 >> Vancomycin 3/18 >>   SUBJECTIVE:  Breathing better.  Wants something to drink.  VITAL SIGNS: Temp:  [98.4 F (36.9 C)-101.5 F (38.6 C)] 99.2 F (37.3 C) (03/19 0337) Pulse Rate:  [85-104] 93 (03/19 0900) Resp:  [16-25] 17 (03/19 0900) BP: (91-137)/(37-104) 104/59 mmHg (03/19 0900) SpO2:  [92 %-100 %] 96 % (03/19 0900) FiO2 (%):  [40 %] 40 % (03/18 1555) Weight:  [141 lb 1.5 oz (64 kg)] 141 lb 1.5 oz (64 kg) (03/19 0337) INTAKE / OUTPUT: Intake/Output     03/18 0701 - 03/19 0700 03/19 0701 - 03/20 0700   I.V. (mL/kg) 1847.9 (28.9)    IV Piggyback 800 300   Total Intake(mL/kg) 2647.9 (41.4) 300 (4.7)   Urine (mL/kg/hr) 900    Total Output 900     Net +1747.9 +300          PHYSICAL EXAMINATION: General: no distress Neuro: follows simple commands, tremulous jaw, moves all extremities HEENT: poor dentition Cardiovascular: regular, tachycardic Lungs: basilar rales Abdomen: soft, non tender Musculoskeletal:  No edema Skin:  No rash  LABS:  CBC  Recent Labs Lab 04/07/13 0810 04/08/13 0140 04/08/13 0428  WBC 13.4* 14.2* 15.3*  HGB 14.5 11.7* 11.3*  HCT 42.9 34.9* 34.1*  PLT 308 245 262   BMET  Recent Labs Lab 04/07/13 1920 04/08/13 0140 04/08/13 0428  NA 138 141 141  K 3.3* 3.1* 3.2*   CL 100 102 103  CO2 20 21 22   BUN 23 22 21   CREATININE 0.94 0.92 0.87  GLUCOSE 74 79 66*   Electrolytes  Recent Labs Lab 04/07/13 1920 04/08/13 0140 04/08/13 0428  CALCIUM 7.8* 7.6* 7.6*  MG  --  1.5  --   PHOS  --  2.8  --    Sepsis Markers  Recent Labs Lab 04/07/13 0823 04/07/13 1920  LATICACIDVEN 2.75* 1.3   ABG  Recent Labs Lab 04/07/13 1009  PHART 7.327*  PCO2ART 50.0*  PO2ART 238.0*   Liver Enzymes  Recent Labs Lab 04/06/13 1514 04/08/13 0428  AST 37 57*  ALT 20 38  ALKPHOS 92 59  BILITOT 0.3 0.3  ALBUMIN 3.5 2.5*   Cardiac Enzymes  Recent Labs Lab 04/07/13 0810  TROPONINI <0.30  PROBNP 415.1   Imaging Ct Head Wo Contrast  04/07/2013   CLINICAL DATA:  Hit head on the door.  Nausea and vomiting  EXAM: CT HEAD WITHOUT CONTRAST  TECHNIQUE: Contiguous axial images were obtained from the base of the skull through the vertex without intravenous contrast.  COMPARISON:  CT HEAD W/O CM dated 02/01/2009  FINDINGS: There is no evidence of mass effect, midline shift, or extra-axial fluid collections. There is no evidence of a space-occupying lesion or intracranial hemorrhage. There is no evidence of a cortical-based area of  acute infarction. There is generalized cerebral atrophy. There is periventricular white matter low attenuation likely secondary to microangiopathy.  The ventricles and sulci are appropriate for the patient's age. The basal cisterns are patent.  Visualized portions of the orbits are unremarkable. The visualized portions of the paranasal sinuses and mastoid air cells are unremarkable. Cerebrovascular atherosclerotic calcifications are noted.  The osseous structures are unremarkable.  IMPRESSION: No acute intracranial pathology.   Electronically Signed   By: Elige KoHetal  Patel   On: 04/07/2013 11:10   Dg Chest Port 1 View  04/08/2013   CLINICAL DATA:  Evaluate infiltrate  EXAM: PORTABLE CHEST - 1 VIEW  COMPARISON:  Prior radiograph from 04/07/2013.   FINDINGS: Cardiac and mediastinal silhouettes are stable in size and contour, and remain within normal limits.  Current examination has been performed with a similar degree of lung inflation. Underlying chronic lung disease with accentuation of the interstitial markings again noted. Confluent patchy airspace opacity within the left lung base again seen, overall similar as compared to prior study. Again, this is suspicious for superimposed pneumonia. Streaky the linear opacities at the right lung base likely reflect atelectasis and/or scarring. A small right pleural effusion is now seen. No pulmonary edema. No pneumothorax.  Surgical clip overlies the left upper quadrant. No acute osseus abnormality.  IMPRESSION: 1. Similar appearance of patchy left basilar airspace disease, suspicious for pneumonia. 2. Small right pleural effusion, new relative to prior study. No pulmonary edema.   Electronically Signed   By: Rise MuBenjamin  McClintock M.D.   On: 04/08/2013 06:07   Dg Chest Port 1 View  04/07/2013   CLINICAL DATA:  Assess infiltrate. History of hypertension and COPD.  EXAM: PORTABLE CHEST - 1 VIEW  COMPARISON:  DG CHEST 1V PORT dated 04/07/2013; DG CHEST 2 VIEW dated 12/12/2011; DG PNEUMONIA CHEST 2V dated 10/11/2010  FINDINGS: 1843 hr. The heart size and mediastinal contours are stable. There is chronic lung disease with diffuse accentuation of the interstitial markings, especially at the lung bases. As noted on the study performed earlier today, there is increased left basilar airspace disease suspicious for superimposed pneumonia. No significant pleural effusion is identified.  IMPRESSION: Persistent asymmetric left basilar airspace disease suspicious for pneumonia or aspiration.   Electronically Signed   By: Roxy HorsemanBill  Veazey M.D.   On: 04/07/2013 20:41   Dg Chest Port 1 View  04/07/2013   CLINICAL DATA:  Dyspnea  EXAM: PORTABLE CHEST - 1 VIEW  COMPARISON:  DG CHEST 2 VIEW dated 12/12/2011  FINDINGS: The lungs are  mildly hyperinflated. The interstitial markings are increased in the left mid and lower lung as compared to the previous study. There is stable scarring at both lung bases. The cardiac silhouette is not enlarged. The pulmonary vascularity is not engorged. The mediastinum is normal in width. There is a trace of blunting of the left lateral costophrenic angle. The observed portions of the bony thorax exhibit no acute abnormalities.  IMPRESSION: Increased density predominantly at the left lung base is consistent with acute atelectasis or pneumonia. There are chronic fibrotic changes in both lungs. There is no evidence of pulmonary edema.   Electronically Signed   By: David  SwazilandJordan   On: 04/07/2013 08:45   ASSESSMENT / PLAN:  A: Acute respiratory failure 2nd to probable aspiration PNA vs HCAP. Small Rt pleural effusion. Hx of COPD. P: Oxygen to keep SpO2 > 92% F/u CXR intermittently Abx per primary team No longer needing BiPAP >> continue to monitor respiratory status  Speech to assess swallowing Continue BD's  Goals of care >> Uncertain about social situation/medical decision maker.  Defer to primary team.  PCCM will sign off.  Please call if additional help needed.  Coralyn Helling, MD [facility] Pulmonary/Critical Care 04/08/2013, 11:25 AM Pager:  [phone] After 3pm call: [phone]

## 2013-04-09 LAB — CBC
HEMATOCRIT: 35.2 % — AB (ref 39.0–52.0)
HEMOGLOBIN: 11.7 g/dL — AB (ref 13.0–17.0)
MCH: 30.6 pg (ref 26.0–34.0)
MCHC: 33.2 g/dL (ref 30.0–36.0)
MCV: 92.1 fL (ref 78.0–100.0)
Platelets: 247 10*3/uL (ref 150–400)
RBC: 3.82 MIL/uL — ABNORMAL LOW (ref 4.22–5.81)
RDW: 16.9 % — AB (ref 11.5–15.5)
WBC: 15.4 10*3/uL — ABNORMAL HIGH (ref 4.0–10.5)

## 2013-04-09 LAB — GLUCOSE, CAPILLARY: GLUCOSE-CAPILLARY: 76 mg/dL (ref 70–99)

## 2013-04-09 MED ORDER — CHLORHEXIDINE GLUCONATE CLOTH 2 % EX PADS
6.0000 | MEDICATED_PAD | Freq: Every day | CUTANEOUS | Status: AC
  Administered 2013-04-10 – 2013-04-14 (×4): 6 via TOPICAL

## 2013-04-09 MED ORDER — RESOURCE THICKENUP CLEAR PO POWD
ORAL | Status: DC | PRN
  Filled 2013-04-09 (×2): qty 125

## 2013-04-09 MED ORDER — PANTOPRAZOLE SODIUM 40 MG PO TBEC
40.0000 mg | DELAYED_RELEASE_TABLET | Freq: Every day | ORAL | Status: DC
  Administered 2013-04-09: 40 mg via ORAL
  Filled 2013-04-09: qty 1

## 2013-04-09 MED ORDER — MUPIROCIN 2 % EX OINT
1.0000 | TOPICAL_OINTMENT | Freq: Two times a day (BID) | CUTANEOUS | Status: AC
Start: 2013-04-09 — End: 2013-04-14
  Administered 2013-04-09 – 2013-04-14 (×10): 1 via NASAL
  Filled 2013-04-09 (×2): qty 22

## 2013-04-09 MED ORDER — SODIUM CHLORIDE 0.9 % IV SOLN
500.0000 mg | Freq: Three times a day (TID) | INTRAVENOUS | Status: DC
  Administered 2013-04-09 – 2013-04-13 (×12): 500 mg via INTRAVENOUS
  Filled 2013-04-09 (×15): qty 500

## 2013-04-09 NOTE — Progress Notes (Signed)
Family Medicine Teaching Service Daily Progress Note Intern Pager: [phone]  Patient name: [name] [name] Medical record number: [id number] Date of birth: [birth year] Age: 79 y.o. Gender: male  Primary Care Provider: Jacquelin Hawking, MD Consultants: CCM Code Status: Full (DSS next of contact)  Pt Overview and Major Events to Date:  3/18: admitted for respiratory distress, on Bipap, Zenaida Niece and Primaxin for HCAP coverage 3/18: Taken off BiPAP by CCM, placed on [place]  3/19: had episodes of torsades, potassium still low, Mg and Phos normal 3/19: improved work of breathing  3/20: palliative care meeting   ABX/Culture Vanc (3/18)>>  Primaxin (3/18)>>  Resp Cx (needs to be collected)>> Urine Cx (3/18)>>No growth  Blood cx (3/18)>>NGTD Stool cx (3/17)>>  Assessment and Plan: [name] [name] is a 79 y.o. male presenting with respiratory distress secondary to PNA . PMH is significant for MR, COPD/Asthma, tobacco abuse.   #Severe Sepsis secondary to HCAP/Aspiration PNA w/ Acute Respiratory Distress: patient febrile (101.2) overnight.  - patient will have palliative care meeting to discuss goals of care. - f/u CBC   - Continue 3L [place] O2  - NS 125 mL/hr  - Supp for Tylenol for fever  - Duoneb Q6 PRN  - if the patient is in resp. Distress again and considering BiPAP. Then call CCM and they will place a tube instead due to his risk of aspiration.   #Mixed Respiratory Acidosis (CO2 retention), Metabolic acidosis (elevated lactic acid), and Metabolic Alkalosis (vomiting):  improved. Still providing [place].  - Still having a gap of 14 this AM.  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  - Continue home medications including Latuda, Thioridazine,   #Diarrhea: improving. Most likely a viral gastro.  - C. Diff: negative.  - Gi panel: negative  - Holding Laxatives   # Hx of HTN: blood pressures improved with fluids  - Holding home Metoprolol/Dyazide   FEN/GI: 125 cc/hr, NPO pending clinical improvement  Prophylaxis: Heparin SQ  Disposition: pending improvement   Subjective: Patient is receiving a breathing treatment. He had a fever yesterday but improved with tylenol.   Objective: Temp:  [98.9 F (37.2 C)-101.2 F (38.4 C)] 99.4 F (37.4 C) (03/20 0750) Pulse Rate:  [92-113] 107 (03/20 0750) Resp:  [17-27] 27 (03/20 0750) BP: (104-143)/(43-72) 136/71 mmHg (03/20 0750) SpO2:  [91 %-100 %] 91 % (03/20 0750) Weight:  [141 lb 1.5 oz (64 kg)] 141 lb 1.5 oz (64 kg) (03/20 0347) Physical Exam: General: NAD, alert, comfortable on 3L [place]   HEENT: [place]/AT, EOMI B/L  Cardiovascular: RRR, no murmurs appreciated  Respiratory: normal WOB, Course breath sounds, crackles heard at bibasilar bases, diffuse wheezing, no accessory muscle use  Abdomen: Soft/NT/ND, NABS, No HSM  Extremities: no pedal edema, + 2 DP/PT B/L  Skin: In tact, dry, warm  Neuro: No focal deficits  Laboratory:  Recent Labs Lab 04/07/13 0810 04/08/13 0140 04/08/13 0428  WBC 13.4* 14.2* 15.3*  HGB 14.5 11.7* 11.3*  HCT 42.9 34.9* 34.1*  PLT 308 245 262    Recent Labs Lab 04/06/13 1514  04/08/13 0140 04/08/13 0428 04/08/13 1250  NA 137  < >  141 141 139  K 3.7  < > 3.1* 3.2* 3.7  CL 99  < > 102 103 104  CO2 25  < > 21 22 21   BUN 24*  < > 22 21 18   CREATININE 1.06  < > 0.92 0.87 0.73  CALCIUM 8.7  < > 7.6* 7.6* 7.7*  PROT 7.7  --   --  6.1  --   BILITOT 0.3  --   --  0.3  --   ALKPHOS 92  --   --  59  --   ALT 20  --   --  38  --   AST 37  --   --  57*  --   GLUCOSE 85  < > 79 66* 94  < > = values in this interval not displayed.  Mg: 1.5 (nml)  Phos: 2.89 (nml)  Lactic  Acid: 2.75>>1.3 (nml)  Cortisol: 30.1 (nml)  TSH: 0.984 (nml)  Amylase: 183 (H) Lipase: 27 (nml)  LDH: 262 (h)  C. Diff: negative  RSV: pending Legionella, urinary: pending  Step pneumo, Ag: Negative  MRSA: positive  HIV: NR  Pro BNP: 415.1 (nml)  GI path panel (3/17)>>negative     Recent Labs Lab 04/07/13 0810  TROPONINI <0.30    Recent Labs Lab 04/07/13 1009  PHART 7.327*  PCO2ART 50.0*  PO2ART 238.0*  HCO3 25.6*  TCO2 27  O2SAT 100.0   Urinalysis    Component Value Date/Time   COLORURINE YELLOW 04/07/2013 0905   APPEARANCEUR CLOUDY* 04/07/2013 0905   LABSPEC 1.021 04/07/2013 0905   PHURINE 5.5 04/07/2013 0905   GLUCOSEU NEGATIVE 04/07/2013 0905   HGBUR NEGATIVE 04/07/2013 0905   BILIRUBINUR NEGATIVE 04/07/2013 0905   KETONESUR NEGATIVE 04/07/2013 0905   PROTEINUR 30* 04/07/2013 0905   UROBILINOGEN 0.2 04/07/2013 0905   NITRITE NEGATIVE 04/07/2013 0905   LEUKOCYTESUR NEGATIVE 04/07/2013 0905    Imaging/Diagnostic Tests: CXR 04/07/13  Increased density predominantly at the left lung base is consistent with acute atelectasis or pneumonia. There are chronic fibrotic changes in both lungs. There is no evidence of pulmonary edema.  CT head 04/07/13:  IMPRESSION:  No acute intracranial pathology.  CXR 04/07/13 IMPRESSION:  Persistent asymmetric left basilar airspace disease suspicious for pneumonia or aspiration.  CXR 04/08/13 1. Similar appearance of patchy left basilar airspace disease, suspicious for pneumonia.  2. Small right pleural effusion, new relative to prior study. No pulmonary edema.   Myra RudeJeremy E Tashanna Dolin, MD 04/09/2013, 8:10 AM PGY-1, [facility] FPTS Intern pager: [phone], text pages welcome

## 2013-04-09 NOTE — Progress Notes (Signed)
FMTS ATTENDING  NOTE Camrynn Mcclintic,MD I  have seen and examined this patient, reviewed their chart. I have discussed this patient with the resident. I agree with the resident's findings, assessment and care plan. 

## 2013-04-09 NOTE — Progress Notes (Signed)
ANTIBIOTIC CONSULT NOTE - FOLLOW UP  Pharmacy Consult for vancomycin and Primaxin Indication: sepsis due to HCAP, aspiration PNA  Allergies  Allergen Reactions  . Penicillins     Unsure of reaction    Patient Measurements: Height: 5\' 11"  (180.3 cm) Weight: 141 lb 1.5 oz (64 kg) IBW/kg (Calculated) : 75.3  Vital Signs: Temp: 99.4 F (37.4 C) (03/20 0750) Temp src: Core (Comment) (03/20 0750) BP: 136/71 mmHg (03/20 0750) Pulse Rate: 107 (03/20 0750) Intake/Output from previous day: 03/19 0701 - 03/20 0700 In: 2590 [P.O.:240; I.V.:1550; IV Piggyback:800] Out: 1100 [Urine:1100] Intake/Output from this shift: Total I/O In: -  Out: 400 [Urine:400]  Labs:  Recent Labs  04/07/13 0810  04/08/13 0140 04/08/13 0428 04/08/13 1250  WBC 13.4*  --  14.2* 15.3*  --   HGB 14.5  --  11.7* 11.3*  --   PLT 308  --  245 262  --   CREATININE 1.17  < > 0.92 0.87 0.73  < > = values in this interval not displayed. Estimated Creatinine Clearance: 71.1 ml/min (by C-G formula based on Cr of 0.73). No results found for this basename: VANCOTROUGH, VANCOPEAK, VANCORANDOM, GENTTROUGH, GENTPEAK, GENTRANDOM, TOBRATROUGH, TOBRAPEAK, TOBRARND, AMIKACINPEAK, AMIKACINTROU, AMIKACIN,  in the last 72 hours   Microbiology: Recent Results (from the past 720 hour(s))  CLOSTRIDIUM DIFFICILE BY PCR     Status: None   Collection Time    04/06/13  7:47 PM      Result Value Ref Range Status   C difficile by pcr NEGATIVE  NEGATIVE Final   Comment: Performed at [facility]  CULTURE, BLOOD (ROUTINE X 2)     Status: None   Collection Time    04/07/13  8:10 AM      Result Value Ref Range Status   Specimen Description BLOOD RIGHT ANTECUBITAL   Final   Special Requests BOTTLES DRAWN AEROBIC AND ANAEROBIC 5CCS   Final   Culture  Setup Time     Final   Value: 04/07/2013 13:50     Performed at 04/09/2013   Culture     Final   Value:        BLOOD CULTURE RECEIVED NO GROWTH TO DATE CULTURE  WILL BE HELD FOR 5 DAYS BEFORE ISSUING A FINAL NEGATIVE REPORT     Performed at [facility]   Report Status PENDING   Incomplete  CULTURE, BLOOD (ROUTINE X 2)     Status: None   Collection Time    04/07/13  8:50 AM      Result Value Ref Range Status   Specimen Description BLOOD LEFT ANTECUBITAL   Final   Special Requests BOTTLES DRAWN AEROBIC ONLY 3CC   Final   Culture  Setup Time     Final   Value: 04/07/2013 14:32     Performed at 04/09/2013   Culture     Final   Value:        BLOOD CULTURE RECEIVED NO GROWTH TO DATE CULTURE WILL BE HELD FOR 5 DAYS BEFORE ISSUING A FINAL NEGATIVE REPORT     Performed at [facility]   Report Status PENDING   Incomplete  URINE CULTURE     Status: None   Collection Time    04/07/13  9:05 AM      Result Value Ref Range Status   Specimen Description URINE, CATHETERIZED   Final   Special Requests NONE   Final   Culture  Setup  Time     Final   Value: 04/07/2013 09:59     Performed at [facility]     Final   Value: NO GROWTH     Performed at [facility]   Culture     Final   Value: NO GROWTH     Performed at [facility]   Report Status 04/08/2013 FINAL   Final  MRSA PCR SCREENING     Status: Abnormal   Collection Time    04/07/13  4:10 PM      Result Value Ref Range Status   MRSA by PCR POSITIVE (*) NEGATIVE Final   Comment:            The GeneXpert MRSA Assay (FDA     approved for NASAL specimens     only), is one component of a     comprehensive MRSA colonization     surveillance program. It is not     intended to diagnose MRSA     infection nor to guide or     monitor treatment for     MRSA infections.     RESULT CALLED TO, READ BACK BY AND VERIFIED WITH:     CARBONE RN 17:55 04/07/13 ([email or web address])  CLOSTRIDIUM DIFFICILE BY PCR     Status: None   Collection Time    04/07/13  7:10 PM      Result Value Ref Range Status   C difficile by pcr NEGATIVE  NEGATIVE Final   RESPIRATORY VIRUS PANEL     Status: None   Collection Time    04/07/13  7:10 PM      Result Value Ref Range Status   Source - RVPAN NASAL SWAB   Corrected   Comment: CORRECTED ON 03/19 AT 1848: PREVIOUSLY REPORTED AS NASAL SWAB   Respiratory Syncytial Virus A NOT DETECTED   Final   Respiratory Syncytial Virus B NOT DETECTED   Final   Influenza A NOT DETECTED   Final   Influenza B NOT DETECTED   Final   Parainfluenza 1 NOT DETECTED   Final   Parainfluenza 2 NOT DETECTED   Final   Parainfluenza 3 NOT DETECTED   Final   Metapneumovirus NOT DETECTED   Final   Rhinovirus NOT DETECTED   Final   Adenovirus NOT DETECTED   Final   Influenza A H1 NOT DETECTED   Final   Influenza A H3 NOT DETECTED   Final   Comment: (NOTE)           Normal Reference Range for each Analyte: NOT DETECTED     Testing performed using the Luminex xTAG Respiratory Viral Panel test     kit.     This test was developed and its performance characteristics determined     by [facility]. It has not been cleared or approved by the [place]     Food and Drug Administration. This test is used for clinical purposes.     It should not be regarded as investigational or for research. This     laboratory is certified under the [place] (CLIA) as qualified to perform high complexity     clinical laboratory testing.     Performed at [place]   Start     Dose/Rate Route Frequency Ordered Stop   04/09/13 1200  imipenem-cilastatin (PRIMAXIN) 500 mg in sodium chloride 0.9 %  100 mL IVPB     500 mg 200 mL/hr over 30 Minutes Intravenous Every 8 hours 04/09/13 0809     04/07/13 2100  vancomycin (VANCOCIN) 500 mg in sodium chloride 0.9 % 100 mL IVPB  Status:  Discontinued     500 mg 100 mL/hr over 60 Minutes Intravenous Every 12 hours 04/07/13 0910 04/07/13 1347   04/07/13 2100  vancomycin (VANCOCIN) 500 mg in sodium chloride 0.9 % 100 mL IVPB     500  mg 100 mL/hr over 60 Minutes Intravenous Every 12 hours 04/07/13 1420     04/07/13 1500  aztreonam (AZACTAM) 1 g in dextrose 5 % 50 mL IVPB  Status:  Discontinued     1 g 100 mL/hr over 30 Minutes Intravenous 3 times per day 04/07/13 0910 04/07/13 1347   04/07/13 1430  imipenem-cilastatin (PRIMAXIN) 250 mg in sodium chloride 0.9 % 100 mL IVPB  Status:  Discontinued     250 mg 200 mL/hr over 30 Minutes Intravenous 4 times per day 04/07/13 1424 04/09/13 0809   04/07/13 0900  aztreonam (AZACTAM) 2 g in dextrose 5 % 50 mL IVPB     2 g 100 mL/hr over 30 Minutes Intravenous  Once 04/07/13 0849 04/07/13 0940   04/07/13 0900  vancomycin (VANCOCIN) IVPB 1000 mg/200 mL premix     1,000 mg 200 mL/hr over 60 Minutes Intravenous  Once 04/07/13 0849 04/07/13 1053      Assessment: 79 y/o male admitted from group home with N/V/D and lethargy, low O2 sats and is currently on day 3 of vancomycin and Primaxin for sepsis due to HCAP, aspiration PNA. Patient is still febrile with Tm 101.2 F last night, cultures are negative thus far, and renal function is normal. WBC remain elevated.  Goal of Therapy:  Vancomycin trough level 15-20 mcg/ml Resolution of infection  Plan:  - Continue vancomycin 500 mg IV q12h - Change Primaxin to 500 mg IV q8h - Vancomycin trough as clinically indicated - Monitor renal function, clinical progress, and culture data  [facility], [place].D., BCPS Clinical Pharmacist Pager: [phone] 04/09/2013 9:20 AM

## 2013-04-09 NOTE — Progress Notes (Signed)
Speech Language Pathology Treatment: Dysphagia  Patient Details Name: [name] [name] MRN: [id number] DOB: 1934/02/28 Today's Date: 04/09/2013 Time: [id number] SLP Time Calculation (min): 16 min  Assessment / Plan / Recommendation Clinical Impression  Pt with wet vocal quality upon arrival, SLP suctioned pharynx with moderate return. Pt is still very receptive to PO intake, states he wasn't bacon and eggs. RN expressed concern regarding giving solids due to rhonchus breathing. Pt is able to masticate soft solids though transit is slow, swallow response is adequate. Low risk of aspiration of soldis. Pt consumed single cup sips of thin with one cough. Will downgrade diet to be more conservative and facilitate intake. Dys 2/nectar, no straws. Discussed with RN.    HPI HPI: [name] PentaDonald Cerda is a 79 y.o. male presenting with nausea vomiting and respiratory distress found to have aspiration PNA. Pt seen last night in [facility] due to diarrhea, had multiple evaluations performed, and GI panel pending, was dx and sent back to his group home. Patient brought to the ER by ambulance this AM from this group home due to respiratory distress and lethargy. Patient reportedly had nausea, vomiting and diarrhea through the night. This morning he was lethargic and struggling to breathe, EMS was called. They report oxygen saturation 67% on room air upon arrival, was started on NRB and had improvement on his O2. IN ed received aggressive volume and responded to volume. Still being treated with NIMV.    Pertinent Vitals NA  SLP Plan  Continue with current plan of care    Recommendations Diet recommendations: Dysphagia 2 (fine chop);Nectar-thick liquid Liquids provided via: Cup;No straw Medication Administration: Whole meds with puree Supervision: Full supervision/cueing for compensatory strategies;Staff to assist with self feeding Compensations: Slow rate;Small sips/bites Postural Changes and/or Swallow Maneuvers: Seated  upright 90 degrees              Oral Care Recommendations: Oral care BID Follow up Recommendations: Skilled Nursing facility Plan: Continue with current plan of care    GO    [facility] Center[facility] Myishia Kasik, MA CCC-SLP [phone]  Claudine MoutonDeBlois, Dio Giller Caroline 04/09/2013, 11:44 AM

## 2013-04-09 NOTE — Progress Notes (Signed)
Updated FL2 on [facility] in the event group home needs this and left a handoff for the weekend CSW.    Maryclare LabradorJulie Arinze Rivadeneira, MSW, [facility] Hospital[facility] Clinical Social Worker [phone]

## 2013-04-09 NOTE — Progress Notes (Signed)
Patient HY:QMVHQI:[name] [name]      DOB: [birth year]      ONG:[id number]  Social Worker from [facility] Jill Heugel returned my call regarding time to meet.  Patient's brother and care givers at the group home would like to meet on Monday 3/23 at 11 am.  Awaiting call back from Supervising Guardian regarding Code status.  Brother is in favor of DNR/DNI but he is NOT the guardian.   Pixie Burgener L. Ladona Ridgelaylor, MD MBA The Palliative Medicine Team at [facility] Team Phone: [phone] Pager: [phone]

## 2013-04-10 ENCOUNTER — Inpatient Hospital Stay (HOSPITAL_COMMUNITY): Payer: Medicare Other

## 2013-04-10 DIAGNOSIS — E440 Moderate protein-calorie malnutrition: Secondary | ICD-10-CM

## 2013-04-10 DIAGNOSIS — J45909 Unspecified asthma, uncomplicated: Secondary | ICD-10-CM

## 2013-04-10 LAB — BASIC METABOLIC PANEL
BUN: 10 mg/dL (ref 6–23)
BUN: 12 mg/dL (ref 6–23)
CO2: 24 meq/L (ref 19–32)
CO2: 22 mEq/L (ref 19–32)
CREATININE: 0.65 mg/dL (ref 0.50–1.35)
Calcium: 8 mg/dL — ABNORMAL LOW (ref 8.4–10.5)
Calcium: 8 mg/dL — ABNORMAL LOW (ref 8.4–10.5)
Chloride: 105 mEq/L (ref 96–112)
Chloride: 103 mEq/L (ref 96–112)
Creatinine, Ser: 0.65 mg/dL (ref 0.50–1.35)
GFR calc Af Amer: >90 mL/min (ref >90)
GFR calc Af Amer: >90 mL/min (ref >90)
GFR calc non Af Amer: >90 mL/min (ref >90)
GLUCOSE: 92 mg/dL (ref 70–99)
GLUCOSE: 131 mg/dL — AB (ref 70–99)
POTASSIUM: 3.4 meq/L — AB (ref 3.7–5.3)
Potassium: 3.1 mEq/L — ABNORMAL LOW (ref 3.7–5.3)
SODIUM: 144 meq/L (ref 137–147)
Sodium: 144 mEq/L (ref 137–147)

## 2013-04-10 LAB — CBC
HCT: 32.4 % — ABNORMAL LOW (ref 39.0–52.0)
Hemoglobin: 10.9 g/dL — ABNORMAL LOW (ref 13.0–17.0)
MCH: 30.7 pg (ref 26.0–34.0)
MCHC: 33.6 g/dL (ref 30.0–36.0)
MCV: 91.3 fL (ref 78.0–100.0)
Platelets: 239 10*3/uL (ref 150–400)
RBC: 3.55 MIL/uL — AB (ref 4.22–5.81)
RDW: 16.9 % — ABNORMAL HIGH (ref 11.5–15.5)
WBC: 13.4 10*3/uL — ABNORMAL HIGH (ref 4.0–10.5)

## 2013-04-10 LAB — MAGNESIUM: Magnesium: 1.8 mg/dL (ref 1.5–2.5)

## 2013-04-10 MED ORDER — SODIUM CHLORIDE 0.9 % IV SOLN
1000.0000 mL | INTRAVENOUS | Status: DC
  Administered 2013-04-10: 1000 mL via INTRAVENOUS

## 2013-04-10 MED ORDER — METOPROLOL TARTRATE 1 MG/ML IV SOLN
INTRAVENOUS | Status: AC
  Filled 2013-04-10: qty 5

## 2013-04-10 MED ORDER — METOPROLOL TARTRATE 1 MG/ML IV SOLN
5.0000 mg | Freq: Four times a day (QID) | INTRAVENOUS | Status: DC
  Administered 2013-04-10: 5 mg via INTRAVENOUS

## 2013-04-10 MED ORDER — METOPROLOL TARTRATE 1 MG/ML IV SOLN
5.0000 mg | Freq: Once | INTRAVENOUS | Status: AC
  Administered 2013-04-10: 5 mg via INTRAVENOUS

## 2013-04-10 MED ORDER — METHYLPREDNISOLONE SODIUM SUCC 40 MG IJ SOLR
40.0000 mg | Freq: Two times a day (BID) | INTRAMUSCULAR | Status: DC
Start: 2013-04-10 — End: 2013-04-14
  Administered 2013-04-10 – 2013-04-14 (×8): 40 mg via INTRAVENOUS
  Filled 2013-04-10 (×10): qty 1

## 2013-04-10 MED ORDER — FUROSEMIDE 10 MG/ML IJ SOLN
40.0000 mg | Freq: Three times a day (TID) | INTRAMUSCULAR | Status: AC
  Administered 2013-04-10 – 2013-04-11 (×2): 40 mg via INTRAVENOUS
  Filled 2013-04-10 (×2): qty 4

## 2013-04-10 MED ORDER — LEVALBUTEROL HCL 0.63 MG/3ML IN NEBU
0.6300 mg | INHALATION_SOLUTION | Freq: Four times a day (QID) | RESPIRATORY_TRACT | Status: DC
  Administered 2013-04-10 – 2013-04-14 (×16): 0.63 mg via RESPIRATORY_TRACT
  Filled 2013-04-10 (×32): qty 3

## 2013-04-10 MED ORDER — SODIUM CHLORIDE 0.9 % IV BOLUS (SEPSIS)
500.0000 mL | Freq: Once | INTRAVENOUS | Status: AC
  Administered 2013-04-10: 500 mL via INTRAVENOUS

## 2013-04-10 MED ORDER — METOPROLOL TARTRATE 1 MG/ML IV SOLN
INTRAVENOUS | Status: AC
  Administered 2013-04-10: 5 mg
  Filled 2013-04-10: qty 5

## 2013-04-10 MED ORDER — IPRATROPIUM BROMIDE 0.02 % IN SOLN
0.5000 mg | Freq: Four times a day (QID) | RESPIRATORY_TRACT | Status: DC
  Administered 2013-04-10 – 2013-04-14 (×17): 0.5 mg via RESPIRATORY_TRACT
  Filled 2013-04-10 (×16): qty 2.5

## 2013-04-10 MED ORDER — POTASSIUM CHLORIDE 10 MEQ/100ML IV SOLN
10.0000 meq | INTRAVENOUS | Status: AC
  Administered 2013-04-10 (×5): 10 meq via INTRAVENOUS
  Filled 2013-04-10 (×4): qty 100

## 2013-04-10 MED ORDER — IPRATROPIUM BROMIDE 0.02 % IN SOLN
RESPIRATORY_TRACT | Status: AC
  Filled 2013-04-10: qty 2.5

## 2013-04-10 NOTE — Progress Notes (Signed)
Family Medicine Teaching Service Daily Progress Note Intern Pager: [phone]  Patient name: [name] [name] Medical record number: [id number] Date of birth: 02-Jan-1935 Age: 79 y.o. Gender: male  Primary Care Provider: Jacquelin HawkingNettey, Ralph, MD Consultants: CCM Code Status: Full (DSS next of contact)  Pt Overview and Major Events to Date:  3/18: admitted for respiratory distress, on Bipap, Zenaida NieceVan and Primaxin for HCAP coverage 3/18: Taken off BiPAP by CCM, placed on [place]  3/19: had episodes of torsades, potassium still low, Mg and Phos normal 3/19: improved work of breathing  3/20: palliative care meeting set for Monday 3/23 3/21: No major changes  ABX/Culture Vanc (3/18)>>  Primaxin (3/18)>>  Resp Cx (needs to be collected)>> Urine Cx (3/18)>>No growth  Blood cx (3/18)>>NGTD Stool cx (3/17)>> Rersp swab (3/19)>>negative MRSA Screen Positive  Assessment and Plan: [name] PentaDonald Plemons is a 79 y.o. male presenting with respiratory distress secondary to suspected aspiration PNA . PMH is significant for MR, COPD/Asthma, tobacco abuse.   #Severe Sepsis secondary to HCAP/Aspiration PNA w/ Acute Respiratory Distress: Improving, patient with near fever (100.1) overnight.  - patient will have palliative care meeting to discuss goals of care Monday  - WBC trending down, 13.2   - Continue vanc and primaxin for now, consider de-escalating to levaquin tomorrow vs Monday pending clinical picture - Continue 4L [place] O2 - WOB moderate - NS 125 mL/hr  - Supp for Tylenol for fever  - Duoneb Q6 PRN  - question of airway protection agreed with by CCM so will need intubation if he needs more resp support again, no BiPAP  #Mixed Respiratory Acidosis (CO2 retention), Metabolic acidosis (elevated lactic acid), and Metabolic Alkalosis (vomiting):  improved. Still providing [place].  - Still having a gap of 15 this AM.  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  - Continue home medications including Latuda, Thioridazine,  - complicates eval of his MS as baseline is unclear, per nursing he spoketo family at bedside yesterday  #Diarrhea: improving. Most likely a viral gastro.  - C. Diff: negative.  - Gi panel: negative  - Holding Laxatives   # Hx of HTN: blood pressures improved with fluids  - Holding home Metoprolol/Dyazide   #Tachycardia - reviewed tele, some tachypnea, PVCs with sinus tach overnight - Continue fluids, consider BB but would hesitate given sepsis/SIRS syndrome  FEN/GI: 125 cc/hr, dys 2 diet Prophylaxis: Heparin SQ  Disposition: continue step down considering risk of more resp support and waxing/waning MS  Subjective: per nursing patient talked to family yesterday, was very difficult to arouse responding only to pain this am.   Objective: Temp:  [99 F (37.2 C)-100.1 F (37.8 C)] 99 F (37.2 C) (03/21 0355) Pulse Rate:  [84-125] 99 (03/21 0743) Resp:  [17-31] 21 (03/21 0743) BP: (117-158)/(61-81) 147/74 mmHg (03/21 0743) SpO2:  [89 %-99 %] 90 % (03/21 0811) Physical Exam: General: NAD, alert, mild to mod inc WOB on 4L [place], responsive but non-verbal HEENT: abrasion on mid forehead Cardiovascular:tachy but regular rhythm, difficult to hear over loud coarse breathing Respiratory: mild to mod inc WOB, Course breath sounds, on 4L o2 via [place]  Abdomen: Soft/NT/ND, + BS Extremities: no pedal edema, + 2 DP pulses Skin: In tact, dry, warm  Neuro: No focal deficits  Laboratory:  Recent Labs Lab 04/08/13 0428 04/09/13 1135 04/10/13 0302  WBC 15.3* 15.4* 13.4*  HGB 11.3* 11.7* 10.9*  HCT 34.1* 35.2* 32.4*  PLT 262 247  239    Recent Labs Lab 04/06/13 1514  04/08/13 0428 04/08/13 1250 04/10/13 0302  NA 137  < > 141 139 144  K 3.7  < > 3.2* 3.7 3.1*  CL 99  < > 103 104 105  CO2 25  < > 22 21 24   BUN 24*  < > 21 18 10   CREATININE 1.06  < > 0.87 0.73 0.65  CALCIUM 8.7  < > 7.6* 7.7* 8.0*  PROT 7.7  --  6.1  --   --   BILITOT 0.3  --  0.3  --   --   ALKPHOS 92  --  59  --   --   ALT 20  --  38  --   --   AST 37  --  57*  --   --   GLUCOSE 85  < > 66* 94 92  < > = values in this interval not displayed.  Mg: 1.5 (nml)  Phos: 2.89 (nml)  Lactic Acid: 2.75>>1.3 (nml)  Cortisol: 30.1 (nml)  TSH: 0.984 (nml)  Amylase: 183 (H) Lipase: 27 (nml)  LDH: 262 (h)  C. Diff: negative  RSV: pending Legionella, urinary: pending  Step pneumo, Ag: Negative  MRSA: positive  HIV: NR  Pro BNP: 415.1 (nml)  GI path panel (3/17)>>negative     Recent Labs Lab 04/07/13 0810  TROPONINI <0.30    Recent Labs Lab 04/07/13 1009  PHART 7.327*  PCO2ART 50.0*  PO2ART 238.0*  HCO3 25.6*  TCO2 27  O2SAT 100.0   Urinalysis    Component Value Date/Time   COLORURINE YELLOW 04/07/2013 0905   APPEARANCEUR CLOUDY* 04/07/2013 0905   LABSPEC 1.021 04/07/2013 0905   PHURINE 5.5 04/07/2013 0905   GLUCOSEU NEGATIVE 04/07/2013 0905   HGBUR NEGATIVE 04/07/2013 0905   BILIRUBINUR NEGATIVE 04/07/2013 0905   KETONESUR NEGATIVE 04/07/2013 0905   PROTEINUR 30* 04/07/2013 0905   UROBILINOGEN 0.2 04/07/2013 0905   NITRITE NEGATIVE 04/07/2013 0905   LEUKOCYTESUR NEGATIVE 04/07/2013 0905    Imaging/Diagnostic Tests: CXR 04/07/13  Increased density predominantly at the left lung base is consistent with acute atelectasis or pneumonia. There are chronic fibrotic changes in both lungs. There is no evidence of pulmonary edema.  CT head 04/07/13:  IMPRESSION:  No acute intracranial pathology.  CXR 04/07/13 IMPRESSION:  Persistent asymmetric left basilar airspace disease suspicious for pneumonia or aspiration.  CXR 04/08/13 1.  Similar appearance of patchy left basilar airspace disease, suspicious for pneumonia.  2. Small right pleural effusion, new relative to prior study. No pulmonary edema.   Elenora Gamma, MD 04/10/2013, 8:32 AM PGY-2, [facility] FPTS Intern pager: [phone], text pages welcome

## 2013-04-10 NOTE — Progress Notes (Signed)
FMTS Attending Note  I personally saw and evaluated the patient. The plan of care was discussed with the resident team. I agree with the assessment and plan as documented by the resident.   1. Acute on Chronic Respiratory Failure - suspect due to HCAP/aspiration PNA (CXR shows LLL infiltrate and RLL effusion vs scarring), agree with continued Vancomycin and Primaxin as patient has spiked low grade fevers, blood and respiratory cultures negative, currently on [place], patient had episode of emesis this AM and [place] was increased to 6 L, SpO2 97% therefore will have nurse wean O2 as tolerated 2. Mixed Respiratory Acidosis (CO2 retention), Metabolic acidosis (elevated lactic acid), and Metabolic Alkalosis (vomiting) - improved with treatment  3. MR/History of psychosis - continue home meds  4. Diarrhea - C-dif negative, GI panel negative, GI exam non-focal at this time, improved 5. Hypotension - patient is normotensive, continue to hold home BP medications 6. COPD - continue home breathing treatments  7. Intermittent Torsades on Telemetry - currently in NSR (intermittently tachycardic), avoid medications that prolong QT interval, if tachycardia persists would consider low dose BB  Donnella ShamKyle Fletke MD

## 2013-04-10 NOTE — Progress Notes (Addendum)
Nurse contacted MD to inform of shift assessment findings; pt is difficult to arouse, use of accessory muscle, pt is rhonchus throughout with gurgling sounds heard without asculatation, RR ranging in 20's.  Respiratory at bedside.  MD acknowledged nurse concerns; ordered nasal suctioning and informed nurse team would assess pt shortly.  Nurse will continue to monitor.

## 2013-04-10 NOTE — Progress Notes (Signed)
Nurse was called into pt room by tech; pt vomited entire am meal, pt oxygen saturations decreased into high 80's, nurse suctioned pt mouth and increased supplemental oxygen to 6L.  Pt is resting comfortably, nurse will continue to monitor.  MD made aware of event; nurse to continue administration of oral mediciatons, however diet should be stopped until further notice.

## 2013-04-10 NOTE — Progress Notes (Addendum)
Interval note  S: Called to bedside by RN for sustained heart rate in the 130s to 40s. The patient is alert and is interactive as he was at my exam this morning. He is minimally interactive, but does track with his eyes. He had a vomiting episode this morning and has been suctioned with nasal suctioning. The nurse states that his heart rate increased after being suctioned.  After review of his telemetry he had a sudden abrupt increase in heart rate to 140s and has been sustained since then, making me suspicious of SVT. On telemetry and auscultation his heart rate sounds regular rhythm and tachycardic.  O: General: NAD, alert, mild to mod inc WOB on 4L [place], responsive but non-verbal  HEENT: abrasion on mid forehead  Cardiovascular:tachy but regular rhythm, difficult to hear over loud coarse breathing  Respiratory: mild to mod inc WOB, Course breath sounds, on 5L o2 via   Extremities: no pedal edema, + 2 DP pulses  Neuro: No focal deficits, minimally interactive, tracks with eyes   A/P: 79 year old male here with sepsis due to HCAP/aspiration PNA now with sudden abrupt increase in heart rate consistent with SVT. His blood pressure is reasonable and I think will tolerate metoprolol this time.  We'll go ahead and treat with 5 mg IV metoprolol and monitor closely.  Murtis SinkSam Daysie Helf, MD [facility] Resident, PGY-2 04/10/2013, 12:48 PM     Addendum  Called again for persistent tachycardia. He has fluctuating mental status and is now only responsive to pain. His lung exam remains the same and he is satting 94% on 5 L. With his fever, tachycardia, and worsening mental status I am concerned he has aspirated again and may end up needing more resp support. I have contacted CCM and asked them to consult for recommendations and in case he needs intubation later today as he is not a good BiPAP candidate.   Given tylenol for fever and 500 mL bolus. I appreciate CCMs recommendations.   Will also get repeat portable CXR  Murtis SinkSam Joseh Sjogren, MD [facility] Resident, PGY-2 04/10/2013, 3:05 PM

## 2013-04-10 NOTE — Progress Notes (Signed)
PULMONARY / CRITICAL CARE MEDICINE   Name: [name] [name] MRN: [id number] DOB: 07/04/1934    ADMISSION DATE:  04/07/2013 CONSULTATION DATE:  04/07/13  REFERRING MD :  FPTS  CHIEF COMPLAINT:  resp distress, PNA, sepsis  BRIEF PATIENT DESCRIPTION:  79 yo male smoker with n/v and respiratory distress with concern for aspiration pneumonia.  Has hx of COPD, mental retardation, psychosis.  PCCM reconsulted 3/21 after signing off 3/19 to assess for resp distress  SIGNIFICANT EVENTS: 3/18 Admit  STUDIES:  3/18 CT head >> no acute findings  LINES / TUBES: PIV  CULTURE: Respiratory viral panel 3/18 >>neg C diff PCR 3/18 >> negative Blood 3/18 >> neg Urine 3/18 >> negative  ANTIBIOTICS: Primaxin 3/18 >> Vancomycin 3/18 >>   SUBJECTIVE:  Mild resp distress.  I>>>O  HR elevated  VITAL SIGNS: Temp:  [99 F (37.2 C)-100.2 F (37.9 C)] 100.2 F (37.9 C) (03/21 1307) Pulse Rate:  [84-143] 133 (03/21 1400) Resp:  [17-31] 22 (03/21 1400) BP: (115-158)/(63-81) 115/63 mmHg (03/21 1400) SpO2:  [88 %-97 %] 91 % (03/21 1307) INTAKE / OUTPUT: Intake/Output     03/20 0701 - 03/21 0700 03/21 0701 - 03/22 0700   P.O. 120    I.V. (mL/kg) 750 (11.7) 300 (4.7)   IV Piggyback 500    Total Intake(mL/kg) 1370 (21.4) 300 (4.7)   Urine (mL/kg/hr) 1310 (0.9) 400 (0.8)   Total Output 1310 400   Net +60 -100          PHYSICAL EXAMINATION: General: no distress Neuro: follows simple commands, tremulous jaw, moves all extremities HEENT: poor dentition Cardiovascular: regular, tachycardic Lungs:scattered rhonchi Abdomen: soft, non tender Musculoskeletal:  No edema Skin:  No rash  LABS:  CBC  Recent Labs Lab 04/08/13 0428 04/09/13 1135 04/10/13 0302  WBC 15.3* 15.4* 13.4*  HGB 11.3* 11.7* 10.9*  HCT 34.1* 35.2* 32.4*  PLT 262 247 239   BMET  Recent Labs Lab 04/08/13 0428 04/08/13 1250 04/10/13 0302  NA 141 139 144  K 3.2* 3.7 3.1*  CL 103 104 105  CO2 22 21 24   BUN 21  18 10   CREATININE 0.87 0.73 0.65  GLUCOSE 66* 94 92   Electrolytes  Recent Labs Lab 04/08/13 0140 04/08/13 0428 04/08/13 1250 04/10/13 0302  CALCIUM 7.6* 7.6* 7.7* 8.0*  MG 1.5  --  2.3  --   PHOS 2.8  --   --   --    Sepsis Markers  Recent Labs Lab 04/07/13 0823 04/07/13 1920  LATICACIDVEN 2.75* 1.3   ABG  Recent Labs Lab 04/07/13 1009  PHART 7.327*  PCO2ART 50.0*  PO2ART 238.0*   Liver Enzymes  Recent Labs Lab 04/06/13 1514 04/08/13 0428  AST 37 57*  ALT 20 38  ALKPHOS 92 59  BILITOT 0.3 0.3  ALBUMIN 3.5 2.5*   Cardiac Enzymes  Recent Labs Lab 04/07/13 0810  TROPONINI <0.30  PROBNP 415.1   Imaging 3/19 CXR: pulm edema. LLL INfiltrate   ASSESSMENT / PLAN:  A: Acute respiratory failure 2nd to probable aspiration PNA vs HCAP. Volume excess, not getting BDs on schedule. Small Rt pleural effusion. Hx of COPD. P: BDs on schedule and intensifiy IV lasix,  Not IVF Reduce IVF to kvo CXR chk IV medrol Cont ABX See orders Keep 2C  For now  Caryl Bis  [phone]  Cell  [phone]  If no response or cell goes to voicemail, call beeper [phone]  [facility] 04/10/2013, 3:02 PM Beeper  [phone]  Cell  [phone]  If no response or cell goes to voicemail, call beeper [phone]

## 2013-04-10 NOTE — Progress Notes (Signed)
Interval note  S: Called by RN for bradycardia to 30s after he converted from his atrial arrythmia. He is alert and smiling, non verbal unchanged from previous exam.   O:  Gen: NAD, alert CV: irregular, brady, good S1/S2, no murmur Resp: loud course breath sounds/rhonchi BL, non labored, 6 L [place]  A/P 79 y.o male with MR admitted for aspiration PNa with possible re-aspiration event this am now with bradycardia after being in tachyarrhythmia all day. WIll draw stat BMP and mag, replace if needed. PLace pacing pads and get atropine at bedside. Will monitor overnight and consult cards in the am for tachy/brady syndrome if necessary.   Murtis SinkSam Kendal Raffo, MD [facility] Resident, PGY-2 04/10/2013, 10:56 PM

## 2013-04-11 DIAGNOSIS — J9600 Acute respiratory failure, unspecified whether with hypoxia or hypercapnia: Secondary | ICD-10-CM

## 2013-04-11 DIAGNOSIS — I4892 Unspecified atrial flutter: Secondary | ICD-10-CM

## 2013-04-11 DIAGNOSIS — Z87891 Personal history of nicotine dependence: Secondary | ICD-10-CM

## 2013-04-11 DIAGNOSIS — R000 Tachycardia, unspecified: Secondary | ICD-10-CM

## 2013-04-11 LAB — BASIC METABOLIC PANEL
BUN: 16 mg/dL (ref 6–23)
BUN: 23 mg/dL (ref 6–23)
CALCIUM: 8.3 mg/dL — AB (ref 8.4–10.5)
CO2: 22 mEq/L (ref 19–32)
CO2: 24 meq/L (ref 19–32)
CREATININE: 0.68 mg/dL (ref 0.50–1.35)
Calcium: 8.6 mg/dL (ref 8.4–10.5)
Chloride: 100 mEq/L (ref 96–112)
Chloride: 103 mEq/L (ref 96–112)
Creatinine, Ser: 0.65 mg/dL (ref 0.50–1.35)
GFR calc Af Amer: >90 mL/min (ref >90)
GFR calc non Af Amer: >90 mL/min (ref >90)
GLUCOSE: 123 mg/dL — AB (ref 70–99)
GLUCOSE: 175 mg/dL — AB (ref 70–99)
POTASSIUM: 3.6 meq/L — AB (ref 3.7–5.3)
Potassium: 3.2 mEq/L — ABNORMAL LOW (ref 3.7–5.3)
SODIUM: 143 meq/L (ref 137–147)
Sodium: 142 mEq/L (ref 137–147)

## 2013-04-11 LAB — CBC
HCT: 33.9 % — ABNORMAL LOW (ref 39.0–52.0)
Hemoglobin: 11.5 g/dL — ABNORMAL LOW (ref 13.0–17.0)
MCH: 30.4 pg (ref 26.0–34.0)
MCHC: 33.9 g/dL (ref 30.0–36.0)
MCV: 89.7 fL (ref 78.0–100.0)
Platelets: 259 10*3/uL (ref 150–400)
RBC: 3.78 MIL/uL — ABNORMAL LOW (ref 4.22–5.81)
RDW: 17 % — AB (ref 11.5–15.5)
WBC: 8.9 10*3/uL (ref 4.0–10.5)

## 2013-04-11 MED ORDER — POTASSIUM CHLORIDE 10 MEQ/100ML IV SOLN
10.0000 meq | Freq: Once | INTRAVENOUS | Status: AC
  Administered 2013-04-11: 10 meq via INTRAVENOUS
  Filled 2013-04-11: qty 100

## 2013-04-11 MED ORDER — POTASSIUM CHLORIDE 10 MEQ/100ML IV SOLN
10.0000 meq | INTRAVENOUS | Status: AC
  Administered 2013-04-11 (×5): 10 meq via INTRAVENOUS

## 2013-04-11 MED ORDER — PANTOPRAZOLE SODIUM 40 MG IV SOLR
40.0000 mg | Freq: Every day | INTRAVENOUS | Status: DC
  Administered 2013-04-11 – 2013-04-13 (×4): 40 mg via INTRAVENOUS
  Filled 2013-04-11 (×4): qty 40

## 2013-04-11 MED ORDER — POTASSIUM CHLORIDE 10 MEQ/100ML IV SOLN
10.0000 meq | INTRAVENOUS | Status: AC
  Administered 2013-04-11 (×2): 10 meq via INTRAVENOUS
  Filled 2013-04-11 (×2): qty 100

## 2013-04-11 MED ORDER — AMIODARONE LOAD VIA INFUSION
150.0000 mg | Freq: Once | INTRAVENOUS | Status: AC
  Administered 2013-04-11: 150 mg via INTRAVENOUS
  Filled 2013-04-11: qty 83.34

## 2013-04-11 MED ORDER — AMIODARONE HCL IN DEXTROSE 360-4.14 MG/200ML-% IV SOLN
60.0000 mg/h | INTRAVENOUS | Status: AC
  Administered 2013-04-11: 60 mg/h via INTRAVENOUS
  Filled 2013-04-11 (×2): qty 200

## 2013-04-11 MED ORDER — AMIODARONE HCL IN DEXTROSE 360-4.14 MG/200ML-% IV SOLN
30.0000 mg/h | INTRAVENOUS | Status: DC
  Administered 2013-04-11 – 2013-04-12 (×3): 30 mg/h via INTRAVENOUS
  Filled 2013-04-11 (×9): qty 200

## 2013-04-11 NOTE — Progress Notes (Signed)
Family Medicine Teaching Service Daily Progress Note Intern Pager: [phone]  Patient name: [name] [name] Medical record number: [id number] Date of birth: [birth year] Age: 79 y.o. Gender: male  Primary Care Provider: Jacquelin Hawking, MD Consultants: CCM Code Status: Full (DSS next of contact)  Pt Overview and Major Events to Date:  3/18: admitted for respiratory distress, on Bipap, [name] [name] and Primaxin for HCAP coverage 3/18: Taken off BiPAP by CCM, placed on [name] [name]  3/19: had episodes of torsades, potassium still low, Mg and Phos normal 3/19: improved work of breathing  3/20: palliative care meeting set for Monday 3/23 3/21: Atrial tachyarrythmia, overnight tachy/brady to 30s   ABX/Culture Vanc (3/18)>>  Primaxin (3/18)>>  Resp Cx (needs to be collected)>> Urine Cx (3/18)>>No growth  Blood cx (3/18)>>NGTD Stool cx (3/17)>> Rersp swab (3/19)>>negative MRSA Screen Positive  Assessment and Plan: [name] [name] is a 79 y.o. male presenting with respiratory distress secondary to suspected aspiration PNA . PMH is significant for MR, COPD/Asthma, tobacco abuse.   #Severe Sepsis secondary to HCAP/Aspiration PNA w/ Acute Respiratory Distress: Improving,  - CCM re-consulted 3/21- appreciate recommendations  - Pt seems wet- KVO fluids, Lasix (40 IV X 2)  - now negative 3.3 L since yesterday - patient will have palliative care meeting to discuss goals of care Monday  - WBC trending down, 8.9   - Continue vanc and primaxin for now, consider de-escalating to levaquin tomorrow  - Continue 6L [place] O2 - WOB moderate - KVO IVF, seems wet per CCM - Supp for Tylenol for fever  - Duoneb Q6 PRN  - question of airway protection agreed with by CCM so will need intubation if he needs more resp support again, no BiPAP - Speech Re-eval for swallow function  #Tachycardia- Atrial flutter with possible SSS - reviewed tele atrial tach throught the day, tachy brady with brady to the 30s overnight - Schedule  metoprolol 2.5 mg q 6 hrs for tachycardia if needed today - Consider cards eval if tachy/brady persists  #Mixed Respiratory Acidosis (CO2 retention), Metabolic acidosis (elevated lactic acid), and Metabolic Alkalosis (vomiting):  improved. Still providing [place].  - Still having a gap of 20 this AM.  - receiving fluids as above    #Hypokelemia:  - replace, normal renal function   - Had arrythmias possibly associated with hypoK earlier in admission  #Hx of COPD and Asthma - Most likely cause of his Chronic Respiratory Acidosis  -Continue home meds - pulmocort and PRN duoneb  #MR/Psychiatry Mood Disorder: may discontinue meds on discharge as he may be over medicated and contributing to his aspiration.  - Holding oral meds now due to being NPO for concern of aspiration riskontinue home medications including Latuda, Thioridazine,  - complicates eval of his MS as baseline is unclear, per nursing he spoketo family at bedside yesterday  #Diarrhea: resolved . Most likely a viral gastro.  - C. Diff: negative.  - Gi panel: negative  - Holding Laxatives   # Hx of HTN: blood pressures improved  - Holding home Metoprolol/Dyazide   FEN/GI: KVO fluids, NPO Prophylaxis: Heparin SQ  Disposition: continue step down considering risk of more resp support and waxing/waning MS  Subjective:  Complaining of L arm pain at IV site, tachycardia and bradycardia overnight.   Objective: Temp:  [98.6 F (37 C)-100.2 F (37.9 C)] 98.9 F (37.2 C) (03/22 0457) Pulse Rate:  [46-143] 108 (03/22 0457) Resp:  [16-25] 23 (03/22 0457) BP: (102-140)/(55-79) 115/79 mmHg (03/22 0457) SpO2:  [88 %-98 %]  98 % (03/22 0745) Weight:  [141 lb 8.6 oz (64.2 kg)] 141 lb 8.6 oz (64.2 kg) (03/22 0457) Physical Exam: General: NAD, alert, mild to mod inc WOB on 6L [place], responsive with words difficult to understand HEENT: abrasion on mid forehead Cardiovascular: tachy but regular rhythm, difficult to hear over loud coarse  breathing Respiratory: no inc WOB, Course breath sounds, on 6L o2 via [place] Abdomen: Soft/NT/ND, + BS Extremities: no pedal edema, + 2 DP pulses Skin: In tact, dry, warm  Neuro: No focal deficits  Laboratory:  Recent Labs Lab 04/09/13 1135 04/10/13 0302 04/11/13 0430  WBC 15.4* 13.4* 8.9  HGB 11.7* 10.9* 11.5*  HCT 35.2* 32.4* 33.9*  PLT 247 239 259    Recent Labs Lab 04/06/13 1514  04/08/13 0428  04/10/13 0302 04/10/13 2223 04/11/13 0430  NA 137  < > 141  < > 144 144 142  K 3.7  < > 3.2*  < > 3.1* 3.4* 3.2*  CL 99  < > 103  < > 105 103 100  CO2 25  < > 22  < > 24 22 22   BUN 24*  < > 21  < > 10 12 16   CREATININE 1.06  < > 0.87  < > 0.65 0.65 0.68  CALCIUM 8.7  < > 7.6*  < > 8.0* 8.0* 8.3*  PROT 7.7  --  6.1  --   --   --   --   BILITOT 0.3  --  0.3  --   --   --   --   ALKPHOS 92  --  59  --   --   --   --   ALT 20  --  38  --   --   --   --   AST 37  --  57*  --   --   --   --   GLUCOSE 85  < > 66*  < > 92 131* 123*  < > = values in this interval not displayed.  Mg: 1.5 (nml)  Phos: 2.89 (nml)  Lactic Acid: 2.75>>1.3 (nml)  Cortisol: 30.1 (nml)  TSH: 0.984 (nml)  Amylase: 183 (H) Lipase: 27 (nml)  LDH: 262 (h)  C. Diff: negative  RSV: pending Legionella, urinary: pending  Step pneumo, Ag: Negative  MRSA: positive  HIV: NR  Pro BNP: 415.1 (nml)  GI path panel (3/17)>>negative     Recent Labs Lab 04/07/13 0810  TROPONINI <0.30    Recent Labs Lab 04/07/13 1009  PHART 7.327*  PCO2ART 50.0*  PO2ART 238.0*  HCO3 25.6*  TCO2 27  O2SAT 100.0   Urinalysis    Component Value Date/Time   COLORURINE YELLOW 04/07/2013 0905   APPEARANCEUR CLOUDY* 04/07/2013 0905   LABSPEC 1.021 04/07/2013 0905   PHURINE 5.5 04/07/2013 0905   GLUCOSEU NEGATIVE 04/07/2013 0905   HGBUR NEGATIVE 04/07/2013 0905   BILIRUBINUR NEGATIVE 04/07/2013 0905   KETONESUR NEGATIVE 04/07/2013 0905   PROTEINUR 30* 04/07/2013 0905   UROBILINOGEN 0.2 04/07/2013 0905   NITRITE NEGATIVE  04/07/2013 0905   LEUKOCYTESUR NEGATIVE 04/07/2013 0905    Imaging/Diagnostic Tests: CXR 04/07/13  Increased density predominantly at the left lung base is consistent with acute atelectasis or pneumonia. There are chronic fibrotic changes in both lungs. There is no evidence of pulmonary edema.  CT head 04/07/13:  IMPRESSION:  No acute intracranial pathology.  CXR 04/07/13 IMPRESSION:  Persistent asymmetric left basilar airspace disease suspicious for pneumonia or aspiration.  CXR  04/08/13 1. Similar appearance of patchy left basilar airspace disease, suspicious for pneumonia.  2. Small right pleural effusion, new relative to prior study. No pulmonary edema.  CXR 3/22 IMPRESSION:  Multifocal patchy opacities, suspicious for pneumonia, increased.  Suspected small left pleural effusion.   Elenora Gamma, MD 04/11/2013, 8:22 AM PGY-2, [facility] FPTS Intern pager: [phone], text pages welcome

## 2013-04-11 NOTE — Progress Notes (Addendum)
Interval progress note  Called cardiology consult given patient's continued atrial fibrillation with intermittent bradycardia to 30s. Dr Gala RomneyBensimhon to see today and recommended starting amiodarone bolus followed by infusion for short time period (while hospitalized, not to be discharged on this). Ordered amio IV bolus of 150mg  followed by 60mg /hr x 6 hr infusion followed by 30mg /hr infusion, after confirming with Dr Gala RomneyBensimhon. Ordered with monitoring parameters via amiodarone order set.  Leona SingletonMaria T Collette Pescador, MD  * Addendum - Discontinuing thioridizine due to possible interaction with amiodarone (QT prolongation).  Will check EKG in AM.  Leona SingletonMaria T Timmia Cogburn, MD

## 2013-04-11 NOTE — Progress Notes (Signed)
Pt's heart rate continued to sustain in the 120-130s with the pt apparently going in and out of afib and st. At 2159 pt converted to nsr with frequent pac, rare pjc and occasional pvc.  Heart rate then became erratic, sometimes falling as low as 37, with frequent pauses, the longest of which was 2.89 seconds. 12 lead EKG was obtained. Pt was unable to answer questions, but did not appear to be in any pain or distress. MD notified of same, he advised to place pacer pads on pt and stat labs were ordered. Awaiting results. Will continue to monitor.

## 2013-04-11 NOTE — Progress Notes (Signed)
Pt continues to go in and out of afib/flutter to sb/nsr. Pt appears to be comfortable and asymptomatic. AM labs resulted and even after 2 runs of K were given, K on labs dropped from 3.4 to 3.2.  Run 3 of 3 was started and MD was notified of above. Pt is NSR 79 with frequent pacs. Will continue to monitor.

## 2013-04-11 NOTE — Progress Notes (Signed)
FMTS Attending Note  I personally saw and evaluated the patient. The plan of care was discussed with the resident team. I agree with the assessment and plan as documented by the resident.   1. Acute on Chronic Respiratory Failure - suspect due to HCAP/aspiration PNA (CXR shows LLL infiltrate and RLL effusion vs scarring), agree with continued Vancomycin and Primaxin as patient has spiked low grade fevers, blood and respiratory cultures negative, currently on [place], speech consult ordered to evaluate swallow function  2. Mixed Respiratory Acidosis (CO2 retention), Metabolic acidosis (elevated lactic acid), and Metabolic Alkalosis (vomiting) - improved with treatment  3. MR/History of psychosis - continue home meds (resume Latuda once tolerating PO better and no longer having emesis) 4. Diarrhea - C-dif negative, GI panel negative, GI exam non-focal at this time, improved  5. Hypotension - patient is normotensive, continue to hold home BP medications  6. COPD - continue home breathing treatments  7. Intermittent Torsades on Telemetry - currently in NSR (intermittently tachy-brady), consult Cardiology for further evaluation of tachy-brady (?sick sinus syndrome).  Donnella ShamKyle Osmany Azer MD

## 2013-04-11 NOTE — Progress Notes (Signed)
PULMONARY / CRITICAL CARE MEDICINE   Name: [name] [name] MRN: [id number] DOB: 1934/03/31    ADMISSION DATE:  04/07/2013 CONSULTATION DATE:  04/07/13  REFERRING MD :  FPTS  CHIEF COMPLAINT:  resp distress, PNA, sepsis  BRIEF PATIENT DESCRIPTION:  79 yo male smoker with n/v and respiratory distress with concern for aspiration pneumonia.  Has hx of COPD, mental retardation, psychosis.  PCCM reconsulted 3/21 after signing off 3/19 to assess for resp distress  SIGNIFICANT EVENTS: 3/18 Admit  STUDIES:  3/18 CT head >> no acute findings  LINES / TUBES: PIV  CULTURE: Respiratory viral panel 3/18 >>neg C diff PCR 3/18 >> negative Blood 3/18 >> neg Urine 3/18 >> negative  ANTIBIOTICS: Primaxin 3/18 >> Vancomycin 3/18 >>   SUBJECTIVE:  Mild resp distress.  I>>>O  HR elevated  VITAL SIGNS: Temp:  [98.6 F (37 C)-100.2 F (37.9 C)] 98.6 F (37 C) (03/22 0836) Pulse Rate:  [46-143] 76 (03/22 0836) Resp:  [12-25] 12 (03/22 0836) BP: (102-138)/(55-79) 125/55 mmHg (03/22 0836) SpO2:  [89 %-98 %] 92 % (03/22 0836) Weight:  [64.2 kg (141 lb 8.6 oz)] 64.2 kg (141 lb 8.6 oz) (03/22 0457) INTAKE / OUTPUT: Intake/Output     03/21 0701 - 03/22 0700 03/22 0701 - 03/23 0700   P.O.     I.V. (mL/kg) 595 (9.3)    IV Piggyback 200    Total Intake(mL/kg) 795 (12.4)    Urine (mL/kg/hr) 4150 (2.7)    Total Output 4150     Net -3355            PHYSICAL EXAMINATION: General: no distress Neuro: follows simple commands, tremulous jaw, moves all extremities HEENT: poor dentition Cardiovascular: regular, tachycardic Lungs:scattered rhonchi Abdomen: soft, non tender Musculoskeletal:  No edema Skin:  No rash  LABS: CBC  Recent Labs Lab 04/09/13 1135 04/10/13 0302 04/11/13 0430  WBC 15.4* 13.4* 8.9  HGB 11.7* 10.9* 11.5*  HCT 35.2* 32.4* 33.9*  PLT 247 239 259   BMET  Recent Labs Lab 04/10/13 0302 04/10/13 2223 04/11/13 0430  NA 144 144 142  K 3.1* 3.4* 3.2*  CL 105  103 100  CO2 24 22 22   BUN 10 12 16   CREATININE 0.65 0.65 0.68  GLUCOSE 92 131* 123*   Electrolytes  Recent Labs Lab 04/08/13 0140  04/08/13 1250 04/10/13 0302 04/10/13 2223 04/11/13 0430  CALCIUM 7.6*  < > 7.7* 8.0* 8.0* 8.3*  MG 1.5  --  2.3  --  1.8  --   PHOS 2.8  --   --   --   --   --   < > = values in this interval not displayed. Sepsis Markers  Recent Labs Lab 04/07/13 0823 04/07/13 1920  LATICACIDVEN 2.75* 1.3   ABG  Recent Labs Lab 04/07/13 1009  PHART 7.327*  PCO2ART 50.0*  PO2ART 238.0*   Liver Enzymes  Recent Labs Lab 04/06/13 1514 04/08/13 0428  AST 37 57*  ALT 20 38  ALKPHOS 92 59  BILITOT 0.3 0.3  ALBUMIN 3.5 2.5*   Cardiac Enzymes  Recent Labs Lab 04/07/13 0810  TROPONINI <0.30  PROBNP 415.1   Imaging:  3/19 CXR>  IMPRESSION:  1. Similar appearance of patchy left basilar airspace disease, suspicious for pneumonia.  2. Small right pleural effusion, new relative to prior study. No pulmonary edema.   3/21 CXR>  IMPRESSION:  Multifocal patchy opacities, suspicious for pneumonia, increased. Suspected small left pleural effusion.    ASSESSMENT / PLAN:  A: Acute respiratory failure 2nd to probable aspiration PNA vs HCAP. Volume excess, not getting BDs on schedule. Small Rt pleural effusion. Hx of COPD. P: - BDs on schedule and intensifiy - IV lasix,  KVO IVF - Serial CXRs - IV solumedrol ([id number]) - Cont ABX - on Primaxin & Vanco - try IS & teach how to use - Continue step down level of care   Lindora Alviar MMD [facility] Pulmonary/Critical Care 04/11/2013, 11:46 AM [phone]

## 2013-04-11 NOTE — Consult Note (Signed)
Referring Physician: FPTS Primary Cardiologist: None Reason for Consultation: AF   HPI:  79 y/o with h/o mental retardation, COPD with ongoing tobacco use, psychosis admitted with severe respiratory distress d/t presumes aspiration PNA.  No mention of cardiac history in chart. While in hospital has developed intermittent A flutter with reported periods of sinus bradycardia as low as 30. Apparently had episode of torsades on 3/19. Palliative Care consult pending.   Patient sitting up in bed. Does not answer questions. Just tells me he wants to eat.      Review of Systems: Unable to provide due to condition   Past Medical History  Diagnosis Date  . COPD (chronic obstructive pulmonary disease)   . History of stomach ulcers   . Retardation   . Anemia   . Hypertension   . GERD (gastroesophageal reflux disease)   . Dementia     Medications Prior to Admission  Medication Sig Dispense Refill  . albuterol (PROAIR HFA) 108 (90 BASE) MCG/ACT inhaler Inhale 2 puffs into the lungs every 4 (four) hours as needed for wheezing or shortness of breath. Use with spacer      . cetirizine (ZYRTEC) 10 MG tablet Take 10 mg by mouth every morning.       . docusate sodium (COLACE) 100 MG capsule Take 100 mg by mouth 2 (two) times daily. Scheduled      . ENSURE (ENSURE) Drink by mouth every other day  946 mL  11  . esomeprazole (NEXIUM) 40 MG capsule Take 40 mg by mouth 2 (two) times daily before a meal.      . fluticasone (FLOVENT HFA) 220 MCG/ACT inhaler Inhale 1 puff into the lungs 2 (two) times daily.  1 Inhaler  11  . Ipratropium-Albuterol (COMBIVENT) 20-100 MCG/ACT AERS respimat Inhale 1 puff into the lungs every 6 (six) hours as needed for wheezing.  4 g  6  . lurasidone (LATUDA) 80 MG TABS Take 160 mg by mouth daily with supper.       . Memantine HCl ER (NAMENDA XR) 28 MG CP24 Take 28 mg by mouth every morning.       . metoprolol succinate (TOPROL-XL) 50 MG 24 hr tablet Take 50 mg  by mouth every morning. Take with or immediately following a meal.      . potassium chloride SA (K-DUR,KLOR-CON) 20 MEQ tablet Take 20 mEq by mouth every morning.      . simvastatin (ZOCOR) 20 MG tablet Take 20 mg by mouth at bedtime.       . sucralfate (CARAFATE) 1 GM/10ML suspension Take 1 g by mouth 4 (four) times daily.       Marland Kitchen thioridazine (MELLARIL) 25 MG tablet Take 50 mg by mouth 2 (two) times daily.      Marland Kitchen triamterene-hydrochlorothiazide (DYAZIDE) 37.5-25 MG per capsule Take 1 capsule by mouth every morning.          Marland Kitchen amiodarone  150 mg Intravenous Once  . Chlorhexidine Gluconate Cloth  6 each Topical Q0600  . feeding supplement (ENSURE COMPLETE)  237 mL Oral BID BM  . heparin  5,000 Units Subcutaneous 3 times per day  . hydrocerin   Topical BID  . imipenem-cilastatin  500 mg Intravenous Q8H  . ipratropium  0.5 mg Nebulization Q6H  . levalbuterol  0.63 mg Nebulization 4 times per day  . Memantine HCl ER  1 capsule Oral QPC breakfast  . methylPREDNISolone (SOLU-MEDROL) injection  40 mg Intravenous Q12H  .  mupirocin ointment  1 application Nasal BID  . pantoprazole (PROTONIX) IV  40 mg Intravenous QHS  . potassium chloride  10 mEq Intravenous Q1 Hr x 5  . simvastatin  20 mg Oral QHS  . thioridazine  50 mg Oral BID  . vancomycin  500 mg Intravenous Q12H    Infusions: . sodium chloride 1,000 mL (04/10/13 1520)  . amiodarone (NEXTERONE PREMIX) 360 mg/200 mL dextrose     Followed by  . amiodarone (NEXTERONE PREMIX) 360 mg/200 mL dextrose      Allergies  Allergen Reactions  . Penicillins     Unsure of reaction    History   Social History  . Marital Status: Single    Spouse Name: N/A    Number of Children: N/A  . Years of Education: N/A   Occupational History  . Not on file.   Social History Main Topics  . Smoking status: Former Games developer  . Smokeless tobacco: Not on file  . Alcohol Use: No  . Drug Use: No  . Sexual Activity: Not on file   Other Topics Concern   . Not on file   Social History Narrative  . No narrative on file   Family Hx - Patient unable to provide  PHYSICAL EXAM: Filed Vitals:   04/11/13 1233  BP: 145/78  Pulse: 76  Temp: 98.7 F (37.1 C)  Resp: 19   General:Sitting up in bed. Mildly tachypneic. Audibly rhonchorous HEENT: abrasion on mid forehead. Poor dentition Cardiovascular: RRR no obvious murmurs Respiratory: diffuse rhonchi Abdomen: Soft/NT/ND, + BS  Extremities: no pedal edema, Skin: In tact, dry, warm  Neuro: Alert. Nonfocal.    Intake/Output Summary (Last 24 hours) at 04/11/13 1336 Last data filed at 04/11/13 1234  Gross per 24 hour  Intake    245 ml  Output   4050 ml  Net  -3805 ml    ECG: SB 52. LVH with early repol. Other ECGs with AFL 115.   Tele: Periods of AFL ~120. I do not see any sustained bradycardia. Now NSR  Results for orders placed during the hospital encounter of 04/07/13 (from the past 24 hour(s))  BASIC METABOLIC PANEL     Status: Abnormal   Collection Time    04/10/13 10:23 PM      Result Value Ref Range   Sodium 144  137 - 147 mEq/L   Potassium 3.4 (*) 3.7 - 5.3 mEq/L   Chloride 103  96 - 112 mEq/L   CO2 22  19 - 32 mEq/L   Glucose, Bld 131 (*) 70 - 99 mg/dL   BUN 12  6 - 23 mg/dL   Creatinine, Ser 5.40  0.50 - 1.35 mg/dL   Calcium 8.0 (*) 8.4 - 10.5 mg/dL   GFR calc non Af Amer >90  >90 mL/min   GFR calc Af Amer >90  >90 mL/min  MAGNESIUM     Status: None   Collection Time    04/10/13 10:23 PM      Result Value Ref Range   Magnesium 1.8  1.5 - 2.5 mg/dL  BASIC METABOLIC PANEL     Status: Abnormal   Collection Time    04/11/13  4:30 AM      Result Value Ref Range   Sodium 142  137 - 147 mEq/L   Potassium 3.2 (*) 3.7 - 5.3 mEq/L   Chloride 100  96 - 112 mEq/L   CO2 22  19 - 32 mEq/L   Glucose, Bld 123 (*)  70 - 99 mg/dL   BUN 16  6 - 23 mg/dL   Creatinine, Ser 8.290.68  0.50 - 1.35 mg/dL   Calcium 8.3 (*) 8.4 - 10.5 mg/dL   GFR calc non Af Amer >90  >90 mL/min    GFR calc Af Amer >90  >90 mL/min  CBC     Status: Abnormal   Collection Time    04/11/13  4:30 AM      Result Value Ref Range   WBC 8.9  4.0 - 10.5 K/uL   RBC 3.78 (*) 4.22 - 5.81 MIL/uL   Hemoglobin 11.5 (*) 13.0 - 17.0 g/dL   HCT 56.233.9 (*) 13.039.0 - 86.552.0 %   MCV 89.7  78.0 - 100.0 fL   MCH 30.4  26.0 - 34.0 pg   MCHC 33.9  30.0 - 36.0 g/dL   RDW 78.417.0 (*) 69.611.5 - 29.515.5 %   Platelets 259  150 - 400 K/uL   Dg Chest Port 1 View  04/10/2013   CLINICAL DATA:  Respiratory distress, possible sepsis  EXAM: PORTABLE CHEST - 1 VIEW  COMPARISON:  04/08/2013  FINDINGS: Multifocal patchy opacities in the right upper lobe and bilateral lower lobes, suspicious for multifocal pneumonia. When compared to the prior, the right upper lobe opacity is new/ increased. Suspected small left pleural effusion. No pneumothorax.  The heart is normal in size.  IMPRESSION: Multifocal patchy opacities, suspicious for pneumonia, increased.  Suspected small left pleural effusion.   Electronically Signed   By: Charline BillsSriyesh  Krishnan M.D.   On: 04/10/2013 17:54    ASSESSMENT: 1. Acute respiratory failure 2. Aspiration PNA 3. Mental retardation 4. Intermittent AFL with RVR  5. Psychosis  PLAN/DISCUSSION:  Suspect intermittent AFL due to respiratory distress. Would start IV amiodarone while in hospital to control rhythm. As his respiratory status improves can wean off. He is not candidate for long-term anticoagulation. Can consider echo if continuing with aggressive care.   Shataya Winkles,MD 1:45 PM

## 2013-04-12 ENCOUNTER — Inpatient Hospital Stay (HOSPITAL_COMMUNITY): Payer: Medicare Other

## 2013-04-12 DIAGNOSIS — F03918 Unspecified dementia, unspecified severity, with other behavioral disturbance: Secondary | ICD-10-CM

## 2013-04-12 DIAGNOSIS — I4892 Unspecified atrial flutter: Secondary | ICD-10-CM

## 2013-04-12 DIAGNOSIS — I059 Rheumatic mitral valve disease, unspecified: Secondary | ICD-10-CM

## 2013-04-12 DIAGNOSIS — J9620 Acute and chronic respiratory failure, unspecified whether with hypoxia or hypercapnia: Secondary | ICD-10-CM

## 2013-04-12 DIAGNOSIS — D509 Iron deficiency anemia, unspecified: Secondary | ICD-10-CM

## 2013-04-12 DIAGNOSIS — E785 Hyperlipidemia, unspecified: Secondary | ICD-10-CM

## 2013-04-12 DIAGNOSIS — F0391 Unspecified dementia with behavioral disturbance: Secondary | ICD-10-CM

## 2013-04-12 LAB — BASIC METABOLIC PANEL
BUN: 25 mg/dL — AB (ref 6–23)
CHLORIDE: 105 meq/L (ref 96–112)
CO2: 25 mEq/L (ref 19–32)
Calcium: 8.5 mg/dL (ref 8.4–10.5)
Creatinine, Ser: 0.65 mg/dL (ref 0.50–1.35)
GFR calc non Af Amer: >90 mL/min (ref >90)
GLUCOSE: 172 mg/dL — AB (ref 70–99)
Potassium: 4 mEq/L (ref 3.7–5.3)
Sodium: 144 mEq/L (ref 137–147)

## 2013-04-12 MED ORDER — MORPHINE SULFATE 2 MG/ML IJ SOLN: 2.0000 mg | INTRAMUSCULAR | Status: DC | PRN

## 2013-04-12 MED ORDER — FUROSEMIDE 10 MG/ML IJ SOLN
40.0000 mg | Freq: Two times a day (BID) | INTRAMUSCULAR | Status: DC
  Administered 2013-04-12 (×2): 40 mg via INTRAVENOUS
  Filled 2013-04-12 (×3): qty 4

## 2013-04-12 MED ORDER — POTASSIUM CHLORIDE 10 MEQ/100ML IV SOLN
10.0000 meq | Freq: Once | INTRAVENOUS | Status: AC
  Administered 2013-04-12: 10 meq via INTRAVENOUS
  Filled 2013-04-12: qty 100
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
## 2013-04-12 NOTE — Progress Notes (Signed)
Family Medicine Teaching Service Daily Progress Note Intern Pager: [phone]  Patient name: [name] [name] Medical record number: [id number] Date of birth: 09/24/1934 Age: 79 y.o. Gender: male  Primary Care Provider: Jacquelin HawkingNettey, Ralph, MD Consultants: CCM Code Status: Full (DSS next of contact)  Pt Overview and Major Events to Date:  3/18: admitted for respiratory distress, on Bipap, Zenaida NieceVan and Primaxin for HCAP coverage 3/18: Taken off BiPAP by CCM, placed on [place]  3/19: had episodes of torsades, potassium still low, Mg and Phos normal 3/19: improved work of breathing  3/20: palliative care meeting set for Monday 3/23 3/21: Atrial tachyarrythmia, overnight tachy/brady to 30s   ABX/Culture Vanc (3/18)>>  Primaxin (3/18)>>  Resp Cx (needs to be collected)>> Urine Cx (3/18)>>No growth  Blood cx (3/18)>>NGTD Stool cx (3/17)>> Rersp swab (3/19)>>negative MRSA Screen Positive  Assessment and Plan: [name] [name] is a 79 y.o. male presenting with respiratory distress secondary to suspected aspiration PNA . PMH is significant for MR, COPD/Asthma, tobacco abuse.   #Severe Sepsis secondary to HCAP/Aspiration PNA w/ Acute Respiratory Distress: Improving,  - CCM re-consulted 3/21- appreciate recommendations  - Pt seems wet but improved- KVO fluids  - now negative 1/2 L since yesterday - patient will have palliative care meeting to discuss goals of care today  - WBC trending down, 8.9   - vanc and primaxin, consider de-escalating to levaquin today   - Continue 4L [place] O2 - WOB moderate, was down to RA overnight but required non-rebreather around 7:30am for tachycardia and desat to 60% - Tylenol for fever, none in >24 hours  - Duoneb Q6 PRN  - question of airway protection agreed with by CCM so will need intubation if he needs more resp support again, no BiPAP - Speech Re-eval for swallow function, will hold off given desat this am  #Tachycardia - Intermittent Atrial flutter with RVR, likely  related to respiratory distress - Cardiology consulted, appreciate recs  - Amiodarone during hospitalization, to be weaned as respiratory status improves - echo ordered by cards, follow-up results of this likely later today  #Mixed Respiratory Acidosis (CO2 retention), Metabolic acidosis (elevated lactic acid), and Metabolic Alkalosis (vomiting):  improved. Still providing [name].  - Gap down to 14 this am  - receiving fluids as above    #Hypokalemia: Resolved - Had arrythmias possibly associated with hypoK earlier in admission  #Hx of COPD and Asthma - Most likely cause of his Chronic Respiratory Acidosis  -Continue home meds - pulmocort and PRN duoneb  #MR/Psychiatry Mood Disorder: may discontinue meds on discharge as he may be over medicated and contributing to his aspiration.  - Holding oral meds now due to being NPO for concern of aspiration riskontinue home medications including Latuda, Thioridazine,  - complicates eval of his MS as baseline is unclear, per nursing he spoketo family at bedside yesterday  #Diarrhea: resolved . Most likely a viral gastro.  - C. Diff: negative.  - Gi panel: negative  - Holding Laxatives   # Hx of HTN: blood pressures improved  - Holding home Metoprolol/Dyazide   FEN/GI: KVO fluids, NPO Prophylaxis: Heparin SQ  Disposition: continue step down considering risk of more resp support and waxing/waning MS  Subjective:  Not responding to questions this am  Objective: Temp:  [97.4 F (36.3 C)-98.8 F (37.1 C)] 97.6 F (36.4 C) (03/23 0405) Pulse Rate:  [67-85] 85 (03/23 0405) Resp:  [12-30] 30 (03/23 0405) BP: (125-146)/(55-93) 144/89 mmHg (03/23 0405) SpO2:  [92 %-98 %] 92 % (03/23  0405) Weight:  [145 lb 4.5 oz (65.9 kg)] 145 lb 4.5 oz (65.9 kg) (03/23 0405) Physical Exam: General: NAD, alert, not responsive to questioning HEENT: abrasion on mid forehead Cardiovascular: tachy but regular rhythm, difficult to hear over loud coarse  breathing Respiratory: Course breath sounds, on 4L o2 via [place], mild to mod inc WOB  Abdomen: Soft/NT/ND, + BS Extremities: trace pedal edema, + 2 DP pulses Skin: Intact, dry, warm  Neuro: No focal deficits, appears to be at baseline level of function  Laboratory:  Recent Labs Lab 04/09/13 1135 04/10/13 0302 04/11/13 0430  WBC 15.4* 13.4* 8.9  HGB 11.7* 10.9* 11.5*  HCT 35.2* 32.4* 33.9*  PLT 247 239 259    Recent Labs Lab 04/06/13 1514  04/08/13 0428  04/11/13 0430 04/11/13 1640 04/12/13 0336  NA 137  < > 141  < > 142 143 144  K 3.7  < > 3.2*  < > 3.2* 3.6* 4.0  CL 99  < > 103  < > 100 103 105  CO2 25  < > 22  < > 22 24 25   BUN 24*  < > 21  < > 16 23 25*  CREATININE 1.06  < > 0.87  < > 0.68 0.65 0.65  CALCIUM 8.7  < > 7.6*  < > 8.3* 8.6 8.5  PROT 7.7  --  6.1  --   --   --   --   BILITOT 0.3  --  0.3  --   --   --   --   ALKPHOS 92  --  59  --   --   --   --   ALT 20  --  38  --   --   --   --   AST 37  --  57*  --   --   --   --   GLUCOSE 85  < > 66*  < > 123* 175* 172*  < > = values in this interval not displayed.  Mg: 1.5 (nml)  Phos: 2.89 (nml)  Lactic Acid: 2.75>>1.3 (nml)  Cortisol: 30.1 (nml)  TSH: 0.984 (nml)  Amylase: 183 (H) Lipase: 27 (nml)  LDH: 262 (h)  C. Diff: negative  RSV: pending Legionella, urinary: pending  Step pneumo, Ag: Negative  MRSA: positive  HIV: NR  Pro BNP: 415.1 (nml)  GI path panel (3/17)>>negative     Recent Labs Lab 04/07/13 0810  TROPONINI <0.30    Recent Labs Lab 04/07/13 1009  PHART 7.327*  PCO2ART 50.0*  PO2ART 238.0*  HCO3 25.6*  TCO2 27  O2SAT 100.0   Urinalysis    Component Value Date/Time   COLORURINE YELLOW 04/07/2013 0905   APPEARANCEUR CLOUDY* 04/07/2013 0905   LABSPEC 1.021 04/07/2013 0905   PHURINE 5.5 04/07/2013 0905   GLUCOSEU NEGATIVE 04/07/2013 0905   HGBUR NEGATIVE 04/07/2013 0905   BILIRUBINUR NEGATIVE 04/07/2013 0905   KETONESUR NEGATIVE 04/07/2013 0905   PROTEINUR 30* 04/07/2013 0905    UROBILINOGEN 0.2 04/07/2013 0905   NITRITE NEGATIVE 04/07/2013 0905   LEUKOCYTESUR NEGATIVE 04/07/2013 0905    Imaging/Diagnostic Tests: CXR 04/07/13  Increased density predominantly at the left lung base is consistent with acute atelectasis or pneumonia. There are chronic fibrotic changes in both lungs. There is no evidence of pulmonary edema.  CT head 04/07/13:  IMPRESSION:  No acute intracranial pathology.  CXR 04/07/13 IMPRESSION:  Persistent asymmetric left basilar airspace disease suspicious for pneumonia or aspiration.  CXR 04/08/13 1. Similar  appearance of patchy left basilar airspace disease, suspicious for pneumonia.  2. Small right pleural effusion, new relative to prior study. No pulmonary edema.  CXR 3/22 IMPRESSION:  Multifocal patchy opacities, suspicious for pneumonia, increased.  Suspected small left pleural effusion.   Beverely Low, MD 04/12/2013, 7:15 AM PGY-1, [facility] Family Medicine FPTS Intern pager: [phone], text pages welcome

## 2013-04-12 NOTE — Progress Notes (Signed)
Echocardiogram 2D Echocardiogram has been performed.  [name] [name] 04/12/2013, 10:34 AM

## 2013-04-12 NOTE — Progress Notes (Signed)
FMTS Attending Daily Note: Rafiel Mecca MD [phone] pager office [phone] I  have seen and examined this patient, reviewed their chart. I have discussed this patient with the resident. I agree with the resident's findings, assessment and care plan. 

## 2013-04-12 NOTE — Progress Notes (Signed)
I read palliative note, stating: "If patient not able to be stabilize in the next 24 hours or we find terminal dysphagia once his breathing is stabilized would recommend transition to hospice home for symptom management at that time." I am following case and will make referrals for residential hospice if/when this becomes the treatment plan. I went to pt's room to provide support to friends/guardian for pt, but no one was in room.   Maryclare LabradorJulie Myeesha Shane, MSW, [facility] LLCCSWA Clinical Social Worker [phone]

## 2013-04-12 NOTE — Progress Notes (Signed)
PULMONARY / CRITICAL CARE MEDICINE   Name: [name] [name] MRN: [id number] DOB: 1934-09-23    ADMISSION DATE:  04/07/2013 CONSULTATION DATE:  04/07/13  REFERRING MD :  FPTS  CHIEF COMPLAINT:  resp distress, PNA, sepsis  BRIEF PATIENT DESCRIPTION:  79 yo male smoker with n/v and respiratory distress with concern for aspiration pneumonia.  Has hx of COPD, mental retardation, psychosis.  PCCM reconsulted 3/21 after signing off 3/19 to assess for resp distress  SIGNIFICANT EVENTS: 3/18 Admit  STUDIES:  3/18 CT head >> no acute findings  LINES / TUBES: PIV  CULTURE: Respiratory viral panel 3/18 >>neg C diff PCR 3/18 >> negative Blood 3/18 >> neg Urine 3/18 >> negative  ANTIBIOTICS: Primaxin 3/18 >> Vancomycin 3/18 >>   SUBJECTIVE:  Mild resp distress.  I>>>O  HR elevated  VITAL SIGNS: Temp:  [97.4 F (36.3 C)-98.8 F (37.1 C)] 98.2 F (36.8 C) (03/23 0816) Pulse Rate:  [67-85] 72 (03/23 1000) Resp:  [18-30] 20 (03/23 1000) BP: (137-149)/(59-93) 149/81 mmHg (03/23 1000) SpO2:  [92 %-97 %] 92 % (03/23 1000) Weight:  [145 lb 4.5 oz (65.9 kg)] 145 lb 4.5 oz (65.9 kg) (03/23 0405) INTAKE / OUTPUT: Intake/Output     03/22 0701 - 03/23 0700 03/23 0701 - 03/24 0700   I.V. (mL/kg) 322.1 (4.9) 106.8 (1.6)   IV Piggyback 500 100   Total Intake(mL/kg) 822.1 (12.5) 206.8 (3.1)   Urine (mL/kg/hr) 1300 (0.8) 125 (0.4)   Total Output 1300 125   Net -477.9 +81.8          PHYSICAL EXAMINATION: General: mild respiratory distress with increased upper airway secretion Neuro: follows simple commands, tremulous jaw, moves all extremities HEENT: poor dentition Cardiovascular: regular, tachycardic Lungs: Significant upper airway sounds audible in all long fields Abdomen: soft, non tender Musculoskeletal:  No edema Skin:  No rash  LABS: CBC  Recent Labs Lab 04/09/13 1135 04/10/13 0302 04/11/13 0430  WBC 15.4* 13.4* 8.9  HGB 11.7* 10.9* 11.5*  HCT 35.2* 32.4* 33.9*  PLT  247 239 259   BMET  Recent Labs Lab 04/11/13 0430 04/11/13 1640 04/12/13 0336  NA 142 143 144  K 3.2* 3.6* 4.0  CL 100 103 105  CO2 22 24 25   BUN 16 23 25*  CREATININE 0.68 0.65 0.65  GLUCOSE 123* 175* 172*   Electrolytes  Recent Labs Lab 04/08/13 0140  04/08/13 1250  04/10/13 2223 04/11/13 0430 04/11/13 1640 04/12/13 0336  CALCIUM 7.6*  < > 7.7*  < > 8.0* 8.3* 8.6 8.5  MG 1.5  --  2.3  --  1.8  --   --   --   PHOS 2.8  --   --   --   --   --   --   --   < > = values in this interval not displayed. Sepsis Markers  Recent Labs Lab 04/07/13 0823 04/07/13 1920  LATICACIDVEN 2.75* 1.3   ABG  Recent Labs Lab 04/07/13 1009  PHART 7.327*  PCO2ART 50.0*  PO2ART 238.0*   Liver Enzymes  Recent Labs Lab 04/06/13 1514 04/08/13 0428  AST 37 57*  ALT 20 38  ALKPHOS 92 59  BILITOT 0.3 0.3  ALBUMIN 3.5 2.5*   Cardiac Enzymes  Recent Labs Lab 04/07/13 0810  TROPONINI <0.30  PROBNP 415.1   Imaging:  3/19 CXR>  IMPRESSION:  1. Similar appearance of patchy left basilar airspace disease, suspicious for pneumonia.  2. Small right pleural effusion, new relative to prior  study. No pulmonary edema.   3/21 CXR>  IMPRESSION:  Multifocal patchy opacities, suspicious for pneumonia, increased. Suspected small left pleural effusion.  ASSESSMENT / PLAN:  A: Acute respiratory failure 2nd to probable aspiration PNA vs HCAP. Volume excess, not getting BDs on schedule. Small Rt pleural effusion. Hx of COPD. P: - BDs on schedule and intensifiy - IV lasix,  KVO IVF - Serial CXRs - IV solumedrol (40Q12h) - Cont ABX - on Primaxin & Vanco - Frequent secretion - Continue step down level of care - Patient is clearly not protecting his airway at this point.  Family is currently with palliative care.  If they continue to wish for full support then will need to intubate and trach (which is very much not recommended given the fact that I do not believe he will keep it in  place) but if no intubation then need to consider comfort care.  Will come back in the afternoon and check on patient.  Alyson ReedyWesam G. Analisse Randle, M.D. [facility]. Pager: [phone]. After hours pager: [phone].

## 2013-04-12 NOTE — Progress Notes (Signed)
Pt had adequate urine output until Foley catheter removed at 2150, per protocol.  At 0400 there was still no urine output, pt was unable to void independently, even with encouragement.  Lower abdomen over bladder was firm, pt did not complain of any discomfort.  Bladder scan showed 297ml urine in bladder.  MD was notified of above and recommended performing in and out catheterization. This was done with a total of 325ml amber, clear urine out.  During that call MD was also notified of am EKG showing Qtc 486, previous EKG shows Qtc of 440. Pt has been consistently NSR 69-70 throughout shift, thus far. Will continue to monitor.

## 2013-04-12 NOTE — Consult Note (Signed)
Patient [name] [name]      DOB: Aug 20, 1934      SHU:[id number]  Summary of Goals of Care; full note to follow:  Met with DSS social worker, brother [name] , and Care givers at [facility] then by phone with Lawson Radar who is the DSS supervisor responsible for ultimate decisions regarding the wards of the court in her care.   Cannon Kettle has authorized  DNR/ DNI.  She reports that we are to choose what we will feel will promote comfort and dignity.  At this time I have discussed this with the teaching service.  We will continue antibiotics,and add diurectics, followup on echo.  If patient not able to be stabilize in the next 24 hours or we find terminal dysphagia once his breathing is stabilized would recommend transition to hospice home for symptom management at that time.    Recommend :  1.  DNR/ DNI  2.  Pneumonia with possible chf overlay:  Spoke with teaching service will continue abx and try to diuresis.  If no improvement in the next 24 to 48 hours would convert to full comfort , or if his symptoms worsen.  Use morphine prn for severe respiratory distress only . Have low threshold for continuous opiates if he declines further and is in distress  3.  Dysphagia: try to stabilize pulmonary status and reeval .  Would not favor putting panda as patient will not understand and will likely be combative  4.  Prognosis guarded to very poor.  May need to transition to Hospice facility for end of life care if he does not respond to reasonable standards of care.  Please don't hesitate to call if symptoms not manage [phone].  Discussed with Dr. Nelda Marseille, and teaching service.  Total time 11 am- 1245 pm  Jernie Schutt L. Lovena Le, MD MBA The Palliative Medicine Team at [facility] Phone: [phone] Pager: [phone]

## 2013-04-12 NOTE — Progress Notes (Signed)
Pt had an episode of SOB while getting report from night shift, pt sats  dropped to low 60's,grayish color skin of the face noted sounds  ronchorous ,HR at 120's 130's. Put on non rebreather, suctioned  the mouth, sats went back up up to 80's-90's  RT was paged, breathing treatment was given ,now on [place] at 2L.Dr. Richarda BladeAdamo  was informed.

## 2013-04-12 NOTE — Progress Notes (Signed)
Subjective: Alert but does not respond to questions.   Objective: Vital signs in last 24 hours: Temp:  [97.4 F (36.3 C)-98.8 F (37.1 C)] 98.2 F (36.8 C) (03/23 0816) Pulse Rate:  [67-85] 71 (03/23 0816) Resp:  [18-30] 19 (03/23 0816) BP: (137-146)/(59-93) 137/78 mmHg (03/23 0816) SpO2:  [92 %-97 %] 97 % (03/23 0821) Weight:  [145 lb 4.5 oz (65.9 kg)] 145 lb 4.5 oz (65.9 kg) (03/23 0405) Last BM Date:  (prior to admission, diarrhea reported by group home staff)  Intake/Output from previous day: 03/22 0701 - 03/23 0700 In: 768.7 [I.V.:268.7; IV Piggyback:500] Out: 1025 [Urine:1025] Intake/Output this shift:    Medications Current Facility-Administered Medications  Medication Dose Route Frequency Provider Last Rate Last Dose  . 0.9 %  sodium chloride infusion  1,000 mL Intravenous Continuous Storm Frisk, MD 10 mL/hr at 04/10/13 1520 1,000 mL at 04/10/13 1520  . acetaminophen (TYLENOL) suppository 650 mg  650 mg Rectal Q6H PRN Myra Rude, MD   650 mg at 04/10/13 1422  . albuterol (PROVENTIL) (2.5 MG/3ML) 0.083% nebulizer solution 2.5 mg  2.5 mg Nebulization Q4H PRN Uvaldo Rising, MD   2.5 mg at 04/10/13 1221  . amiodarone (NEXTERONE PREMIX) 360 mg/200 mL dextrose IV infusion  30 mg/hr Intravenous Continuous Leona Singleton, MD 16.7 mL/hr at 04/12/13 0335 30 mg/hr at 04/12/13 0335  . Chlorhexidine Gluconate Cloth 2 % PADS 6 each  6 each Topical Q0600 Uvaldo Rising, MD   6 each at 04/12/13 0630  . feeding supplement (ENSURE COMPLETE) (ENSURE COMPLETE) liquid 237 mL  237 mL Oral BID BM Ailene Ards, RD   237 mL at 04/11/13 0955  . heparin injection 5,000 Units  5,000 Units Subcutaneous 3 times per day Briscoe Deutscher, DO   5,000 Units at 04/12/13 0630  . hydrocerin (EUCERIN) cream   Topical BID Briscoe Deutscher, DO   1 application at 04/11/13 2248  . imipenem-cilastatin (PRIMAXIN) 500 mg in sodium chloride 0.9 % 100 mL IVPB  500 mg Intravenous Q8H [facility], RPH   500 mg at 04/12/13 1324  . ipratropium (ATROVENT) nebulizer solution 0.5 mg  0.5 mg Nebulization Q6H Storm Frisk, MD   0.5 mg at 04/12/13 4010  . levalbuterol (XOPENEX) nebulizer solution 0.63 mg  0.63 mg Nebulization 4 times per day Storm Frisk, MD   0.63 mg at 04/12/13 2725  . Memantine HCl ER CP24 28 mg  1 capsule Oral QPC breakfast Twana First Hess, DO   28 mg at 04/11/13 3664  . methylPREDNISolone sodium succinate (SOLU-MEDROL) 40 mg/mL injection 40 mg  40 mg Intravenous Q12H Storm Frisk, MD   40 mg at 04/12/13 4034  . mupirocin ointment (BACTROBAN) 2 % 1 application  1 application Nasal BID Uvaldo Rising, MD   1 application at 04/11/13 2248  . pantoprazole (PROTONIX) injection 40 mg  40 mg Intravenous QHS Elenora Gamma, MD   40 mg at 04/11/13 2337  . RESOURCE THICKENUP CLEAR   Oral PRN Riley Nearing Deblois, CCC-SLP      . simvastatin (ZOCOR) tablet 20 mg  20 mg Oral QHS Bryan R Hess, DO   20 mg at 04/11/13 2248  . vancomycin (VANCOCIN) 500 mg in sodium chloride 0.9 % 100 mL IVPB  500 mg Intravenous Q12H Lauren Bajbus, RPH   500 mg at 04/11/13 2337    PE: General appearance: alert and no distress Lungs: Bilateral  rales.  no wheezin.  Neck:  No JVD Heart: regular rate and rhythm, S1, S2 normal, no murmur, click, rub or gallop Abdomen: +BS, nontender Extremities: No LEE Pulses: 2+ and symmetric Skin: Warm and dry Neurologic: Alert  Lab Results:   Recent Labs  04/09/13 1135 04/10/13 0302 04/11/13 0430  WBC 15.4* 13.4* 8.9  HGB 11.7* 10.9* 11.5*  HCT 35.2* 32.4* 33.9*  PLT 247 239 259   BMET  Recent Labs  04/11/13 0430 04/11/13 1640 04/12/13 0336  NA 142 143 144  K 3.2* 3.6* 4.0  CL 100 103 105  CO2 22 24 25   GLUCOSE 123* 175* 172*  BUN 16 23 25*  CREATININE 0.68 0.65 0.65  CALCIUM 8.3* 8.6 8.5   PT/INR  Assessment/Plan   79 y/o with h/o mental retardation, COPD with ongoing tobacco use, psychosis admitted with severe  respiratory distress d/t presumes aspiration PNA.   No mention of cardiac history in chart. While in hospital has developed intermittent A flutter with reported periods of sinus bradycardia as low as 30. Apparently had episode of torsades on 3/19.  Palliative Care consult pending.  Principal Problem:   Severe sepsis Active Problems:   ANEMIA, IRON DEFICIENCY NOS   MENTAL RETARDATION   HYPERTENSION, BENIGN SYSTEMIC   COPD   GASTROESOPHAGEAL REFLUX, NO ESOPHAGITIS   BARRETTS ESOPHAGUS   Aspiration pneumonia   Malnutrition of moderate degree   Atrial flutter   Hypokalemia  Plan:  Continues to have Paroxysmal ST to the 160's as well as periods of bradycardia in to the 40's(yesterday morning).   Currently Sinus rhythm 80's.  On IV amiodarone-Continue.  Monitor HR.  Avoid beta blockers given bradycardia and resp status.  2D echo pending. Potassium WNL.  Hgb stable.  Palliative med meeting today.   LOS: 5 days    HAGER, BRYAN 04/12/2013 8:38 AM  Patient seen, examined. Available data reviewed. Agree with findings, assessment, and plan as outlined by Wilburt FinlayBryan Hager, PA-C. The patient is awake and alert. However, he is not able to provide any meaningful history. He does not really respond to any questions in a meaningful way. His lungs show rhonchi bilaterally. Heart is regular rate and rhythm. There is mild pretibial edema bilaterally. Telemetry reviewed and the patient currently is in sinus rhythm. Would keep on IV amiodarone today and await palliative care meeting. Will followup tomorrow morning. No AV nodal blockers because of bradycardia arrhythmias. He is not a candidate for anticoagulation. Conservative/palliative care seems appropriate.  Tonny BollmanMichael Kyce Ging, M.D. 04/12/2013 10:32 AM

## 2013-04-12 NOTE — Progress Notes (Signed)
SLP Cancellation Note  Patient Details Name: Jeris PentaDonald Seabury MRN: [id number] DOB: 1934-02-12   Cancelled treatment:       Reason Eval/Treat Not Completed: Medical issues which prohibited therapy. Attempted MBS, cancelled due to respiratory issues per radiology.    Halston Fairclough, Riley NearingBonnie Caroline 04/12/2013, 11:31 AM

## 2013-04-13 LAB — CBC
HEMATOCRIT: 36.4 % — AB (ref 39.0–52.0)
HEMOGLOBIN: 12.3 g/dL — AB (ref 13.0–17.0)
MCH: 30.4 pg (ref 26.0–34.0)
MCHC: 33.8 g/dL (ref 30.0–36.0)
MCV: 89.9 fL (ref 78.0–100.0)
Platelets: 316 10*3/uL (ref 150–400)
RBC: 4.05 MIL/uL — AB (ref 4.22–5.81)
RDW: 16.8 % — ABNORMAL HIGH (ref 11.5–15.5)
WBC: 13.8 10*3/uL — ABNORMAL HIGH (ref 4.0–10.5)

## 2013-04-13 LAB — CULTURE, BLOOD (ROUTINE X 2)
Culture: NO GROWTH
Culture: NO GROWTH

## 2013-04-13 LAB — GLUCOSE, CAPILLARY: Glucose-Capillary: 140 mg/dL — ABNORMAL HIGH (ref 70–99)

## 2013-04-13 LAB — BASIC METABOLIC PANEL
BUN: 28 mg/dL — AB (ref 6–23)
CHLORIDE: 102 meq/L (ref 96–112)
CO2: 29 meq/L (ref 19–32)
Calcium: 8.6 mg/dL (ref 8.4–10.5)
Creatinine, Ser: 0.7 mg/dL (ref 0.50–1.35)
GFR calc Af Amer: >90 mL/min (ref >90)
GFR, EST NON AFRICAN AMERICAN: 89 mL/min — AB (ref >90)
GLUCOSE: 149 mg/dL — AB (ref 70–99)
POTASSIUM: 4.2 meq/L (ref 3.7–5.3)
Sodium: 147 mEq/L (ref 137–147)

## 2013-04-13 MED ORDER — CLINDAMYCIN PALMITATE HCL 75 MG/5ML PO SOLR
600.0000 mg | Freq: Three times a day (TID) | ORAL | Status: DC
Start: 2013-04-13 — End: 2013-04-13

## 2013-04-13 MED ORDER — AMIODARONE HCL 200 MG PO TABS
400.0000 mg | ORAL_TABLET | Freq: Every day | ORAL | Status: DC
  Administered 2013-04-13 – 2013-04-19 (×7): 400 mg via ORAL
  Filled 2013-04-13 (×7): qty 2

## 2013-04-13 MED ORDER — FUROSEMIDE 10 MG/ML IJ SOLN
40.0000 mg | Freq: Every day | INTRAMUSCULAR | Status: DC
  Administered 2013-04-13 – 2013-04-15 (×3): 40 mg via INTRAVENOUS
  Filled 2013-04-13 (×3): qty 4

## 2013-04-13 MED ORDER — AZITHROMYCIN 200 MG/5ML PO SUSR
250.0000 mg | Freq: Every day | ORAL | Status: DC
  Administered 2013-04-13 – 2013-04-15 (×3): 250 mg via ORAL
  Filled 2013-04-13 (×3): qty 10

## 2013-04-13 NOTE — Progress Notes (Signed)
ANTIBIOTIC CONSULT NOTE - FOLLOW UP  Pharmacy Consult for vancomycin and Primaxin Indication: sepsis due to HCAP, aspiration PNA  Allergies  Allergen Reactions  . Penicillins     Unsure of reaction    Patient Measurements: Height: 5\' 11"  (180.3 cm) Weight: 141 lb 1.5 oz (64 kg) IBW/kg (Calculated) : 75.3  Vital Signs: Temp: 97.6 F (36.4 C) (03/24 0335) Temp src: Oral (03/24 0335) BP: 134/78 mmHg (03/24 0335) Pulse Rate: 71 (03/24 0150) Intake/Output from previous day: 03/23 0701 - 03/24 0700 In: 573.8 [I.V.:373.8; IV Piggyback:200] Out: 3818 [Urine:3425] Intake/Output from this shift:    Labs:  Recent Labs  04/11/13 0430 04/11/13 1640 04/12/13 0336 04/13/13 0350  WBC 8.9  --   --  13.8*  HGB 11.5*  --   --  12.3*  PLT 259  --   --  316  CREATININE 0.68 0.65 0.65 0.70   Estimated Creatinine Clearance: 71.1 ml/min (by C-G formula based on Cr of 0.7). No results found for this basename: Letta Median, VANCORANDOM, [place], GENTPEAK, GENTRANDOM, TOBRATROUGH, TOBRAPEAK, TOBRARND, AMIKACINPEAK, AMIKACINTROU, AMIKACIN,  in the last 72 hours   Microbiology: Recent Results (from the past 720 hour(s))  CLOSTRIDIUM DIFFICILE BY PCR     Status: None   Collection Time    04/06/13  7:47 PM      Result Value Ref Range Status   C difficile by pcr NEGATIVE  NEGATIVE Final   Comment: Performed at [facility], BLOOD (ROUTINE X 2)     Status: None   Collection Time    04/07/13  8:10 AM      Result Value Ref Range Status   Specimen Description BLOOD RIGHT ANTECUBITAL   Final   Special Requests BOTTLES DRAWN AEROBIC AND ANAEROBIC 5CCS   Final   Culture  Setup Time     Final   Value: 04/07/2013 13:50     Performed at [facility]   Culture     Final   Value: NO GROWTH 5 DAYS     Performed at [facility]   Report Status 04/13/2013 FINAL   Final  CULTURE, BLOOD (ROUTINE X 2)     Status: None   Collection Time    04/07/13  8:50  AM      Result Value Ref Range Status   Specimen Description BLOOD LEFT ANTECUBITAL   Final   Special Requests BOTTLES DRAWN AEROBIC ONLY 3CC   Final   Culture  Setup Time     Final   Value: 04/07/2013 14:32     Performed at [facility]   Culture     Final   Value: NO GROWTH 5 DAYS     Performed at [facility]   Report Status 04/13/2013 FINAL   Final  URINE CULTURE     Status: None   Collection Time    04/07/13  9:05 AM      Result Value Ref Range Status   Specimen Description URINE, CATHETERIZED   Final   Special Requests NONE   Final   Culture  Setup Time     Final   Value: 04/07/2013 09:59     Performed at [facility]     Final   Value: NO GROWTH     Performed at [facility]   Culture     Final   Value: NO GROWTH     Performed at [facility]  Report Status 04/08/2013 FINAL   Final  MRSA PCR SCREENING     Status: Abnormal   Collection Time    04/07/13  4:10 PM      Result Value Ref Range Status   MRSA by PCR POSITIVE (*) NEGATIVE Final   Comment:            The GeneXpert MRSA Assay (FDA     approved for NASAL specimens     only), is one component of a     comprehensive MRSA colonization     surveillance program. It is not     intended to diagnose MRSA     infection nor to guide or     monitor treatment for     MRSA infections.     RESULT CALLED TO, READ BACK BY AND VERIFIED WITH:     CARBONE RN 17:55 04/07/13 ([email or web address])  CLOSTRIDIUM DIFFICILE BY PCR     Status: None   Collection Time    04/07/13  7:10 PM      Result Value Ref Range Status   C difficile by pcr NEGATIVE  NEGATIVE Final  RESPIRATORY VIRUS PANEL     Status: None   Collection Time    04/07/13  7:10 PM      Result Value Ref Range Status   Source - RVPAN NASAL SWAB   Corrected   Comment: CORRECTED ON 03/19 AT 1848: PREVIOUSLY REPORTED AS NASAL SWAB   Respiratory Syncytial Virus A NOT DETECTED   Final   Respiratory Syncytial Virus B NOT  DETECTED   Final   Influenza A NOT DETECTED   Final   Influenza B NOT DETECTED   Final   Parainfluenza 1 NOT DETECTED   Final   Parainfluenza 2 NOT DETECTED   Final   Parainfluenza 3 NOT DETECTED   Final   Metapneumovirus NOT DETECTED   Final   Rhinovirus NOT DETECTED   Final   Adenovirus NOT DETECTED   Final   Influenza A H1 NOT DETECTED   Final   Influenza A H3 NOT DETECTED   Final   Comment: (NOTE)           Normal Reference Range for each Analyte: NOT DETECTED     Testing performed using the Luminex xTAG Respiratory Viral Panel test     kit.     This test was developed and its performance characteristics determined     by [facility]. It has not been cleared or approved by the [place]     Food and Drug Administration. This test is used for clinical purposes.     It should not be regarded as investigational or for research. This     laboratory is certified under the [place] (CLIA) as qualified to perform high complexity     clinical laboratory testing.     Performed at [place]   Start     Dose/Rate Route Frequency Ordered Stop   04/09/13 1200  imipenem-cilastatin (PRIMAXIN) 500 mg in sodium chloride 0.9 % 100 mL IVPB     500 mg 200 mL/hr over 30 Minutes Intravenous Every 8 hours 04/09/13 0809     04/07/13 2100  vancomycin (VANCOCIN) 500 mg in sodium chloride 0.9 % 100 mL IVPB  Status:  Discontinued     500 mg 100 mL/hr over 60 Minutes Intravenous Every 12 hours 04/07/13 0910 04/07/13 1347   04/07/13 2100  vancomycin (VANCOCIN) 500 mg in sodium chloride 0.9 % 100 mL IVPB     500 mg 100 mL/hr over 60 Minutes Intravenous Every 12 hours 04/07/13 1420     04/07/13 1500  aztreonam (AZACTAM) 1 g in dextrose 5 % 50 mL IVPB  Status:  Discontinued     1 g 100 mL/hr over 30 Minutes Intravenous 3 times per day 04/07/13 0910 04/07/13 1347   04/07/13 1430  imipenem-cilastatin (PRIMAXIN) 250 mg in sodium  chloride 0.9 % 100 mL IVPB  Status:  Discontinued     250 mg 200 mL/hr over 30 Minutes Intravenous 4 times per day 04/07/13 1424 04/09/13 0809   04/07/13 0900  aztreonam (AZACTAM) 2 g in dextrose 5 % 50 mL IVPB     2 g 100 mL/hr over 30 Minutes Intravenous  Once 04/07/13 0849 04/07/13 0940   04/07/13 0900  vancomycin (VANCOCIN) IVPB 1000 mg/200 mL premix     1,000 mg 200 mL/hr over 60 Minutes Intravenous  Once 04/07/13 0849 04/07/13 1053      Assessment: 79 y/o male admitted from group home with N/V/D and lethargy. He continues on broad-spectrum abx of vancomycin + primaxin. Today is D#7. Pt remains afebrile and WBC is slightly elevated at 13.8. Doses are appropriate. Cultures have been negative.   Aztreonam 3/18 x1 vanc 3/18>> Imipenem/cilastin 3/18>>  3/18 urine - NEG 3/18 Bcx - NEG 3/18 C diff - NEG MRSA PCR - POS 3/18 Virus panel - NEG  Goal of Therapy:  Vancomycin trough level 15-20 mcg/ml Resolution of infection  Plan:  - Continue vancomycin 500 mg IV q12h - Continue Primaxin to 500 mg IV q8h - F/u renal fxn, C&S, clinical status and trough at [facility] - MD - Notes have indicated possible plan to de-escalate antibiotics. Please clarify intended LOT.  - Will check trough today IF planning to continue vancomycin  Salome Arnt, PharmD, BCPS Pager # [phone] 04/13/2013 10:16 AM

## 2013-04-13 NOTE — Progress Notes (Signed)
Report called to Verlee Monteora, RN

## 2013-04-13 NOTE — Progress Notes (Addendum)
Subjective: Alert  Objective: Vital signs in last 24 hours: Temp:  [97.5 F (36.4 C)-98.4 F (36.9 C)] 97.6 F (36.4 C) (03/24 0335) Pulse Rate:  [63-240] 71 (03/24 0150) Resp:  [15-22] 17 (03/24 0150) BP: (134-161)/(68-87) 134/78 mmHg (03/24 0335) SpO2:  [88 %-100 %] 93 % (03/24 0335) Weight:  [141 lb 1.5 oz (64 kg)] 141 lb 1.5 oz (64 kg) (03/24 0335) Last BM Date: 04/12/13  Intake/Output from previous day: 03/23 0701 - 03/24 0700 In: 573.8 [I.V.:373.8; IV Piggyback:200] Out: 3425 [Urine:3425] Intake/Output this shift:    Medications Current Facility-Administered Medications  Medication Dose Route Frequency Provider Last Rate Last Dose  . 0.9 %  sodium chloride infusion  1,000 mL Intravenous Continuous Storm FriskPatrick E Wright, MD 10 mL/hr at 04/10/13 1520 1,000 mL at 04/10/13 1520  . acetaminophen (TYLENOL) suppository 650 mg  650 mg Rectal Q6H PRN Myra RudeJeremy E Schmitz, MD   650 mg at 04/10/13 1422  . albuterol (PROVENTIL) (2.5 MG/3ML) 0.083% nebulizer solution 2.5 mg  2.5 mg Nebulization Q4H PRN Uvaldo RisingKyle J Fletke, MD   2.5 mg at 04/10/13 1221  . amiodarone (NEXTERONE PREMIX) 360 mg/200 mL dextrose IV infusion  30 mg/hr Intravenous Continuous Leona SingletonMaria T Thekkekandam, MD 16.7 mL/hr at 04/12/13 1712 30 mg/hr at 04/12/13 1712  . Chlorhexidine Gluconate Cloth 2 % PADS 6 each  6 each Topical Q0600 Uvaldo RisingKyle J Fletke, MD   6 each at 04/12/13 0630  . feeding supplement (ENSURE COMPLETE) (ENSURE COMPLETE) liquid 237 mL  237 mL Oral BID BM Ailene Ardsatherine C Lamberton, RD   237 mL at 04/11/13 0955  . furosemide (LASIX) injection 40 mg  40 mg Intravenous BID Elenora GammaSamuel L Bradshaw, MD   40 mg at 04/12/13 1843  . heparin injection 5,000 Units  5,000 Units Subcutaneous 3 times per day Briscoe DeutscherBryan R Hess, DO   5,000 Units at 04/13/13 0500  . hydrocerin (EUCERIN) cream   Topical BID Bryan R Hess, DO      . imipenem-cilastatin (PRIMAXIN) 500 mg in sodium chloride 0.9 % 100 mL IVPB  500 mg Intravenous Q8H [facility] DistrictJennifer Danielle Kandiy[place],  RPH   500 mg at 04/13/13 0500  . ipratropium (ATROVENT) nebulizer solution 0.5 mg  0.5 mg Nebulization Q6H Storm FriskPatrick E Wright, MD   0.5 mg at 04/13/13 0153  . levalbuterol (XOPENEX) nebulizer solution 0.63 mg  0.63 mg Nebulization 4 times per day Storm FriskPatrick E Wright, MD   0.63 mg at 04/13/13 0153  . Memantine HCl ER CP24 28 mg  1 capsule Oral QPC breakfast Twana FirstBryan R Hess, DO   28 mg at 04/11/13 [id number]  . methylPREDNISolone sodium succinate (SOLU-MEDROL) 40 mg/mL injection 40 mg  40 mg Intravenous Q12H Storm FriskPatrick E Wright, MD   40 mg at 04/13/13 0349  . morphine 2 MG/ML injection 2 mg  2 mg Intravenous Q2H PRN Renae GlossMelissa L Taylor, MD      . mupirocin ointment (BACTROBAN) 2 % 1 application  1 application Nasal BID Uvaldo RisingKyle J Fletke, MD   1 application at 04/12/13 2126  . pantoprazole (PROTONIX) injection 40 mg  40 mg Intravenous QHS Elenora GammaSamuel L Bradshaw, MD   40 mg at 04/12/13 2126  . RESOURCE THICKENUP CLEAR   Oral PRN Riley NearingBonnie Caroline Deblois, CCC-SLP      . simvastatin (ZOCOR) tablet 20 mg  20 mg Oral QHS Bryan R Hess, DO   20 mg at 04/11/13 2248  . vancomycin (VANCOCIN) 500 mg in sodium chloride 0.9 % 100 mL IVPB  500 mg Intravenous Q12H Lauren Bajbus, RPH   500 mg at 04/12/13 2042    PE: General appearance: alert and no distress  Lungs: + rhonichi. no wheezing.  Neck: No JVD  Heart: regular rate and rhythm, S1, S2 normal, no murmur, click, rub or gallop  Abdomen: +BS, nontender  Extremities: No LEE  Pulses: 2+ and symmetric  Skin: Warm and dry  Neurologic: Alert  Lab Results:   Recent Labs  04/11/13 0430 04/13/13 0350  WBC 8.9 13.8*  HGB 11.5* 12.3*  HCT 33.9* 36.4*  PLT 259 316   BMET  Recent Labs  04/11/13 1640 04/12/13 0336 04/13/13 0350  NA 143 144 147  K 3.6* 4.0 4.2  CL 103 105 102  CO2 24 25 29   GLUCOSE 175* 172* 149*  BUN 23 25* 28*  CREATININE 0.65 0.65 0.70  CALCIUM 8.6 8.5 8.6    Assessment/Plan  Principal Problem:   Severe sepsis Active Problems:   ANEMIA, IRON  DEFICIENCY NOS   MENTAL RETARDATION   HYPERTENSION, BENIGN SYSTEMIC   COPD   GASTROESOPHAGEAL REFLUX, NO ESOPHAGITIS   BARRETTS ESOPHAGUS   Aspiration pneumonia   Malnutrition of moderate degree   Atrial flutter  Plan:  Maintaining sinus rhythm.  Some controlled PAF.  No long term anticoag.  HR with better control over night.  EF 55-60% with normal wall motion.  Mild MR.  BP stable.  No beta blocker.  Will change to PO amiodarone 400mg  daily.   Net fluids:  -2.9L/-3.1L.   SCr stable.  He appears euvolemic.  Recommend repeat CXR.  Palliative Med following.    LOS: 6 days    HAGER, BRYAN PA-C 04/13/2013 7:42 AM  Patient seen, examined. Available data reviewed. Agree with findings, assessment, and plan as outlined by Wilburt Finlay, PA-C. The patient is poorly responsive on my evaluation. He appears comfortable. Lungs are clear. Heart is regular rate and rhythm. There is mild peripheral edema. I have reviewed his telemetry which demonstrates sinus rhythm. Reviewed notes of the palliative medicine team. His echocardiogram was completed yesterday and is essentially normal. I think it is reasonable to continue furosemide to try to keep him as dry as possible. Would continue amiodarone to stabilize his heart rhythm. Would decrease to 200 mg daily in 2 weeks. I agree that conservative measures are appropriate. No other cardiac recommendations at this point. Please feel free to call back if any new issues arise.  Tonny Bollman, M.D. 04/13/2013 11:27 AM

## 2013-04-13 NOTE — Progress Notes (Signed)
Speech Language Pathology Treatment: Dysphagia  Patient Details Name: [name] [name] MRN: [id number] DOB: 08-23-1934 Today's Date: 04/13/2013 Time: [id number] SLP Time Calculation (min): 15 min  Assessment / Plan / Recommendation Clinical Impression  Pt alert and again eager to eat and drink. He appears to tolerate puree, but there is a delayed cough with thin liquids. Swallow does not appear severely impaired though there may be a need for a modified diet to protect pt. Continue to recommend MBS to determine best diet in a pt who will be able and willing to eat. Will complete MBS tomorrow. WIll also downgrade current diet to a modified texture that may be safer for pt.    HPI HPI: [name] PentaDonald Perris is a 79 y.o. male presenting with nausea vomiting and respiratory distress found to have aspiration PNA. Pt seen last night in [facility] due to diarrhea, had multiple evaluations performed, and GI panel pending, was dx and sent back to his group home. Patient brought to the ER by ambulance this AM from this group home due to respiratory distress and lethargy. Patient reportedly had nausea, vomiting and diarrhea through the night. This morning he was lethargic and struggling to breathe, EMS was called. They report oxygen saturation 67% on room air upon arrival, was started on NRB and had improvement on his O2. IN ed received aggressive volume and responded to volume. Still being treated with NIMV.    Pertinent Vitals NA  SLP Plan  Continue with current plan of care    Recommendations Diet recommendations: Dysphagia 2 (fine chop);Nectar-thick liquid Liquids provided via: Cup;No straw Medication Administration: Whole meds with puree Supervision: Full supervision/cueing for compensatory strategies;Staff to assist with self feeding Compensations: Slow rate;Small sips/bites Postural Changes and/or Swallow Maneuvers: Seated upright 90 degrees              Plan: Continue with current plan of care    GO     [facility] Recover LLCBonnie Kutler Vanvranken, MA CCC-SLP [phone]  Claudine MoutonDeBlois, Efrem Pitstick Caroline 04/13/2013, 3:11 PM

## 2013-04-13 NOTE — Progress Notes (Signed)
Iv pulled out per patient;cleaned and bed changed.

## 2013-04-13 NOTE — Progress Notes (Signed)
Family Medicine Teaching Service Daily Progress Note Intern Pager: [phone]  Patient name: [name] [name] Medical record [id number]: [id number] Date of birth: 1934/04/19 Age: 79 y.o. Gender: male  Primary Care Provider: Jacquelin HawkingNettey, Ralph, MD Consultants: CCM Code Status: Full (DSS next of contact)  Pt Overview and Major Events to Date:  3/18: admitted for respiratory distress, on Bipap, Zenaida NieceVan and Primaxin for HCAP coverage 3/18: Taken off BiPAP by CCM, placed on [place]  3/19: had episodes of torsades, potassium still low, Mg and Phos normal 3/19: improved work of breathing  3/20: palliative care meeting set for Monday 3/23 3/21: Atrial tachyarrythmia, overnight tachy/brady to 30s, CCM re-consulted,  3/22: scheduled metoprolol>>started amiodarone drip due to intermittent Bradycardia, dc'd thioridizine (QT prolongation) 3/23: DNR/DNI, ECHO 55-60%, mild LVH  ABX/Culture Vanc (3/18)>>  Primaxin (3/18)>>  Resp Cx (needs to be collected)>> Urine Cx (3/18)>>No growth  Blood cx (3/18)>>NGTD Resp swab (3/19)>>negative MRSA Screen Positive  Assessment and Plan: [name] [name] is a 79 y.o. male presenting with respiratory distress secondary to suspected aspiration PNA . PMH is significant for MR, COPD/Asthma, tobacco abuse.   #Severe Sepsis secondary to HCAP/Aspiration PNA w/ Acute Respiratory Distress: patient is improved. If patient does not stabilize in the next 24-48 hours would convert to full comfort.  - Palliative: Morphine PRN for severe respiratory distress, not favor putting PANDA    - CCM re-consulted: serial CXR, bronchodilators scheduled, cont ABX.  - vanc and primaxin - Tylenol for fever, none in >24 hours  - Duoneb Q6 PRN  - Speech Re-eval for swallow function: To re-evaluate   #Volume excess, Small Right pleural effusion: Net -3.0 L since admission with -3.4 in last 24 hours  - IV solumedrol 40 mg  - IV Lasix 40 mg BID   #Tachycardia - Intermittent Atrial flutter with RVR, likely  related to respiratory distress - Cardiology consulted, appreciate recs  - Amiodarone during hospitalization, to be weaned as respiratory status improves - not a candidate for long-term anticoagulation - ECHO EF  55-60%, mild LVH  #Mixed Respiratory Acidosis (CO2 retention), Metabolic acidosis (elevated lactic acid), and Metabolic Alkalosis (vomiting):  improved. Still providing [place].   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  - Holding oral meds now due to being NPO for concern of aspiration risk, continue home medications including Latuda,  - complicates eval of his MS as baseline is unclear, per nursing he spoketo family at bedside yesterday  #Diarrhea: resolved . Most likely a viral gastro.  - C. Diff: negative.  - Gi panel: negative  - Holding Laxatives   # Hx of HTN: blood pressures improved  - Holding home Metoprolol/Dyazide   FEN/GI: KVO fluids, NPO Prophylaxis: Heparin SQ  Disposition: pending improvement   Subjective: patient is looking well today. He is off of his [place] and breathing normally.   Objective: Temp:  [97.5 F (36.4 C)-98.4 F (36.9 C)] 97.6 F (36.4 C) (03/24 0335) Pulse Rate:  [63-240] 71 (03/24 0150) Resp:  [15-22] 17 (03/24 0150) BP: (134-161)/(68-87) 134/78 mmHg (03/24 0335) SpO2:  [88 %-100 %] 93 % (03/24 0335) Weight:  [141 lb 1.5 oz (64 kg)] 141 lb 1.5 oz (64 kg) (  03/24 0335) Physical Exam: General: NAD, alert, not responsive to questioning HEENT: abrasion on mid forehead Cardiovascular:Regular rate and rhythm, difficult to hear over loud coarse breathing Respiratory: improved breath sounds, normal WOB    Abdomen: Soft/NT/ND, + BS Extremities: trace pedal edema, + 2 DP pulses Skin: Intact, dry, warm  Neuro: No focal deficits, appears to be at baseline level of function  Laboratory:  Recent Labs Lab 04/10/13 0302 04/11/13 0430 04/13/13 0350  WBC 13.4* 8.9 13.8*  HGB 10.9* 11.5* 12.3*  HCT 32.4* 33.9* 36.4*  PLT 239 259 316    Recent Labs Lab 04/06/13 1514  04/08/13 0428  04/11/13 1640 04/12/13 0336 04/13/13 0350  NA 137  < > 141  < > 143 144 147  K 3.7  < > 3.2*  < > 3.6* 4.0 4.2  CL 99  < > 103  < > 103 105 102  CO2 25  < > 22  < > 24 25 29   BUN 24*  < > 21  < > 23 25* 28*  CREATININE 1.06  < > 0.87  < > 0.65 0.65 0.70  CALCIUM 8.7  < > 7.6*  < > 8.6 8.5 8.6  PROT 7.7  --  6.1  --   --   --   --   BILITOT 0.3  --  0.3  --   --   --   --   ALKPHOS 92  --  59  --   --   --   --   ALT 20  --  38  --   --   --   --   AST 37  --  57*  --   --   --   --   GLUCOSE 85  < > 66*  < > 175* 172* 149*  < > = values in this interval not displayed.  Mg: 1.5 (nml)  Phos: 2.89 (nml)  Lactic Acid: 2.75>>1.3 (nml)  Cortisol: 30.1 (nml)  TSH: 0.984 (nml)  Amylase: 183 (H) Lipase: 27 (nml)  LDH: 262 (h)  C. Diff: negative  RSV: pending Legionella, urinary: pending  Step pneumo, Ag: Negative  MRSA: positive  HIV: NR  Pro BNP: 415.1 (nml)  GI path panel (3/17)>>negative     Recent Labs Lab 04/07/13 0810  TROPONINI <0.30    Recent Labs Lab 04/07/13 1009  PHART 7.327*  PCO2ART 50.0*  PO2ART 238.0*  HCO3 25.6*  TCO2 27  O2SAT 100.0   Urinalysis    Component Value Date/Time   COLORURINE YELLOW 04/07/2013 0905   APPEARANCEUR CLOUDY* 04/07/2013 0905   LABSPEC 1.021 04/07/2013 0905   PHURINE 5.5 04/07/2013 0905   GLUCOSEU NEGATIVE 04/07/2013 0905   HGBUR NEGATIVE 04/07/2013 0905   BILIRUBINUR NEGATIVE 04/07/2013 0905   KETONESUR NEGATIVE 04/07/2013 0905   PROTEINUR 30* 04/07/2013 0905   UROBILINOGEN 0.2 04/07/2013 0905   NITRITE NEGATIVE 04/07/2013 0905   LEUKOCYTESUR  NEGATIVE 04/07/2013 0905    Imaging/Diagnostic Tests: CXR 04/07/13  Increased density predominantly at the left lung base is consistent with acute atelectasis or pneumonia. There are chronic fibrotic changes in both lungs. There is no evidence of pulmonary edema.  CT head 04/07/13:  IMPRESSION:  No acute intracranial pathology.  CXR 04/07/13 IMPRESSION:  Persistent asymmetric left basilar airspace disease suspicious for pneumonia or aspiration.  CXR 04/08/13 1. Similar appearance of patchy left basilar airspace disease, suspicious for pneumonia.  2. Small right pleural effusion, new relative to prior study. No pulmonary  edema.  CXR 3/22 IMPRESSION:  Multifocal patchy opacities, suspicious for pneumonia, increased.  Suspected small left pleural effusion.   Myra Rude, MD 04/13/2013, 6:29 AM PGY-1, [facility] Family Medicine FPTS Intern pager: [phone], text pages welcome

## 2013-04-13 NOTE — Progress Notes (Signed)
RT sterile NTS patient. Suctioned mod amount of thick, white secretions.   Patient tolerated well, sat 93%, HR 70.

## 2013-04-13 NOTE — Progress Notes (Signed)
FMTS Attending Daily Note: Sara Neal MD [phone] pager office [phone] I  have seen and examined this patient, reviewed their chart. I have discussed this patient with the resident. I agree with the resident's findings, assessment and care plan. 

## 2013-04-13 NOTE — Progress Notes (Signed)
Pt transferred via this nurse and NT to [id number]

## 2013-04-13 NOTE — Progress Notes (Signed)
Pt transferred to the unit. Pt is stable, alert and oriented per baseline. Oriented to room, staff, and call bell. Educated to call for any assistance. Patient does have history of mental retardation- will keep call bell close. Bed in lowest position, call bell within reach- will continue to monitor.

## 2013-04-13 NOTE — Progress Notes (Signed)
PULMONARY / CRITICAL CARE MEDICINE   Name: [name] [name] MRN: [id number] DOB: [birth year]    ADMISSION DATE:  04/07/2013 CONSULTATION DATE:  04/07/13  REFERRING MD :  FPTS  CHIEF COMPLAINT:  resp distress, PNA, sepsis  BRIEF PATIENT DESCRIPTION:  79 yo male smoker with n/v and respiratory distress with concern for aspiration pneumonia.  Has hx of COPD, mental retardation, psychosis.  PCCM reconsulted 3/21 after signing off 3/19 to assess for resp distress  SIGNIFICANT EVENTS: 3/18 Admit  STUDIES:  3/18 CT head >> no acute findings  LINES / TUBES: PIV  CULTURE: Respiratory viral panel 3/18 >>neg C diff PCR 3/18 >> negative Blood 3/18 >> neg Urine 3/18 >> negative  ANTIBIOTICS: Primaxin 3/18 >> Vancomycin 3/18 >>   SUBJECTIVE:  Mild resp distress.  I>>>O  HR elevated  VITAL SIGNS: Temp:  [97.1 F (36.2 C)-98.2 F (36.8 C)] 97.1 F (36.2 C) (03/24 1144) Pulse Rate:  [63-240] 66 (03/24 1144) Resp:  [15-19] 15 (03/24 1144) BP: (134-161)/(68-87) 157/84 mmHg (03/24 1144) SpO2:  [88 %-100 %] 91 % (03/24 1144) Weight:  [141 lb 1.5 oz (64 kg)] 141 lb 1.5 oz (64 kg) (03/24 0335) INTAKE / OUTPUT: Intake/Output     03/23 0701 - 03/24 0700 03/24 0701 - 03/25 0700   I.V. (mL/kg) 373.8 (5.8)    IV Piggyback 200    Total Intake(mL/kg) 573.8 (9)    Urine (mL/kg/hr) 3425 (2.2) 200 (0.5)   Total Output 3425 200   Net -2851.2 -200        Urine Occurrence 2 x    Stool Occurrence 1 x      PHYSICAL EXAMINATION: General: mild respiratory distress with increased upper airway secretion Neuro: follows simple commands, tremulous jaw, moves all extremities HEENT: poor dentition Cardiovascular: regular, tachycardic Lungs: Significant upper airway sounds audible in all long fields Abdomen: soft, non tender Musculoskeletal:  No edema Skin:  No rash  LABS: CBC  Recent Labs Lab 04/10/13 0302 04/11/13 0430 04/13/13 0350  WBC 13.4* 8.9 13.8*  HGB 10.9* 11.5* 12.3*  HCT 32.4*  33.9* 36.4*  PLT 239 259 316   BMET  Recent Labs Lab 04/11/13 1640 04/12/13 0336 04/13/13 0350  NA 143 144 147  K 3.6* 4.0 4.2  CL 103 105 102  CO2 24 25 29   BUN 23 25* 28*  CREATININE 0.65 0.65 0.70  GLUCOSE 175* 172* 149*   Electrolytes  Recent Labs Lab 04/08/13 0140  04/08/13 1250  04/10/13 2223  04/11/13 1640 04/12/13 0336 04/13/13 0350  CALCIUM 7.6*  < > 7.7*  < > 8.0*  < > 8.6 8.5 8.6  MG 1.5  --  2.3  --  1.8  --   --   --   --   PHOS 2.8  --   --   --   --   --   --   --   --   < > = values in this interval not displayed. Sepsis Markers  Recent Labs Lab 04/07/13 0823 04/07/13 1920  LATICACIDVEN 2.75* 1.3   ABG  Recent Labs Lab 04/07/13 1009  PHART 7.327*  PCO2ART 50.0*  PO2ART 238.0*   Liver Enzymes  Recent Labs Lab 04/06/13 1514 04/08/13 0428  AST 37 57*  ALT 20 38  ALKPHOS 92 59  BILITOT 0.3 0.3  ALBUMIN 3.5 2.5*   Cardiac Enzymes  Recent Labs Lab 04/07/13 0810  TROPONINI <0.30  PROBNP 415.1   Imaging:  3/19 CXR>  IMPRESSION:  1. Similar appearance of patchy left basilar airspace disease, suspicious for pneumonia.  2. Small right pleural effusion, new relative to prior study. No pulmonary edema.   3/21 CXR>  IMPRESSION:  Multifocal patchy opacities, suspicious for pneumonia, increased. Suspected small left pleural effusion.  ASSESSMENT / PLAN:  A: Acute respiratory failure 2nd to probable aspiration PNA vs HCAP. Volume excess, not getting BDs on schedule. Small Rt pleural effusion. Hx of COPD. P: - BDs on schedule and intensifiy - Keep as dry as able. - D/C further CXR at this point, will not alter management. - IV solumedrol ([id number]), may begin tapering at anytime. - Abx per primary. - Frequent secretion suctioning. - Continue step down level of care due to secretion control unless becomes full comfort. - Patient is a full DNR at this point.  Will likely progress to comfort care over the next couple of days.   Little else for PCCM to offer at this point, will sign off, please call back if needed.  Alyson Reedy, M.D. [facility] Pulmonary/Critical Care Medicine. Pager: [phone]. After hours pager: [phone].

## 2013-04-14 ENCOUNTER — Inpatient Hospital Stay (HOSPITAL_COMMUNITY): Payer: Medicare Other

## 2013-04-14 LAB — GLUCOSE, CAPILLARY: Glucose-Capillary: 174 mg/dL — ABNORMAL HIGH (ref 70–99)

## 2013-04-14 MED ORDER — ENSURE PUDDING PO PUDG
1.0000 | Freq: Three times a day (TID) | ORAL | Status: DC
  Administered 2013-04-14 – 2013-04-19 (×16): 1 via ORAL

## 2013-04-14 MED ORDER — PANTOPRAZOLE SODIUM 40 MG PO TBEC
40.0000 mg | DELAYED_RELEASE_TABLET | Freq: Every day | ORAL | Status: DC
  Administered 2013-04-14 – 2013-04-15 (×2): 40 mg via ORAL
  Filled 2013-04-14 (×4): qty 1

## 2013-04-14 MED ORDER — METHYLPREDNISOLONE SODIUM SUCC 40 MG IJ SOLR
10.0000 mg | Freq: Every day | INTRAMUSCULAR | Status: DC
  Administered 2013-04-15: 10 mg via INTRAVENOUS
  Filled 2013-04-14: qty 0.25

## 2013-04-14 NOTE — Procedures (Signed)
Objective Swallowing Evaluation: Modified Barium Swallowing Study  Patient Details  Name: [name] [name] MRN: [id number] Date of Birth: January 29, 1934  Today's Date: 04/14/2013 Time: [id number] SLP Time Calculation (min): 30 min  Past Medical History:  Past Medical History  Diagnosis Date  . COPD (chronic obstructive pulmonary disease)   . History of stomach ulcers   . Retardation   . Anemia   . Hypertension   . GERD (gastroesophageal reflux disease)   . Dementia    Past Surgical History:  Past Surgical History  Procedure Laterality Date  . Gastrectomy     HPI:  [name] [name] is a 79 y.o. male presenting with nausea vomiting and respiratory distress found to have aspiration PNA. Pt seen last night in [facility] due to diarrhea, had multiple evaluations performed, and GI panel pending, was dx and sent back to his group home. Patient brought to the ER by ambulance this AM from this group home due to respiratory distress and lethargy. Patient reportedly had nausea, vomiting and diarrhea through the night. This morning he was lethargic and struggling to breathe, EMS was called. They report oxygen saturation 67% on room air upon arrival, was started on NRB and had improvement on his O2. IN ed received aggressive volume and responded to volume. Still being treated with NIMV.      Assessment / Plan / Recommendation Clinical Impression  Dysphagia Diagnosis: Moderate oral phase dysphagia;Moderate pharyngeal phase dysphagia Clinical impression: Pt demonstrates a moderate oral and ororpharyngeal phase dysphagia with poor lingual coordination with lingual rocking movement leading to premature spillage to pharynx and residue in the lateral sulci. Oral function is much improved with use of straw, and also likely improved with more motivating flavors and textures based on presentation. Liquids spill to pyriforms and swallow response is significantly delayed with teaspoon and cup presentations. There is diffuse  weakness of the hyolaryngeal mechanism with moderate residuals. Pt cannot be cued for a second swallow or protective cough/throat clear.   The best strategy/diet appears to be nectar thick liquids via straw, which elicits a faster swallow trigger from pt. Alternating soft solids and liquids is also helpful to transit residuals. Pt must not have thin liquids via straw which leads to significant aspiration before the swallow. Will follow for tolerance.     Treatment Recommendation  Therapy as outlined in treatment plan below    Diet Recommendation Dysphagia 2 (Fine chop);Nectar-thick liquid   Liquid Administration via: Straw Medication Administration: Whole meds with puree Supervision: Full supervision/cueing for compensatory strategies;Staff to assist with self feeding Compensations: Slow rate;Small sips/bites Postural Changes and/or Swallow Maneuvers: Seated upright 90 degrees    Other  Recommendations Oral Care Recommendations: Oral care BID Other Recommendations: Order thickener from pharmacy   Follow Up Recommendations  Skilled Nursing facility    Frequency and Duration min 2x/week  2 weeks   Pertinent Vitals/Pain NA    SLP Swallow Goals     General HPI: [name] [name] is a 79 y.o. male presenting with nausea vomiting and respiratory distress found to have aspiration PNA. Pt seen last night in [facility] due to diarrhea, had multiple evaluations performed, and GI panel pending, was dx and sent back to his group home. Patient brought to the ER by ambulance this AM from this group home due to respiratory distress and lethargy. Patient reportedly had nausea, vomiting and diarrhea through the night. This morning he was lethargic and struggling to breathe, EMS was called. They report oxygen saturation 67% on room air upon arrival,  was started on NRB and had improvement on his O2. IN ed received aggressive volume and responded to volume. Still being treated with NIMV.  Type of Study: Modified  Barium Swallowing Study Reason for Referral: Objectively evaluate swallowing function Diet Prior to this Study: Dysphagia 2 (chopped);Nectar-thick liquids Temperature Spikes Noted: No Respiratory Status: Nasal cannula History of Recent Intubation: No Behavior/Cognition: Alert;Requires cueing;Confused Oral Cavity - Dentition: Missing dentition;Poor condition Oral Motor / Sensory Function: Impaired motor (Impaired lingual coordination/strength) Self-Feeding Abilities: Able to feed self;Needs assist Patient Positioning: Upright in chair Baseline Vocal Quality: Wet Volitional Cough: Cognitively unable to elicit Volitional Swallow: Unable to elicit Anatomy: Within functional limits    Reason for Referral Objectively evaluate swallowing function   Oral Phase Oral Preparation/Oral Phase Oral Phase: Impaired Oral - Nectar Oral - Nectar Cup: Weak lingual manipulation;Incomplete tongue to palate contact;Reduced posterior propulsion;Lingual/palatal residue;Delayed oral transit (lingual rocking) Oral - Nectar Straw: Lingual/palatal residue Oral - Thin Oral - Thin Teaspoon: Lingual/palatal residue;Reduced posterior propulsion;Weak lingual manipulation Oral - Thin Cup: Weak lingual manipulation;Incomplete tongue to palate contact;Reduced posterior propulsion;Lingual/palatal residue;Delayed oral transit Oral - Thin Straw: Weak lingual manipulation;Incomplete tongue to palate contact;Reduced posterior propulsion;Lingual/palatal residue;Delayed oral transit Oral - Solids Oral - Puree: Weak lingual manipulation;Incomplete tongue to palate contact;Reduced posterior propulsion Oral - Mechanical Soft: Weak lingual manipulation;Lingual/palatal residue;Delayed oral transit   Pharyngeal Phase Pharyngeal Phase Pharyngeal Phase: Impaired Pharyngeal - Nectar Pharyngeal - Nectar Cup: Premature spillage to pyriform sinuses;Delayed swallow initiation;Reduced pharyngeal peristalsis;Reduced epiglottic  inversion;Reduced anterior laryngeal mobility;Reduced laryngeal elevation;Reduced tongue base retraction;Penetration/Aspiration during swallow;Pharyngeal residue - valleculae;Pharyngeal residue - pyriform sinuses Penetration/Aspiration details (nectar cup): Material enters airway, remains ABOVE vocal cords and not ejected out;Material enters airway, CONTACTS cords and not ejected out Pharyngeal - Nectar Straw: Reduced laryngeal elevation;Reduced tongue base retraction;Reduced epiglottic inversion;Premature spillage to pyriform sinuses;Reduced pharyngeal peristalsis;Pharyngeal residue - valleculae;Pharyngeal residue - pyriform sinuses Penetration/Aspiration details (nectar straw): Material does not enter airway Pharyngeal - Thin Pharyngeal - Thin Teaspoon: Premature spillage to pyriform sinuses;Delayed swallow initiation;Reduced pharyngeal peristalsis;Reduced epiglottic inversion;Reduced anterior laryngeal mobility;Reduced laryngeal elevation;Reduced tongue base retraction;Penetration/Aspiration during swallow;Pharyngeal residue - valleculae;Pharyngeal residue - pyriform sinuses Penetration/Aspiration details (thin teaspoon): Material does not enter airway Pharyngeal - Thin Cup: Premature spillage to pyriform sinuses;Delayed swallow initiation;Reduced pharyngeal peristalsis;Reduced epiglottic inversion;Reduced anterior laryngeal mobility;Reduced laryngeal elevation;Pharyngeal residue - valleculae;Pharyngeal residue - pyriform sinuses;Reduced tongue base retraction;Penetration/Aspiration during swallow Penetration/Aspiration details (thin cup): Material enters airway, remains ABOVE vocal cords and not ejected out Pharyngeal - Thin Straw: Premature spillage to pyriform sinuses;Delayed swallow initiation;Reduced pharyngeal peristalsis;Reduced epiglottic inversion;Reduced anterior laryngeal mobility;Reduced laryngeal elevation;Pharyngeal residue - valleculae;Pharyngeal residue - pyriform sinuses;Reduced tongue  base retraction;Penetration/Aspiration before swallow Penetration/Aspiration details (thin straw): Material enters airway, passes BELOW cords and not ejected out despite cough attempt by patient Pharyngeal - Solids Pharyngeal - Puree: Premature spillage to valleculae;Reduced pharyngeal peristalsis;Reduced tongue base retraction;Reduced epiglottic inversion;Pharyngeal residue - valleculae;Pharyngeal residue - pyriform sinuses Pharyngeal - Mechanical Soft: Premature spillage to valleculae;Reduced pharyngeal peristalsis;Reduced tongue base retraction;Reduced epiglottic inversion;Pharyngeal residue - valleculae;Pharyngeal residue - pyriform sinuses  Cervical Esophageal Phase    GO              Gearldine Looney, Riley Nearing 04/14/2013, 10:37 AM

## 2013-04-14 NOTE — Progress Notes (Signed)
PCP Note   As patient's PCP I stopped by to visit him today. This afternoon, he was sleeping peacefully and in no acute distress. I spoke with his nurse and the inpatient team about his medical course up to this date and plans going forward. I will be happy to resume care at the hospital follow up appointment if he becomes stable enough for discharge. I appreciate the efforts of the entire nursing and medical team involved in his care.   Jacquelin Hawkingalph Asencion Guisinger, MD [facility], PGY-1 04/14/2013 1:07 PM

## 2013-04-14 NOTE — Progress Notes (Signed)
Family Medicine Teaching Service Daily Progress Note Intern Pager: [phone]  Patient name: [name] [name] Medical record number: [id number] Date of birth: 1934-07-01 Age: 79 y.o. Gender: male  Primary Care Provider: Jacquelin Hawking, MD Consultants: CCM Code Status: Full (DSS next of contact)  Pt Overview and Major Events to Date:  3/18: admitted for respiratory distress, on Bipap, Zenaida Niece and Primaxin for HCAP coverage 3/18: Taken off BiPAP by CCM, placed on [place]  3/19: had episodes of torsades, potassium still low, Mg and Phos normal 3/19: improved work of breathing  3/20: palliative care meeting set for Monday 3/23 3/21: Atrial tachyarrythmia, overnight tachy/brady to 30s, CCM re-consulted,  3/22: scheduled metoprolol>>started amiodarone drip due to intermittent Bradycardia, dc'd thioridizine (QT prolongation) 3/23: DNR/DNI, ECHO 55-60%, mild LVH 3/24: transitioned ABX, transferred out of step down .  ABX/Culture Vanc (3/18)>> 3/24 Primaxin (3/18)>> 3/24  Azithromycin (3/24) >>  Resp Cx (needs to be collected)>> Urine Cx (3/18)>>No growth  Blood cx (3/18)>>NG Resp swab (3/19)>>negative MRSA Screen Positive  Assessment and Plan: [name] [name] is a 79 y.o. male presenting with respiratory distress secondary to suspected aspiration PNA . PMH is significant for MR, COPD/Asthma, tobacco abuse.   #Severe Sepsis secondary to HCAP/Aspiration PNA w/ Acute Respiratory Distress: patient is improved. If patient does not stabilize in the next 24-48 hours would convert to full comfort.  - Palliative: appreciate recommendations. Morphine PRN for severe respiratory distress, not favor putting PANDA    - CCM signed off  - Tylenol for fever, none in >24 hours  - Duoneb Q6 PRN  - Speech Re-eval: dys 2   #Volume excess, Small Right pleural effusion: Net -5.0 L since admission. 141>>138 lbs  - IV solumedrol 40 mg >>may begin tapering  - IV Lasix 40 mg daily   #Tachycardia - Intermittent Atrial  flutter with RVR, likely related to respiratory distress - Cardiology consulted, appreciate recs  - Amiodarone 400 mg PO during hospitalization; Would decrease to 200 mg daily in 2 weeks - continue lasix   #Mixed Respiratory Acidosis (CO2 retention), Metabolic acidosis (elevated lactic acid), and Metabolic Alkalosis (vomiting):  improved. Still providing [place].   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  - Holding oral meds now due to being NPO for concern of aspiration risk, continue home medications including Latuda,  - complicates eval of his MS as baseline is unclear, per nursing he spoketo family at bedside yesterday  #Diarrhea: resolved . Most likely a viral gastro.  - C. Diff: negative.  - Gi panel: negative  - Holding Laxatives   # Hx of HTN: blood pressures improved  - Holding home Metoprolol/Dyazide   FEN/GI: KVO fluids, dys 2  Prophylaxis: Heparin SQ  Disposition: pending improvement   Subjective: Patient is unchanged from yesterday. He still has [place] for support.   Objective: Temp:  [97.1 F (36.2 C)-98.3 F (36.8 C)] 98.3 F (36.8 C) (03/25 0552) Pulse Rate:  [63-74] 63 (03/25 0631) Resp:  [14-18] 18 (03/25 0552) BP: (110-157)/(63-84) 144/75 mmHg (03/25 0631) SpO2:  [91 %-95 %] 91 % (03/25 0552) Weight:  [138 lb 3.7 oz (62.7 kg)] 138 lb 3.7 oz (62.7 kg) (03/24 2058) Physical  Exam: General: NAD, alert, HEENT: abrasion on mid forehead Cardiovascular:Regular rate and rhythm, difficult to hear over loud coarse breathing Respiratory: improved breath sounds, normal WOB   Abdomen: Soft/NT/ND, + BS Extremities: trace pedal edema, + 2  DP pulses Skin: Intact, dry, warm  Neuro: No focal deficits, appears to be at baseline level of function  Laboratory:  Recent Labs Lab 04/10/13 0302 04/11/13 0430 04/13/13 0350  WBC 13.4* 8.9 13.8*  HGB 10.9* 11.5* 12.3*  HCT 32.4* 33.9* 36.4*  PLT 239 259 316    Recent Labs Lab 04/08/13 0428  04/11/13 1640 04/12/13 0336 04/13/13 0350  NA 141  < > 143 144 147  K 3.2*  < > 3.6* 4.0 4.2  CL 103  < > 103 105 102  CO2 22  < > 24 25 29   BUN 21  < > 23 25* 28*  CREATININE 0.87  < > 0.65 0.65 0.70  CALCIUM 7.6*  < > 8.6 8.5 8.6  PROT 6.1  --   --   --   --   BILITOT 0.3  --   --   --   --   ALKPHOS 59  --   --   --   --   ALT 38  --   --   --   --   AST 57*  --   --   --   --   GLUCOSE 66*  < > 175* 172* 149*  < > = values in this interval not displayed.  Mg: 1.5 (nml)  Phos: 2.89 (nml)  Lactic Acid: 2.75>>1.3 (nml)  Cortisol: 30.1 (nml)  TSH: 0.984 (nml)  Amylase: 183 (H) Lipase: 27 (nml)  LDH: 262 (h)  C. Diff: negative  RSV: pending Legionella, urinary: pending  Step pneumo, Ag: Negative  MRSA: positive  HIV: NR  Pro BNP: 415.1 (nml)  GI path panel (3/17)>>negative    No results found for this basename: TROPONINI,  in the last 168 hours  Recent Labs Lab 04/07/13 1009  PHART 7.327*  PCO2ART 50.0*  PO2ART 238.0*  HCO3 25.6*  TCO2 27  O2SAT 100.0   Urinalysis    Component Value Date/Time   COLORURINE YELLOW 04/07/2013 0905   APPEARANCEUR CLOUDY* 04/07/2013 0905   LABSPEC 1.021 04/07/2013 0905   PHURINE 5.5 04/07/2013 0905   GLUCOSEU NEGATIVE 04/07/2013 0905   HGBUR NEGATIVE 04/07/2013 0905   BILIRUBINUR NEGATIVE 04/07/2013 0905   KETONESUR NEGATIVE 04/07/2013 0905   PROTEINUR 30* 04/07/2013 0905   UROBILINOGEN 0.2 04/07/2013 0905   NITRITE NEGATIVE 04/07/2013 0905   LEUKOCYTESUR NEGATIVE 04/07/2013 0905    Imaging/Diagnostic Tests: CXR 04/07/13  Increased density predominantly at the left lung base is consistent with acute atelectasis or pneumonia.  There are chronic fibrotic changes in both lungs. There is no evidence of pulmonary edema.  CT head 04/07/13:  IMPRESSION:  No acute intracranial pathology.  CXR 04/07/13 IMPRESSION:  Persistent asymmetric left basilar airspace disease suspicious for pneumonia or aspiration.  CXR 04/08/13 1. Similar appearance of patchy left basilar airspace disease, suspicious for pneumonia.  2. Small right pleural effusion, new relative to prior study. No pulmonary edema.  CXR 3/22 IMPRESSION:  Multifocal patchy opacities, suspicious for pneumonia, increased.  Suspected small left pleural effusion.   Myra RudeJeremy E Pluma Diniz, MD 04/14/2013, 8:16 AM PGY-1, [facility] FPTS Intern pager: [phone], text pages welcome

## 2013-04-14 NOTE — Progress Notes (Signed)
FMTS Attending Daily Note: Denny LevySara Kaleesi Guyton MD [phone] pager office [phone] I  have seen and examined this patient, reviewed their chart. I have discussed this patient with the resident. I agree with the resident's findings, assessment and care plan. Goals of care meeting via palliative care today.

## 2013-04-14 NOTE — Progress Notes (Signed)
NUTRITION FOLLOW-UP  DOCUMENTATION CODES Per approved criteria  -Non-severe (moderate) malnutrition in the context of chronic illness   Patient meets criteria for non-severe (moderate) malnutrition in the context of chronic illness as evidenced by moderate-severe muscle & subcutaneous fat loss.  INTERVENTION: Change Ensure Complete to Ensure Pudding. Continue Magic cup TID with meals, each supplement provides 290 kcal and 9 grams of protein. If aggressive nutrition interventions desired, please consult RD. RD to continue to follow nutrition care plan.  NUTRITION DIAGNOSIS: Inadequate oral intake related to poor appetite, acute illness as evidenced PO intake 10%. Ongoing.  Goal: Pt to meet >/= 90% of their estimated nutrition needs - unmet.  Monitor:  PO & supplemental intake, goals of care, weight, labs, I/O's  ASSESSMENT: 79 y/o male with PMH MR, Psychosis, COPD, Asthma, Tobacco abuse, GERD, and HTN presented with severe respiratory distress; concern for aspiration pneumonia; pt is from [place] group home.  Downgraded to Dysphagia 2 diet with nectar liquids by SLP on 3/20 secondary to ronchus breathing. MBSS on 3/25 with recommendations for continued Dysphagia 2 diet with nectar liquids. Pt is eating minimally, consuming 10% of meals.  Palliative Care Team consulted for goals of care. Team met with them on 3/23 - plan for continued ST treatment, per note "would not favor putting panda as pt will not understand and likely be combative." PCCM and cardiology have signed off.  Height: Ht Readings from Last 1 Encounters:  04/13/13 $RemoveB'5\' 11"'sbWRBmMA$  (1.803 m)    Weight: Wt Readings from Last 1 Encounters:  04/13/13 138 lb 3.7 oz (62.7 kg)  Admit wt 141 lb  BMI:  Body mass index is 19.29 kg/(m^2). WNL  Estimated Nutritional Needs: Kcal: 1700-1900 Protein: 80-90 gm Fluid: 1.7-1.9 L  Skin: Intact  Diet Order: Dysphagia 2; Nectar liquids  EDUCATION NEEDS: -No education needs  identified at this time   Intake/Output Summary (Last 24 hours) at 04/14/13 1134 Last data filed at 04/14/13 0553  Gross per 24 hour  Intake      0 ml  Output   1950 ml  Net  -1950 ml    Last BM: 3/23  Labs:   Recent Labs Lab 04/07/13 1920 04/08/13 0140  04/08/13 1250  04/10/13 2223  04/11/13 1640 04/12/13 0336 04/13/13 0350  NA 138 141  < > 139  < > 144  < > 143 144 147  K 3.3* 3.1*  < > 3.7  < > 3.4*  < > 3.6* 4.0 4.2  CL 100 102  < > 104  < > 103  < > 103 105 102  CO2 20 21  < > 21  < > 22  < > $R'24 25 29  'kk$ BUN 23 22  < > 18  < > 12  < > 23 25* 28*  CREATININE 0.94 0.92  < > 0.73  < > 0.65  < > 0.65 0.65 0.70  CALCIUM 7.8* 7.6*  < > 7.7*  < > 8.0*  < > 8.6 8.5 8.6  MG  --  1.5  --  2.3  --  1.8  --   --   --   --   PHOS  --  2.8  --   --   --   --   --   --   --   --   GLUCOSE 74 79  < > 94  < > 131*  < > 175* 172* 149*  < > = values in this interval not  displayed.  Scheduled Meds: . amiodarone  400 mg Oral Daily  . azithromycin  250 mg Oral Daily  . Chlorhexidine Gluconate Cloth  6 each Topical Q0600  . feeding supplement (ENSURE COMPLETE)  237 mL Oral BID BM  . furosemide  40 mg Intravenous Daily  . heparin  5,000 Units Subcutaneous 3 times per day  . hydrocerin   Topical BID  . ipratropium  0.5 mg Nebulization Q6H  . levalbuterol  0.63 mg Nebulization 4 times per day  . [START ON 04/15/2013] methylPREDNISolone (SOLU-MEDROL) injection  10 mg Intravenous Daily  . pantoprazole  40 mg Oral QHS    Continuous Infusions: . sodium chloride 1,000 mL (04/10/13 1520)    Inda Coke MS, RD, LDN Inpatient Registered Dietitian Pager: [phone] After-hours pager: [phone]

## 2013-04-14 NOTE — Clinical Social Work Note (Addendum)
Patient transferred to this CSW, report received from previous CSW. CSW spoke with patient's guardian Rhea BleacherJanise Davis (supervisor for [employer]) about patient's disposition. CSW reviewed PMT notes and notices that Ewing SchleinJanise has asked treatment team to make decision about what is best for the patient. CSW has confirmed this with Rhea BleacherJanise Davis who states, "I am going to want to move forward with the doctor's recommendation, whatever that may be, whatever is in his best interest." CSW is waiting for PMT recommendations. CSW notes that patient doesn't have a PASRR, CSW has submitted for this but it will likely be a Level II PASRR given his mental health history. If SNF will be patient's disposition, patient cannot be admitted to a SNF until this number is received. CSW spoke with patient's group home [facility]. Clemon ChambersHarold Craig, president of [facility] states that they have served the patient since 1997 and would have to look into whether or not the patient could return to group home with hospice services (if recommended). CSW will continue to follow for DC needs.   Roddie McBryant Delaney Schnick, DoonLCSWA, [place], [phone]

## 2013-04-14 NOTE — Progress Notes (Signed)
Pt transferred to 5W. I provided handoff to unit CSW. I am signing off.   Maryclare LabradorJulie Ger Nicks, MSW, [facility] Clinical Social Worker [phone]

## 2013-04-14 NOTE — Progress Notes (Signed)
Patient continues to take his oxygen off- dropping his o2 stats every hour. RN has replaced o2 and asked patient not to remove it. Will continue to monitor

## 2013-04-15 DIAGNOSIS — Z515 Encounter for palliative care: Secondary | ICD-10-CM

## 2013-04-15 MED ORDER — LORAZEPAM 2 MG/ML PO CONC: 0.5000 mg | ORAL | Status: DC | PRN

## 2013-04-15 MED ORDER — MORPHINE SULFATE (CONCENTRATE) 10 MG /0.5 ML PO SOLN: 5.0000 mg | ORAL | Status: DC | PRN

## 2013-04-15 MED ORDER — LEVALBUTEROL HCL 0.63 MG/3ML IN NEBU
0.6300 mg | INHALATION_SOLUTION | Freq: Three times a day (TID) | RESPIRATORY_TRACT | Status: DC
  Administered 2013-04-15 – 2013-04-19 (×14): 0.63 mg via RESPIRATORY_TRACT
  Filled 2013-04-15 (×29): qty 3

## 2013-04-15 MED ORDER — PREDNISONE 10 MG PO TABS
10.0000 mg | ORAL_TABLET | Freq: Every day | ORAL | Status: DC
  Administered 2013-04-16 – 2013-04-19 (×4): 10 mg via ORAL
  Filled 2013-04-15 (×5): qty 1

## 2013-04-15 MED ORDER — LOPERAMIDE HCL 2 MG PO CAPS: 2.0000 mg | ORAL_CAPSULE | Freq: Four times a day (QID) | ORAL | Status: DC | PRN

## 2013-04-15 MED ORDER — IPRATROPIUM BROMIDE 0.02 % IN SOLN
0.5000 mg | Freq: Three times a day (TID) | RESPIRATORY_TRACT | Status: DC
  Administered 2013-04-15 – 2013-04-19 (×14): 0.5 mg via RESPIRATORY_TRACT
  Filled 2013-04-15 (×14): qty 2.5

## 2013-04-15 MED ORDER — ACETAMINOPHEN 325 MG PO TABS: 650.0000 mg | ORAL_TABLET | Freq: Four times a day (QID) | ORAL | Status: DC | PRN

## 2013-04-15 NOTE — Clinical Social Work Note (Signed)
Residential hospice referrals made.  [name] [name], LCSWA, LCASA, [phone] 

## 2013-04-15 NOTE — Progress Notes (Addendum)
Palliative Medicine Team Progress Note  [name] [name] is minimally improved in terms of his overall condition and in terms of his prospect for regaining his functional status and QOL. He is non-verbal, bedbound, very decondition and weak. He has overt aspiration with food intake and is not able to take in enough nutrition to meet his needs. He has multiple chronic medical problems that are end stage including COPD, CHF and progressive dysphagia that is likely a prorgession of his MR with Dementia. He has been optimally treated this hospitalization and has completed 8 days of IV antibiotics. At this point I recommend a transition to full comfort care with a focus on QOL. DSS guardian endorsing palliative recommendations. He has a persistent multifocal PNA with hypoxia and likely irreversible dysphagia-results of the MBS noted and appreciated.  Recommendations:  1. DNR 2. No Feeding Tube, Comfort Feeds as tolerated with full supervision-I have liberalized diet- aspiration may be natural progression but he seems very comforted by being offered water (non-thickened) on my visit. He handled sips well. 3. Discontinue ABX he has finished 8 days- will not resume or re-initiate 4. De-escalated interventions for comfort- d/c tele, d/c continuous O2 sat monitor 5. Continue scheduled and PRN nebulizers, will change steroid and other meds to oral- if he can take them will continue if not, they will probably not contribute to comfort and not be indicated. 6. He is most appropriate for either Hospice at a SNF or a Hospice Facility- I anticipate a fairly rapid decline- he has been having issues with dyspnea with minimal exertion and hypoxia so a Hospice Facility would be the best place for him to get attention to those symptoms. Pain is also very difficult to assess in this patient who has MR and is mostly non-verbal -hospice teams are better positioned than SNF providers to assess and respond to these needs quickly to  maintain comfort. 7. Do not re-hospitalize, comfort primary goal  Attempted to contact DSS worker-await return call. No family at bedside. 12:30PM-13:05. 35 minutes.  Anderson MaltaElizabeth Minnetta Sandora, DO Palliative Medicine

## 2013-04-15 NOTE — Care Management Note (Signed)
    Page 1 of 2   04/19/2013     2:45:36 PM   CARE MANAGEMENT NOTE 04/19/2013  Patient:  [name] [name],[name] [name]   Account Number:  [id number]  Date Initiated:  04/12/2013  Documentation initiated by:  [name],[name]  Subjective/Objective Assessment:   dx sepsis; resident of group home     Action/Plan:   Palliative consult  speech consult   Anticipated DC Date:  04/16/2013   Anticipated DC Plan:  SKILLED NURSING FACILITY  In-house referral  Clinical Social Worker      DC Planning Services  CM consult      [facility]   Choice offered to / List presented to:  C-2 HC POA / Guardian           Status of service:  Completed, signed off Medicare Important Message given?   (If response is "NO", the following Medicare IM given date fields will be blank) Date Medicare IM given:   Date Additional Medicare IM given:    Discharge Disposition:  SKILLED NURSING FACILITY  Per UR Regulation:  Reviewed for med. necessity/level of care/duration of stay  If discussed at Long Length of Stay Meetings, dates discussed:   04/15/2013    Comments:  04/19/13 1443 [name] [name] Zamiya Dillard RN, BSN [phone] [id number] patient is not appropriate for residential hospice, patient received level 2 passar today and is for dc to snf with palliative. CSW following.   04/15/13 1226 [name] [name] [name] Dlugosz RN, BSN [phone] patient is ward of the state and they want palliative to make the decision that is best for the patient, whether it be residential hospiec, snf with hospice etc.  NCM and CSW awaiting palliative decision.  Decision is for Residential Hospice per CSW note.  04/12/13 1108 [name] Mayo RN MSN BSN CCM Palliative Care now following, has discussed prognosis with legal guardian.  IMTS will continue antibx/diuresis, palliative will watch for 24 hours - CSW aware pt may need residential hospice placement.

## 2013-04-15 NOTE — Consult Note (Signed)
[facility] Liaison:  Received request from CSW for [facility]. Chart reviewed. [facility] medical team reviewing clinical information. Will up CSW re availability. Thank you. Forrestine Himva Sachit Gilman LCSW [phone]

## 2013-04-15 NOTE — Progress Notes (Signed)
FMTS Attending Daily Note: Tippi Mccrae MD [phone] pager office [phone] I  have seen and examined this patient, reviewed their chart. I have discussed this patient with the resident. I agree with the resident's findings, assessment and care plan. 

## 2013-04-15 NOTE — Progress Notes (Signed)
Family Medicine Teaching Service Daily Progress Note Intern Pager: [phone]  Patient name: [name] [name] Medical record number: [id number] Date of birth: Apr 05, 1934 Age: 79 y.o. Gender: male  Primary Care Provider: Jacquelin Hawking, MD Consultants: CCM Code Status: Full (DSS next of contact)  Pt Overview and Major Events to Date:  3/18: admitted for respiratory distress, on Bipap, Zenaida Niece and Primaxin for HCAP coverage 3/18: Taken off BiPAP by CCM, placed on [place]  3/19: had episodes of torsades, potassium still low, Mg and Phos normal 3/19: improved work of breathing  3/20: palliative care meeting set for Monday 3/23 3/21: Atrial tachyarrythmia, overnight tachy/brady to 30s, CCM re-consulted,  3/22: scheduled metoprolol>>started amiodarone drip due to intermittent Bradycardia, dc'd thioridizine (QT prolongation) 3/23: DNR/DNI, ECHO 55-60%, mild LVH 3/24: transitioned ABX, transferred out of step down .  ABX/Culture Vanc (3/18)>> 3/24 Primaxin (3/18)>> 3/24  Azithromycin (3/24) >>  Resp Cx (needs to be collected)>> Urine Cx (3/18)>>No growth  Blood cx (3/18)>>NG Resp swab (3/19)>>negative MRSA Screen Positive  Assessment and Plan: [name] [name] is a 79 y.o. male presenting with respiratory distress secondary to suspected aspiration PNA . PMH is significant for MR, COPD/Asthma, tobacco abuse.   #Severe Sepsis secondary to HCAP/Aspiration PNA w/ Acute Respiratory Distress: Patient guardian is willing to follow the recommendations of the medical team. CSW is trying to find is patient's previous place of residence is capability of housing him or he needs a place of more tertiary care.  - Palliative: appreciate recommendations. Morphine PRN for severe respiratory distress, not favor putting PANDA    - CCM signed off  - Tylenol for fever, none in >24 hours  - Duoneb Q6 PRN  - Speech Re-eval: dys 2   #Volume excess, Small Right pleural effusion: Net -6.0 L since admission. 141>>138 lbs  - IV  solumedrol 40 mg >>may begin tapering  - IV Lasix 40 mg daily   #Tachycardia - Intermittent Atrial flutter with RVR, likely related to respiratory distress - Cardiology consulted, appreciate recs  - Amiodarone 400 mg PO during hospitalization; Would decrease to 200 mg daily in 2 weeks - continue lasix   #Mixed Respiratory Acidosis (CO2 retention), Metabolic acidosis (elevated lactic acid), and Metabolic Alkalosis (vomiting): unchanged.   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  - Holding oral meds now due to being NPO for concern of aspiration risk, continue home medications including Latuda,  - complicates eval of his MS as baseline is unclear, per nursing he spoketo family at bedside yesterday  #Diarrhea: resolved . Most likely a viral gastro.  - C. Diff: negative.  - Gi panel: negative  - Holding Laxatives   # Hx of HTN: blood pressures improved  - Holding home Metoprolol/Dyazide   FEN/GI: KVO fluids, dys 2  Prophylaxis: Heparin SQ  Disposition: pending improvement   Subjective: Patient is unchanged from yesterday. He still has [place] for support.   Objective: Temp:  [98.3 F (36.8 C)-98.4 F (36.9 C)] 98.3 F (36.8 C) (03/26 0525) Pulse Rate:  [62-79] 62 (03/26 0525) Resp:  [18-20] 20 (03/26 0525) BP: (104-126)/(66-69) 126/67 mmHg (03/26 0525) SpO2:  [92 %-96 %] 92 % (03/26 0725) Physical Exam:  General: NAD, alert, HEENT: abrasion on mid forehead Cardiovascular:Regular rate and rhythm, difficult to hear over loud coarse breathing Respiratory: improved breath sounds, normal WOB   Abdomen: Soft/NT/ND, + BS Extremities: trace pedal  edema, + 2 DP pulses Skin: Intact, dry, warm  Neuro: No focal deficits, appears to be at baseline level of function  Laboratory:  Recent Labs Lab 04/10/13 0302 04/11/13 0430 04/13/13 0350  WBC 13.4* 8.9 13.8*  HGB 10.9* 11.5* 12.3*  HCT 32.4* 33.9* 36.4*  PLT 239 259 316    Recent Labs Lab 04/11/13 1640 04/12/13 0336 04/13/13 0350  NA 143 144 147  K 3.6* 4.0 4.2  CL 103 105 102  CO2 24 25 29   BUN 23 25* 28*  CREATININE 0.65 0.65 0.70  CALCIUM 8.6 8.5 8.6  GLUCOSE 175* 172* 149*    Mg: 1.5 (nml)  Phos: 2.89 (nml)  Lactic Acid: 2.75>>1.3 (nml)  Cortisol: 30.1 (nml)  TSH: 0.984 (nml)  Amylase: 183 (H) Lipase: 27 (nml)  LDH: 262 (h)  C. Diff: negative  RSV: pending Legionella, urinary: pending  Step pneumo, Ag: Negative  MRSA: positive  HIV: NR  Pro BNP: 415.1 (nml)  GI path panel (3/17)>>negative    No results found for this basename: TROPONINI,  in the last 168 hours No results found for this basename: PHART, PCO2, PCO2ART, PO2, PO2ART, HCO3, TCO2, O2SAT,  in the last 168 hours Urinalysis    Component Value Date/Time   COLORURINE YELLOW 04/07/2013 0905   APPEARANCEUR CLOUDY* 04/07/2013 0905   LABSPEC 1.021 04/07/2013 0905   PHURINE 5.5 04/07/2013 0905   GLUCOSEU NEGATIVE 04/07/2013 0905   HGBUR NEGATIVE 04/07/2013 0905   BILIRUBINUR NEGATIVE 04/07/2013 0905   KETONESUR NEGATIVE 04/07/2013 0905   PROTEINUR 30* 04/07/2013 0905   UROBILINOGEN 0.2 04/07/2013 0905   NITRITE NEGATIVE 04/07/2013 0905   LEUKOCYTESUR NEGATIVE 04/07/2013 0905    Imaging/Diagnostic Tests: CXR 04/07/13  Increased density predominantly at the left lung base is consistent with acute atelectasis or pneumonia. There are chronic fibrotic changes in both lungs. There is no evidence of pulmonary edema.  CT head 04/07/13:  IMPRESSION:  No acute intracranial pathology.  CXR 04/07/13 IMPRESSION:  Persistent asymmetric left basilar airspace disease suspicious for pneumonia or aspiration.  CXR  04/08/13 1. Similar appearance of patchy left basilar airspace disease, suspicious for pneumonia.  2. Small right pleural effusion, new relative to prior study. No pulmonary edema.  CXR 3/22 IMPRESSION:  Multifocal patchy opacities, suspicious for pneumonia, increased.  Suspected small left pleural effusion.   Myra RudeJeremy E Charlet Harr, MD 04/15/2013, 8:35 AM PGY-1, [place] [facility] FPTS Intern pager: [phone], text pages welcome

## 2013-04-15 NOTE — Clinical Social Work Note (Signed)
CSW reviewed PMT's recommendations with Rhea BleacherJanise Davis [phone] (supervisor of guardian Michiel SitesJill Huegel), Ewing SchleinJanise states that she wants patient to go to residential hospice and has instructed CSW to make hospice referrals. CSW will make referrals.  Roddie McBryant Samaiyah Howes, [place]CSWA, [place], [phone]

## 2013-04-16 MED ORDER — TORSEMIDE 20 MG PO TABS
40.0000 mg | ORAL_TABLET | Freq: Every day | ORAL | Status: DC
  Administered 2013-04-16 – 2013-04-18 (×3): 40 mg via ORAL
  Filled 2013-04-16 (×3): qty 2

## 2013-04-16 NOTE — Progress Notes (Signed)
Family Medicine Teaching Service Daily Progress Note Intern Pager: [phone]  Patient name: [name] [name] Medical record number: [id number] Date of birth: May 29, 1934 Age: 79 y.o. Gender: male  Primary Care Provider: Jacquelin HawkingNettey, Ralph, MD Consultants: CCM Code Status: Full (DSS next of contact)  Pt Overview and Major Events to Date:  3/18: admitted for respiratory distress, on Bipap, Zenaida NieceVan and Primaxin for HCAP coverage 3/18: Taken off BiPAP by CCM, placed on [place]  3/19: had episodes of torsades, potassium still low, Mg and Phos normal 3/19: improved work of breathing  3/20: palliative care meeting set for Monday 3/23 3/21: Atrial tachyarrythmia, overnight tachy/brady to 30s, CCM re-consulted,  3/22: scheduled metoprolol>>started amiodarone drip due to intermittent Bradycardia, dc'd thioridizine (QT prolongation) 3/23: DNR/DNI, ECHO 55-60%, mild LVH 3/24: transitioned ABX, transferred out of step down . 3/26: DC ABX, Do not re-hospitalize, comfort primary goal   ABX/Culture Vanc (3/18)>> 3/24 Primaxin (3/18)>> 3/24  Azithromycin (3/24) >>3/26  Resp Cx (needs to be collected)>> Urine Cx (3/18)>>No growth  Blood cx (3/18)>>NG Resp swab (3/19)>>negative MRSA Screen Positive  Assessment and Plan: [name] [name] is a 79 y.o. male presenting with respiratory distress secondary to suspected aspiration PNA . PMH is significant for MR, COPD/Asthma, tobacco abuse.   #Severe Sepsis secondary to HCAP/Aspiration PNA w/ Acute Respiratory Distress: Patient is awaiting placement.  - Palliative: Appropriate for Hospice Facility, comfort is primary goal.  - Duoneb Q6 scheduled  - prednisone 10 mg (3/27)  #Volume excess, Small Right pleural effusion: Net -8.1 L since admission. 141>>138 lbs  - IV Lasix 40 mg daily>>dc today  - Torsemide 40 mg   #Tachycardia - Intermittent Atrial flutter with RVR, likely related to respiratory distress - Amiodarone 400 mg PO during hospitalization;  - Would decrease  to 200 mg daily in 2 weeks from (3/24)  - continue lasix   #Mixed Respiratory Acidosis (CO2 retention), Metabolic acidosis (elevated lactic acid), and Metabolic Alkalosis (vomiting): improved and stable.  - comfort care as above.    #Hypokalemia: Resolved - Had arrythmias possibly associated with hypoK earlier in admission  #Hx of COPD and Asthma - Most likely cause of his Chronic Respiratory Acidosis  - Atrovent TID   - pulmocort and PRN duoneb  #MR/Psychiatry Mood Disorder: may discontinue meds on discharge as he may be over medicated and contributing to his aspiration.  - lorazepam Q4PRN   #Diarrhea: resolved - Holding Laxatives   # Hx of HTN: blood pressures improved  - Holding home Metoprolol/Dyazide   FEN/GI: saline lock, dys 2  Prophylaxis: Heparin SQ  Disposition: pending improvement   Subjective: patient had no overnight events. He is eating and drinking.   Objective: Temp:  [98.5 F (36.9 C)-99.1 F (37.3 C)] 98.5 F (36.9 C) (03/27 0501) Pulse Rate:  [75-89] 75 (03/27 0501) Resp:  [14-20] 14 (03/27 0501) BP: (108-123)/(73-79) 123/73 mmHg (03/27 0501) SpO2:  [87 %-95 %] 87 % (03/27 0745) Physical Exam: General: NAD, alert, HEENT: abrasion on mid forehead Cardiovascular:Regular rate and rhythm, difficult to hear over loud coarse breathing Respiratory: improved breath sounds, normal WOB   Skin: Intact, dry, warm  Neuro: No focal deficits, appears to be at baseline level of function  Laboratory:  Recent Labs Lab 04/10/13 0302 04/11/13 0430 04/13/13 0350  WBC 13.4* 8.9 13.8*  HGB 10.9* 11.5* 12.3*  HCT 32.4* 33.9* 36.4*  PLT 239 259 316    Recent Labs Lab 04/11/13 1640 04/12/13 0336 04/13/13 0350  NA 143 144 147  K 3.6* 4.0  4.2  CL 103 105 102  CO2 24 25 29   BUN 23 25* 28*  CREATININE 0.65 0.65 0.70  CALCIUM 8.6 8.5 8.6  GLUCOSE 175* 172* 149*    Imaging/Diagnostic Tests: CXR 04/07/13  Increased density predominantly at the left lung  base is consistent with acute atelectasis or pneumonia. There are chronic fibrotic changes in both lungs. There is no evidence of pulmonary edema.  CT head 04/07/13:  IMPRESSION:  No acute intracranial pathology.  CXR 04/07/13 IMPRESSION:  Persistent asymmetric left basilar airspace disease suspicious for pneumonia or aspiration.  CXR 04/08/13 1. Similar appearance of patchy left basilar airspace disease, suspicious for pneumonia.  2. Small right pleural effusion, new relative to prior study. No pulmonary edema.  CXR 3/22 IMPRESSION:  Multifocal patchy opacities, suspicious for pneumonia, increased.  Suspected small left pleural effusion.   Myra Rude, MD 04/16/2013, 8:01 AM PGY-1, [facility] FPTS Intern pager: [phone], text pages welcome

## 2013-04-16 NOTE — Consult Note (Signed)
[facility] Liaison: Continue to follow for interest in [facility]. Unfortunately [facility] at this time and no availability expected over weekend. Will continue to follow until disposition determined. Thank you. Forrestine Himva Shaunae Sieloff LCSW [phone]

## 2013-04-16 NOTE — Progress Notes (Signed)
SLP Cancellation Note  Patient Details Name: [name] [name] MRN: [id number] DOB: [birth year]   Cancelled treatment:       Reason Eval/Treat Not Completed: Other (comment). Pt now on diet for full comfort. No SLP f/u needed at this time. Will sign off.   Syerra Abdelrahman, Riley NearingBonnie Caroline 04/16/2013, 11:30 AM

## 2013-04-16 NOTE — Progress Notes (Signed)
Seen and examined.  Discussed with Dr. Jordan LikesSchmitz.  Agree with his management and documentation.  I understand that he has been evaluated and is not a candidate [facility].  Alternative plan is to DC to SNF.  He is medically stable - considering we are aiming for comfort care.  I wonder if he is a candidate for GIP status since it may be some time before we have the Passar number for SNF.

## 2013-04-16 NOTE — Discharge Summary (Signed)
Family Medicine Teaching [facility] Discharge Summary  Patient name: [name] [name] Medical record number: [id number] Date of birth: 02/13/1934 Age: 79 y.o. Gender: male Date of Admission: 04/07/2013  Date of Discharge: 04/19/13 Admitting Physician: Uvaldo Rising, MD  Primary Care Provider: Jacquelin Hawking, MD Consultants: Palliative, cardiology, CCM,   Indication for Hospitalization: Respiratory Distress   Discharge Diagnoses/Problem List:  Patient Active Problem List   Diagnosis Date Noted  . Atrial flutter 04/11/2013  . Malnutrition of moderate degree 04/08/2013  . Aspiration pneumonia 04/07/2013  . Severe sepsis 04/07/2013  . Well adult exam 11/27/2012  . Fall on steps 12/12/2011  . Chest wall pain 12/12/2011  . Laceration of ear 05/08/2011  . Conjunctivitis 04/04/2011  . Allergic rhinitis 04/04/2010  . ECZEMA 12/01/2009  . TOBACCO USE, QUIT 07/07/2009  . BELLS PALSY 02/07/2009  . CHOLELITHIASIS 04/06/2008  . ESOPHAGEAL ULCER, WITH BLEEDING 08/17/2007  . BARRETTS ESOPHAGUS 08/17/2007  . GI BLEEDING 08/05/2007  . COPD 04/01/2007  . HYPERLIPIDEMIA 05/05/2006  . HYPOKALEMIA 05/05/2006  . ANEMIA, IRON DEFICIENCY NOS 05/05/2006  . PSYCHOSIS, UNSPECIFIED 03/20/2006  . MENTAL RETARDATION 03/20/2006  . HYPERTENSION, BENIGN SYSTEMIC 03/20/2006  . ASTHMA, INTERMITTENT 03/20/2006  . GASTROESOPHAGEAL REFLUX, NO ESOPHAGITIS 03/20/2006  . HERNIA, HIATAL, NONCONGENITAL 03/20/2006   Disposition: SNF with palliative care  Discharge Condition: stable   Brief Hospital Course:  [name] [name] is a 79 y.o. male presenting with respiratory distress secondary to suspected aspiration PNA . PMH is significant for MR, COPD/Asthma, tobacco abuse.   Patient presented with nausea, vomiting and respiratory distress. He was brought to the ER by ambulance from his group home.  He was lethargic with hypoxemia (67%) on room air. Multiple evaluations were performed and showed a pH of 7.327, pCO2  50.0, Bicarb of 25.6, LA of 2.75, WBC 13.4 w/ L shift, CXR w/ bibasilar patchy infiltrates L > R, AG of 16 w/ Bicarb of 26. He was admitted to step down and placed on BiPAP. He was started on [place] and primaxin (due to a PCN allergy).  He was diagnosed with sever sepsis secondary to HCAP/Aspiration PNA with a Mixed Respiratory Acidosis (CO2 retention), Metabolic acidosis (elevated lactic acid), and Metabolic Alkalosis (vomiting).  Patient was taken off BiPAP due to his aspiration risk.  His breathing status improved while being transitioned to [name]. His potassium was repleted as well as his fluids.  Patient was having episodes of tachycardia with intermittent atrial flutter with RVR.  His magnesium and phosphorus were repleted and no other events were recorded. Patient had a small pleural effusion and given lasix for symptomatic relief.   Due to patient's baseline being without capacity and being a ward of the state, palliative was consulted for goals of care.  Patient was transitioned to DNR/DNI and given a trial period to stabilize.  He was transitioned out of step down and to the med-surg floor. He was transitioned from vanc and primaxin to azithromycin.  He respiratory status waxed and waned but did not decompensate.  All of his cultures resulted as being negative.  The final goals of care are outlined below.   1. DNR  2. No Feeding Tube, Comfort Feeds as tolerated with full supervision- aspiration may be natural progression but he seems very comforted by being offered water (non-thickened) on my visit. He handled sips well.  3. Discontinue ABX he has finished 8 days- will not resume or re-initiate  4. De-escalated interventions for comfort- d/c tele, d/c continuous O2 sat monitor  5. Continue scheduled and PRN nebulizers, will change steroid and other meds to oral- if he can take them will continue if not, they will probably not contribute to comfort and not be indicated.  6. He is most appropriate for  either Hospice at a SNF or a Hospice Facility- I anticipate a fairly rapid decline- he has been having issues with dyspnea with minimal exertion and hypoxia so a Hospice Facility would be the best place for him to get attention to those symptoms. Pain is also very difficult to assess in this patient who has MR and is mostly non-verbal  7. Do not re-hospitalize, comfort primary goal 8. Patient should receive palliative care services at the SNF.  Issues for Follow Up:  1. Comfort care is the goal for [name] [name].  2. Patient will be on a prednisone taper.   5 mg for 5 more days.   2 mg for 5 days   1 mg for 5 days.   Significant Procedures: none  Significant Labs and Imaging:   Recent Labs Lab 04/13/13 0350  WBC 13.8*  HGB 12.3*  HCT 36.4*  PLT 316    Recent Labs Lab 04/13/13 0350  NA 147  K 4.2  CL 102  CO2 29  GLUCOSE 149*  BUN 28*  CREATININE 0.70  CALCIUM 8.6    Recent Labs  Lab  04/07/13 1009   PHART  7.327*   PCO2ART  50.0*   PO2ART  238.0*   HCO3  25.6*   TCO2  27   O2SAT  100.0    Urinalysis    Component Value Date/Time   COLORURINE YELLOW 04/07/2013 0905   APPEARANCEUR CLOUDY* 04/07/2013 0905   LABSPEC 1.021 04/07/2013 0905   PHURINE 5.5 04/07/2013 0905   GLUCOSEU NEGATIVE 04/07/2013 0905   HGBUR NEGATIVE 04/07/2013 0905   BILIRUBINUR NEGATIVE 04/07/2013 0905   KETONESUR NEGATIVE 04/07/2013 0905   PROTEINUR 30* 04/07/2013 0905   UROBILINOGEN 0.2 04/07/2013 0905   NITRITE NEGATIVE 04/07/2013 0905   LEUKOCYTESUR NEGATIVE 04/07/2013 0905   Resp Cx (needs to be collected)>>  Urine Cx (3/18)>>No growth  Blood cx (3/18)>>NG  Resp swab (3/19)>>negative  MRSA Screen Positive  Mg: 1.5 (nml)  Phos: 2.89 (nml)  Lactic Acid: 2.75>>1.3 (nml)  Cortisol: 30.1 (nml)  TSH: 0.984 (nml)  Amylase: 183 (H) Lipase: 27 (nml)  LDH: 262 (h)  C. Diff: negative  RSV: negative  Legionella, urinary: negative   Step pneumo, Ag: Negative  MRSA: positive  HIV: NR  Pro  BNP: 415.1 (nml)  GI path panel (3/17)>>negative   CXR 04/07/13  Increased density predominantly at the left lung base is consistent with acute atelectasis or pneumonia. There are chronic fibrotic changes in both lungs. There is no evidence of pulmonary edema.   CT head 04/07/13:  IMPRESSION:  No acute intracranial pathology.   CXR 04/07/13  IMPRESSION:  Persistent asymmetric left basilar airspace disease suspicious for pneumonia or aspiration.   CXR 04/08/13  1. Similar appearance of patchy left basilar airspace disease, suspicious for pneumonia.  2. Small right pleural effusion, new relative to prior study. No pulmonary edema.   CXR 3/22  IMPRESSION:  Multifocal patchy opacities, suspicious for pneumonia, increased.  Suspected small left pleural effusion.     Results/Tests Pending at Time of Discharge: none  Discharge Medications:    Medication List    STOP taking these medications       cetirizine 10 MG tablet  Commonly known as:  ZYRTEC  fluticasone 220 MCG/ACT inhaler  Commonly known as:  FLOVENT HFA     Ipratropium-Albuterol 20-100 MCG/ACT Aers respimat  Commonly known as:  COMBIVENT     lurasidone 80 MG Tabs tablet  Commonly known as:  LATUDA     metoprolol succinate 50 MG 24 hr tablet  Commonly known as:  TOPROL-XL     NAMENDA XR 28 MG Cp24  Generic drug:  Memantine HCl ER     simvastatin 20 MG tablet  Commonly known as:  ZOCOR     thioridazine 25 MG tablet  Commonly known as:  MELLARIL     triamterene-hydrochlorothiazide 37.5-25 MG per capsule  Commonly known as:  DYAZIDE      TAKE these medications       amiodarone 400 MG tablet  Commonly known as:  PACERONE  Take 1 tablet (400 mg total) by mouth daily.     budesonide 0.25 MG/2ML nebulizer solution  Commonly known as:  PULMICORT  Take 2 mLs (0.25 mg total) by nebulization 2 (two) times daily.     COLACE 100 MG capsule  Generic drug:  docusate sodium  Take 100 mg by mouth 2 (two) times  daily. Scheduled     ENSURE  Drink 237ml by mouth every other day     esomeprazole 40 MG capsule  Commonly known as:  NEXIUM  Take 40 mg by mouth 2 (two) times daily before a meal.     LORazepam 2 MG/ML concentrated solution  Commonly known as:  ATIVAN  Take 0.3 mLs (0.6 mg total) by mouth every 4 (four) hours as needed for anxiety, seizure, sedation or sleep.     morphine CONCENTRATE 10 mg / 0.5 ml concentrated solution  Take 0.25 mLs (5 mg total) by mouth every 2 (two) hours as needed for moderate pain, severe pain, anxiety or shortness of breath.     potassium chloride SA 20 MEQ tablet  Commonly known as:  K-DUR,KLOR-CON  Take 20 mEq by mouth every morning.     predniSONE 5 MG tablet  Commonly known as:  DELTASONE  - 5 mg daily with breakfast for 5 days  - 2 mg daily with breakfast for 5 days   - 1 mg daily with breakfast for 5 days.     PROAIR HFA 108 (90 BASE) MCG/ACT inhaler  Generic drug:  albuterol  Inhale 2 puffs into the lungs every 4 (four) hours as needed for wheezing or shortness of breath. Use with spacer     sucralfate 1 GM/10ML suspension  Commonly known as:  CARAFATE  Take 1 g by mouth 4 (four) times daily.        Discharge Instructions: Please refer to Patient Instructions section of EMR for full details.  Patient was counseled important signs and symptoms that should prompt return to medical care, changes in medications, dietary instructions, activity restrictions, and follow up appointments.   Follow-Up Appointments:   Myra RudeJeremy E Schmitz, MD 04/19/2013, 11:48 AM PGY-1, [facility]

## 2013-04-16 NOTE — Clinical Social Work Note (Addendum)
Per [facility] liaison, patient is not hospice approriate, and thinks SNF with palliative may be more appropriate for patient. Patient is being evaluated by PASRR today for his level II PASRR. Hopeful for SNF placement on Monday. CSW has updated guardian's supervisor Rhea BleacherJanise Davis.  Roddie McBryant Delphin Funes, [place], [place], [phone]

## 2013-04-16 NOTE — Progress Notes (Signed)
Nutrition Brief Note  Chart reviewed. Pt now transitioning to comfort care.  No further nutrition interventions warranted at this time.  Please re-consult as needed.   Junius Faucett MS, RD, LDN Inpatient Registered Dietitian Pager: [phone] After-hours pager: [phone]    

## 2013-04-17 NOTE — Progress Notes (Signed)
Seen and examined.  Discussed with Dr. Jordan LikesSchmitz.  Medically stable, awaiting SNF placement. Primary goal is comfort care.

## 2013-04-17 NOTE — Progress Notes (Signed)
Family Medicine Teaching Service Daily Progress Note Intern Pager: [phone]  Patient name: [name] PentaDonald Nienhuis Medical record number: [id number] Date of birth: [birth year] Age: 79 y.o. Gender: male  Primary Care Provider: Jacquelin HawkingNettey, Ralph, MD Consultants: CCM Code Status: Full (DSS next of contact)  Pt Overview and Major Events to Date:  3/18: admitted for respiratory distress, on Bipap, Zenaida NieceVan and Primaxin for HCAP coverage 3/18: Taken off BiPAP by CCM, placed on [place]  3/19: had episodes of torsades, potassium still low, Mg and Phos normal 3/19: improved work of breathing  3/20: palliative care meeting set for Monday 3/23 3/21: Atrial tachyarrythmia, overnight tachy/brady to 30s, CCM re-consulted,  3/22: scheduled metoprolol>>started amiodarone drip due to intermittent Bradycardia, dc'd thioridizine (QT prolongation) 3/23: DNR/DNI, ECHO 55-60%, mild LVH 3/24: transitioned ABX, transferred out of step down . 3/26: DC ABX, Do not re-hospitalize, comfort primary goal 3/27: Not a candidate for hospice appropriate, SNF with palliative, evaluated for level II PASRR  ABX/Culture Vanc (3/18)>> 3/24 Primaxin (3/18)>> 3/24  Azithromycin (3/24) >>3/26  Urine Cx (3/18)>>No growth  Blood cx (3/18)>>NG Resp swab (3/19)>>negative MRSA Screen Positive  Assessment and Plan: [name] [name] is a 79 y.o. male presenting with respiratory distress secondary to suspected aspiration PNA . PMH is significant for MR, COPD/Asthma, tobacco abuse.   #Severe Sepsis secondary to HCAP/Aspiration PNA w/ Acute Respiratory Distress: not appropriate for hospice. SNF with palliative may be appropriate. Evaluated for level II PASRR.  - Palliative: Appropriate for Hospice Facility, comfort is primary goal.  - Duoneb Q6 scheduled  - prednisone 10 mg (3/27)  #Volume excess, Small Right pleural effusion: Net -7.5 L since admission. 141>>138 lbs  - IV Lasix 40 mg daily>>dc today  - Torsemide 40 mg   #Tachycardia - Intermittent  Atrial flutter with RVR, likely related to respiratory distress - Amiodarone 400 mg PO during hospitalization;  - Would decrease to 200 mg daily in 2 weeks from (3/24)  - continue lasix   #Mixed Respiratory Acidosis (CO2 retention), Metabolic acidosis (elevated lactic acid), and Metabolic Alkalosis (vomiting): improved and stable.  - comfort care as above.    #Hypokalemia: Resolved - Had arrythmias possibly associated with hypoK earlier in admission  #Hx of COPD and Asthma - Most likely cause of his Chronic Respiratory Acidosis  - Atrovent TID   - pulmocort and PRN duoneb  #MR/Psychiatry Mood Disorder: may discontinue meds on discharge as he may be over medicated and contributing to his aspiration.  - lorazepam Q4PRN   #Diarrhea: resolved - Holding Laxatives   # Hx of HTN: blood pressures improved  - Holding home Metoprolol/Dyazide   FEN/GI: saline lock, dys 2  Prophylaxis: Heparin SQ  Disposition: pending improvement   Subjective: patient had no overnight events. He is stable at this point.    Objective: Temp:  [97.5 F (36.4 C)-98.8 F (37.1 C)] 97.5 F (36.4 C) (03/28 0548) Pulse Rate:  [70-98] 70 (03/28 0548) Resp:  [16-18] 18 (03/28 0548) BP: (91-113)/(62-69) 104/64 mmHg (03/28 0548) SpO2:  [88 %-97 %] 96 % (03/28 0548) Physical Exam: General: NAD, alert, HEENT: abrasion on mid forehead Cardiovascular:Regular rate and rhythm, difficult to hear over loud coarse breathing Respiratory: improved breath sounds, normal WOB   Skin: Intact, dry, warm  Neuro: No focal deficits, appears to be at baseline level of function  Laboratory:  Recent Labs Lab 04/11/13 0430 04/13/13 0350  WBC 8.9 13.8*  HGB 11.5* 12.3*  HCT 33.9* 36.4*  PLT 259 316    Recent Labs Lab  04/11/13 1640 04/12/13 0336 04/13/13 0350  NA 143 144 147  K 3.6* 4.0 4.2  CL 103 105 102  CO2 24 25 29   BUN 23 25* 28*  CREATININE 0.65 0.65 0.70  CALCIUM 8.6 8.5 8.6  GLUCOSE 175* 172* 149*     Imaging/Diagnostic Tests: CXR 04/07/13  Increased density predominantly at the left lung base is consistent with acute atelectasis or pneumonia. There are chronic fibrotic changes in both lungs. There is no evidence of pulmonary edema.  CT head 04/07/13:  IMPRESSION:  No acute intracranial pathology.  CXR 04/07/13 IMPRESSION:  Persistent asymmetric left basilar airspace disease suspicious for pneumonia or aspiration.  CXR 04/08/13 1. Similar appearance of patchy left basilar airspace disease, suspicious for pneumonia.  2. Small right pleural effusion, new relative to prior study. No pulmonary edema.  CXR 3/22 IMPRESSION:  Multifocal patchy opacities, suspicious for pneumonia, increased.  Suspected small left pleural effusion.   Myra Rude, MD 04/17/2013, 11:38 AM PGY-1,  Family Medicine FPTS Intern pager: [phone], text pages welcome

## 2013-04-17 NOTE — Progress Notes (Signed)
Spoke with on-call provider regarding patients IV access. Current site is due to be rotated by midnight. Patient is not receiving IV fluids, site has no redness, patient complains of no pain. On-call MD stated okay to leave current site in and not rotate to another site at this time. Will continue to monitor.

## 2013-04-18 NOTE — Progress Notes (Signed)
Family Medicine Teaching Service Daily Progress Note Intern Pager: [phone]  Patient name: [name] [name] Medical record number: [id number] Date of birth: [birth year] Age: 79 y.o. Gender: male  Primary Care Provider: Jacquelin Hawking, MD Consultants: CCM Code Status: Full (DSS next of contact)  Pt Overview and Major Events to Date:  3/18: admitted for respiratory distress, on Bipap, Zenaida Niece and Primaxin for HCAP coverage 3/18: Taken off BiPAP by CCM, placed on [place]  3/19: had episodes of torsades, potassium still low, Mg and Phos normal 3/19: improved work of breathing  3/20: palliative care meeting set for Monday 3/23 3/21: Atrial tachyarrythmia, overnight tachy/brady to 30s, CCM re-consulted,  3/22: scheduled metoprolol>>started amiodarone drip due to intermittent Bradycardia, dc'd thioridizine (QT prolongation) 3/23: DNR/DNI, ECHO 55-60%, mild LVH 3/24: transitioned ABX, transferred out of step down . 3/26: DC ABX, Do not re-hospitalize, comfort primary goal 3/27: Not a candidate for hospice appropriate, SNF with palliative, evaluated for level II PASRR  ABX/Culture Vanc (3/18)>> 3/24 Primaxin (3/18)>> 3/24  Azithromycin (3/24) >>3/26  Urine Cx (3/18)>>No growth  Blood cx (3/18)>>NG Resp swab (3/19)>>negative MRSA Screen Positive  Assessment and Plan: [name] [name] is a 79 y.o. male presenting with respiratory distress secondary to suspected aspiration PNA . PMH is significant for MR, COPD/Asthma, tobacco abuse.   #Severe Sepsis secondary to HCAP/Aspiration PNA w/ Acute Respiratory Distress: not appropriate for hospice. SNF with palliative may be appropriate. Evaluated for level II PASRR.  - Palliative: Appropriate for Hospice Facility, comfort is primary goal.  - Duoneb Q6 scheduled  - prednisone 10 mg (3/27)  #Volume excess, Small Right pleural effusion: Net -7.5 L since admission. 141>>138 lbs  - s/p IV Lasix 40 mg daily - Torsemide 40 mg  - UOP not measured, does have a foley  in place and subjectively has good UOP  #Tachycardia - Intermittent Atrial flutter with RVR, likely related to respiratory distress - Amiodarone 400 mg PO during hospitalization;  - Would decrease to 200 mg daily in 2 weeks from (3/24)  - Continue Torsemide  #Mixed Respiratory Acidosis (CO2 retention), Metabolic acidosis (elevated lactic acid), and Metabolic Alkalosis (vomiting): improved and stable.  - comfort care as above.    #Hypokalemia: Resolved - Had arrythmias possibly associated with hypoK earlier in admission - No longer checking labs  #Hx of COPD and Asthma - Most likely cause of his Chronic Respiratory Acidosis  - Atrovent TID   - pulmocort and PRN duoneb  #MR/Psychiatry Mood Disorder: may discontinue meds on discharge as he may be over medicated and contributing to his aspiration.  - lorazepam Q4PRN   #Diarrhea: resolved - Holding Laxatives   # Hx of HTN: blood pressures improved  - Holding home Metoprolol/Dyazide   FEN/GI: saline lock, dys 2  Prophylaxis: Heparin SQ  Disposition: pending placement  Subjective: Patient is non-verbal with me this morning.  Objective: Temp:  [98.1 F (36.7 C)-98.6 F (37 C)] 98.1 F (36.7 C) (03/29 0551) Pulse Rate:  [72-86] 72 (03/29 0551) Resp:  [16-18] 16 (03/29 0551) BP: (104-110)/(64-71) 104/64 mmHg (03/29 0551) SpO2:  [94 %-98 %] 95 % (03/29 0803) Physical Exam: General: NAD, awake HEENT: abrasion on mid forehead with surrounding echhymosis Cardiovascular: Regular rate and rhythm, difficult to hear over loud coarse breathing Respiratory: diffuse rales with cough, normal WOB   Skin: Intact, dry, warm  Neuro: No focal deficits, appears to be at baseline level of function  Laboratory:  Recent Labs Lab 04/13/13 0350  WBC 13.8*  HGB 12.3*  HCT  36.4*  PLT 316    Recent Labs Lab 04/11/13 1640 04/12/13 0336 04/13/13 0350  NA 143 144 147  K 3.6* 4.0 4.2  CL 103 105 102  CO2 24 25 29   BUN 23 25* 28*   CREATININE 0.65 0.65 0.70  CALCIUM 8.6 8.5 8.6  GLUCOSE 175* 172* 149*    Imaging/Diagnostic Tests: CXR 04/07/13  Increased density predominantly at the left lung base is consistent with acute atelectasis or pneumonia. There are chronic fibrotic changes in both lungs. There is no evidence of pulmonary edema.  CT head 04/07/13:  IMPRESSION:  No acute intracranial pathology.  CXR 04/07/13 IMPRESSION:  Persistent asymmetric left basilar airspace disease suspicious for pneumonia or aspiration.  CXR 04/08/13 1. Similar appearance of patchy left basilar airspace disease, suspicious for pneumonia.  2. Small right pleural effusion, new relative to prior study. No pulmonary edema.  CXR 3/22 IMPRESSION:  Multifocal patchy opacities, suspicious for pneumonia, increased.  Suspected small left pleural effusion.   [name] FredricksonAmber M Sheika Coutts, MD 04/18/2013, 8:35 AM PGY-3, [place] [facility] FPTS Intern pager: [phone], text pages welcome

## 2013-04-18 NOTE — Progress Notes (Signed)
Seen and examined.  Discussed with Dr. Mikel CellaHairford.  Remains medically stable.  As such, I dced his hep lock.  Also DCed torsemide - concerned about use with his uncertain PO intake.  Watch fluid status.  Residents, please check if he continues to need prednisone.  Is this a long term med for his asthma?

## 2013-04-19 DIAGNOSIS — G510 Bell's palsy: Secondary | ICD-10-CM

## 2013-04-19 MED ORDER — PREDNISONE 5 MG PO TABS
5.0000 mg | ORAL_TABLET | Freq: Every day | ORAL | Status: DC
  Filled 2013-04-19: qty 1

## 2013-04-19 MED ORDER — BUDESONIDE 0.25 MG/2ML IN SUSP: 0.2500 mg | Freq: Two times a day (BID) | RESPIRATORY_TRACT | Status: DC

## 2013-04-19 MED ORDER — BUDESONIDE 0.25 MG/2ML IN SUSP
0.2500 mg | Freq: Two times a day (BID) | RESPIRATORY_TRACT | Status: DC
  Administered 2013-04-19: 0.25 mg via RESPIRATORY_TRACT
  Filled 2013-04-19 (×3): qty 2

## 2013-04-19 MED ORDER — MORPHINE SULFATE (CONCENTRATE) 10 MG /0.5 ML PO SOLN: 5.0000 mg | ORAL | Status: DC | PRN

## 2013-04-19 MED ORDER — AMIODARONE HCL 400 MG PO TABS: 400.0000 mg | ORAL_TABLET | Freq: Every day | ORAL | Status: DC

## 2013-04-19 MED ORDER — LORAZEPAM 2 MG/ML PO CONC: 0.5000 mg | ORAL | Status: DC | PRN

## 2013-04-19 MED ORDER — PREDNISONE 5 MG PO TABS: ORAL_TABLET | ORAL | Status: DC

## 2013-04-19 MED ORDER — LORAZEPAM 2 MG/ML PO CONC: 0.6000 mg | ORAL | Status: AC | PRN

## 2013-04-19 NOTE — Discharge Instructions (Signed)
Mr. [name] [name], you were admitted for respiratory distress. We will be sending you home on a daily pulmicort. This will help you breath on a daily basis.

## 2013-04-19 NOTE — Progress Notes (Signed)
Family Medicine Teaching Service Daily Progress Note Intern Pager: [phone]  Patient name: [name] [name] Medical record number: [id number] Date of birth: 1934-12-26 Age: 79 y.o. Gender: male  Primary Care Provider: Jacquelin HawkingNettey, Ralph, MD Consultants: CCM Code Status: Full (DSS next of contact)  Pt Overview and Major Events to Date:  3/18: admitted for respiratory distress, on Bipap, Zenaida NieceVan and Primaxin for HCAP coverage 3/18: Taken off BiPAP by CCM, placed on [place]  3/19: had episodes of torsades, potassium still low, Mg and Phos normal 3/19: improved work of breathing  3/20: palliative care meeting set for Monday 3/23 3/21: Atrial tachyarrythmia, overnight tachy/brady to 30s, CCM re-consulted,  3/22: scheduled metoprolol>>started amiodarone drip due to intermittent Bradycardia, dc'd thioridizine (QT prolongation) 3/23: DNR/DNI, ECHO 55-60%, mild LVH 3/24: transitioned ABX, transferred out of step down . 3/26: DC ABX, Do not re-hospitalize, comfort primary goal 3/27: Not a candidate for hospice appropriate, SNF with palliative, evaluated for level II PASRR  ABX/Culture Vanc (3/18)>> 3/24 Primaxin (3/18)>> 3/24  Azithromycin (3/24) >>3/26  Urine Cx (3/18)>>No growth  Blood cx (3/18)>>NG Resp swab (3/19)>>negative MRSA Screen Positive  Assessment and Plan: [name] PentaDonald Quesinberry is a 79 y.o. male presenting with respiratory distress secondary to suspected aspiration PNA . PMH is significant for MR, COPD/Asthma, tobacco abuse.   #Severe Sepsis secondary to HCAP/Aspiration PNA w/ Acute Respiratory Distress: not appropriate for hospice. SNF with palliative may be appropriate. Evaluated for level II PASRR.  - Palliative: Appropriate for Hospice Facility, comfort is primary goal.  - Duoneb Q6 scheduled  - prednisone 10 mg (3/27)  #Volume excess, Small Right pleural effusion: Net -6.3L since admission. - Torsemide 40 mg>>stopped (3/29) - UOP not measured, does have a foley in place and subjectively has  good UOP  #Tachycardia - Intermittent Atrial flutter with RVR, likely related to respiratory distress - Amiodarone 400 mg PO during hospitalization;  - Would decrease to 200 mg daily in 2 weeks from (3/24)  - Continue Torsemide  #Mixed Respiratory Acidosis (CO2 retention), Metabolic acidosis (elevated lactic acid), and Metabolic Alkalosis (vomiting): improved and stable.  - comfort care as above.    #Hypokalemia: Resolved - Had arrythmias possibly associated with hypoK earlier in admission - No longer checking labs  #Hx of COPD and Asthma - Most likely cause of his Chronic Respiratory Acidosis  - Atrovent TID   - pulmocort and PRN duoneb  #MR/Psychiatry Mood Disorder: may discontinue meds on discharge as he may be over medicated and contributing to his aspiration.  - lorazepam Q4PRN   #Diarrhea: resolved - Holding Laxatives   # Hx of HTN: blood pressures improved  - Holding home Metoprolol/Dyazide   FEN/GI: saline lock, dys 2  Prophylaxis: Heparin SQ  Disposition: pending placement  Subjective: Patient was sitting up eating breakfast this AM.   Objective: Temp:  [97.8 F (36.6 C)-98.3 F (36.8 C)] 97.8 F (36.6 C) (03/30 0505) Pulse Rate:  [74-80] 78 (03/30 0505) Resp:  [16-18] 18 (03/30 0505) BP: (118-120)/(74-79) 118/74 mmHg (03/30 0505) SpO2:  [90 %-96 %] 90 % (03/30 0505) Physical Exam: General: NAD, awake HEENT: abrasion on mid forehead with surrounding echhymosis Cardiovascular: Regular rate and rhythm, difficult to hear over loud coarse breathing Respiratory: diffuse rales with cough, normal WOB   Skin: Intact, dry, warm  Neuro: No focal deficits, appears to be at baseline level of function  Laboratory:  Recent Labs Lab 04/13/13 0350  WBC 13.8*  HGB 12.3*  HCT 36.4*  PLT 316    Recent Labs  Lab 04/13/13 0350  NA 147  K 4.2  CL 102  CO2 29  BUN 28*  CREATININE 0.70  CALCIUM 8.6  GLUCOSE 149*    Imaging/Diagnostic Tests: CXR 04/07/13   Increased density predominantly at the left lung base is consistent with acute atelectasis or pneumonia. There are chronic fibrotic changes in both lungs. There is no evidence of pulmonary edema.  CT head 04/07/13:  IMPRESSION:  No acute intracranial pathology.  CXR 04/07/13 IMPRESSION:  Persistent asymmetric left basilar airspace disease suspicious for pneumonia or aspiration.  CXR 04/08/13 1. Similar appearance of patchy left basilar airspace disease, suspicious for pneumonia.  2. Small right pleural effusion, new relative to prior study. No pulmonary edema.  CXR 3/22 IMPRESSION:  Multifocal patchy opacities, suspicious for pneumonia, increased.  Suspected small left pleural effusion.   Myra Rude, MD 04/19/2013, 8:01 AM PGY-3, [facility] FPTS Intern pager: [phone], text pages welcome

## 2013-04-19 NOTE — Progress Notes (Signed)
Attending Addendum  I examined the patient and discussed the assessment and plan with Dr. Jordan LikesSchmitz. I have reviewed the note and agree.  Patient with prolonged hospitalization following HCAP due to cognitive impairment and mood disorder.   Has history of COPD/asthma on scheduled nebulizer as he is unable to use an inhaler. Started pulmicort neb today with plan to taper oral prednisone to off.   Dispo: SNF placement. Has a bed today.      Dessa PhiFUNCHES,Shaka Zech, MD FAMILY MEDICINE TEACHING SERVICE

## 2013-04-19 NOTE — Clinical Social Work Placement (Signed)
Clinical Social Work Department CLINICAL SOCIAL WORK PLACEMENT NOTE 04/19/2013  Patient:  [name],[name]  Account Number:  [id number] Admit date:  04/07/2013  Clinical Social Worker:  Lavell LusterJOSEPH BRYANT Leisel Pinette, LCSWA  Date/time:  04/19/2013 04:18 PM  Clinical Social Work is seeking post-discharge placement for this patient at the following level of care:   SKILLED NURSING   (*CSW will update this form in Epic as items are completed)   04/19/2013  Patient/family provided with [facility] of Clinical Social Work's list of facilities offering this level of care within the geographic area requested by the patient (or if unable, by the patient's family).  04/19/2013  Patient/family informed of their freedom to choose among providers that offer the needed level of care, that participate in Medicare, Medicaid or managed care program needed by the patient, have an available bed and are willing to accept the patient.  04/19/2013  Patient/family informed of [facility]' ownership interest in [facility], as well as of the fact that they are under no obligation to receive care at this facility.  PASARR submitted to EDS on 04/14/2013 PASARR number received from EDS on 04/19/2013  FL2 transmitted to all facilities in geographic area requested by pt/family on  04/19/2013 FL2 transmitted to all facilities within larger geographic area on   Patient informed that his/her managed care company has contracts with or will negotiate with  certain facilities, including the following:     Patient/family informed of bed offers received:  04/19/2013 [facility] [place] Physician recommends and patient chooses bed at    [facility] [place] on  04/19/2013 Patient to be transferred to facility by Ambulance  The following physician request were entered in Epic:   Additional Comments: Per MD patient [facility] services. RN and patient's guardian notified of DC. Guardian chose [facility]. RN given number for report. DC packet on chart. Ambulance transport requested for patient. Guardian Jill Huegel to complete paperwork with facility. CSW signing off at this time.    Roddie McBryant Boris Engelmann, [place]CSWA, [place], [phone]

## 2013-04-19 NOTE — Discharge Summary (Signed)
Attending Addendum  I examined the patient and discussed the discharge plan with Dr. Adamo. I have reviewed the note and agree.    Wright Gravely, MD FAMILY MEDICINE TEACHING SERVICE      

## 2013-04-20 ENCOUNTER — Non-Acute Institutional Stay (SKILLED_NURSING_FACILITY): Payer: Medicare Other | Admitting: Internal Medicine

## 2013-04-20 ENCOUNTER — Encounter: Payer: Self-pay | Admitting: Internal Medicine

## 2013-04-20 DIAGNOSIS — I4892 Unspecified atrial flutter: Secondary | ICD-10-CM

## 2013-04-20 DIAGNOSIS — F0391 Unspecified dementia with behavioral disturbance: Secondary | ICD-10-CM

## 2013-04-20 DIAGNOSIS — A419 Sepsis, unspecified organism: Secondary | ICD-10-CM

## 2013-04-20 DIAGNOSIS — J9620 Acute and chronic respiratory failure, unspecified whether with hypoxia or hypercapnia: Secondary | ICD-10-CM

## 2013-04-20 DIAGNOSIS — R6520 Severe sepsis without septic shock: Principal | ICD-10-CM

## 2013-04-20 DIAGNOSIS — D509 Iron deficiency anemia, unspecified: Secondary | ICD-10-CM

## 2013-04-20 DIAGNOSIS — F79 Unspecified intellectual disabilities: Secondary | ICD-10-CM

## 2013-04-20 DIAGNOSIS — F03918 Unspecified dementia, unspecified severity, with other behavioral disturbance: Secondary | ICD-10-CM

## 2013-04-20 DIAGNOSIS — J4489 Other specified chronic obstructive pulmonary disease: Secondary | ICD-10-CM

## 2013-04-20 DIAGNOSIS — J449 Chronic obstructive pulmonary disease, unspecified: Secondary | ICD-10-CM

## 2013-04-20 NOTE — Assessment & Plan Note (Signed)
Not on meds-comfort care

## 2013-04-20 NOTE — Assessment & Plan Note (Signed)
2/2 aspiration PNA; pt was treated with abx for 8 days-goal of care would be not to resume or re-initiate; do not re-hospitalize; expect decline to be rapid

## 2013-04-20 NOTE — Assessment & Plan Note (Signed)
And ward of the state; pallative care consulted

## 2013-04-20 NOTE — Progress Notes (Signed)
MRN: [id number] Name: Kemp Gomes  Sex: male Age: 79 y.o. DOB: 12/27/1934  PSC #: Ronni Rumble Facility/Room: 106 Level Of Care: SNF Provider: Merrilee Seashore D Emergency Contacts: Extended Emergency Contact Information Primary Emergency Contact: Coltrane,Jai Address: [address]          [place] [address] [place] Home Phone: [phone] Mobile Phone: [phone] Relation: Other Secondary Emergency Contact: Huegel,Jill  [place] Mobile Phone: [phone] Relation: Legal Guardian  Code Status: DNR with comfort goals only- no antibiotics, no hospitalizations, no feeding tubes, comfort feeds as tolerated with supervision; he seemed to handle regular water sips well and was comforted by this; gets hypoxic with minimal exertion  Allergies: Penicillins  Chief Complaint  Patient presents with  . nursing home admission    HPI: Patient is 79 y.o. male who has dementia and MR, was living at group home when was sent to ED with respiratory failure, PNA, sepsis. Pt is ward of state and has had pallitive care imput since in hospital. He is being admitted to SNF for comfort care/end of life care.  Past Medical History  Diagnosis Date  . COPD (chronic obstructive pulmonary disease)   . History of stomach ulcers   . Retardation   . Anemia   . Hypertension   . GERD (gastroesophageal reflux disease)   . Dementia     Past Surgical History  Procedure Laterality Date  . Gastrectomy        Medication List       This list is accurate as of: 04/20/13 10:44 PM.  Always use your most recent med list.               amiodarone 400 MG tablet  Commonly known as:  PACERONE  Take 1 tablet (400 mg total) by mouth daily.     budesonide 0.25 MG/2ML nebulizer solution  Commonly known as:  PULMICORT  Take 2 mLs (0.25 mg total) by nebulization 2 (two) times daily.     COLACE 100 MG capsule  Generic drug:  docusate sodium  Take 100 mg by mouth 2 (two)  times daily. Scheduled     ENSURE  Drink by mouth every other day     esomeprazole 40 MG capsule  Commonly known as:  NEXIUM  Take 40 mg by mouth 2 (two) times daily before a meal.     LORazepam 2 MG/ML concentrated solution  Commonly known as:  ATIVAN  Take 0.3 mLs (0.6 mg total) by mouth every 4 (four) hours as needed for anxiety, seizure, sedation or sleep.     morphine CONCENTRATE 10 mg / 0.5 ml concentrated solution  Take 0.25 mLs (5 mg total) by mouth every 2 (two) hours as needed for moderate pain, severe pain, anxiety or shortness of breath.     potassium chloride SA 20 MEQ tablet  Commonly known as:  K-DUR,KLOR-CON  Take 20 mEq by mouth every morning.     predniSONE 5 MG tablet  Commonly known as:  DELTASONE  - 5 mg daily with breakfast for 5 days  - 2 mg daily with breakfast for 5 days   - 1 mg daily with breakfast for 5 days.     PROAIR HFA 108 (90 BASE) MCG/ACT inhaler  Generic drug:  albuterol  Inhale 2 puffs into the lungs every 4 (four) hours as needed for wheezing or shortness of breath. Use with spacer     sucralfate 1 GM/10ML suspension  Commonly known as:  CARAFATE  Take 1 g by mouth 4 (four) times daily.        No orders of the defined types were placed in this encounter.    Immunization History  Administered Date(s) Administered  . Influenza Whole 12/11/2006, 11/24/2007, 02/02/2008, 12/22/2008  . Influenza,inj,Quad PF,36+ Mos 11/26/2012  . Pneumococcal Polysaccharide-23 08/22/1998, 12/11/2006  . Td 10/21/2005  . Tdap 05/08/2011, 05/08/2011    History  Substance Use Topics  . Smoking status: Former Games developermoker  . Smokeless tobacco: Not on file  . Alcohol Use: No    Family history is noncontributory    Review of Systems  DATA OBTAINED: UTO sec to pt non verbal  Filed Vitals:   04/20/13 2222  BP: 137/81  Pulse: 68  Temp: 96.6 F (35.9 C)  Resp: 20    Physical Exam  GENERAL APPEARANCE: Alert, nonconversant. Appropriately  groomed. No acute distress.  SKIN: No diaphoresis rash HEAD: Normocephalic, atraumatic  EYES: Conjunctiva/lids clear. Pupils round, reactive. EOMs intact.  EARS: External exam WNL, canals clear. Hearing grossly normal.  NOSE: No deformity or discharge.  MOUTH/THROAT: Lips w/o lesions. RESPIRATORY: Breathing is even, unlabored. Lung sounds are decreased BS on R  CARDIOVASCULAR: Heart RRR no murmurs, rubs or gallops. No peripheral edema.  GASTROINTESTINAL: Abdomen is soft, non-tender, not distended w/ normal bowel sounds GENITOURINARY: Bladder non tender, not distended  MUSCULOSKELETAL: No abnormal joints or musculature NEUROLOGIC: pt made eye contact but did not speak; pt was rolled in a loose ball PSYCHIATRIC: dementia and MR no behavioral issues  Patient Active Problem List   Diagnosis Date Noted  . Acute and chronic respiratory failure 04/20/2013  . Dementia with behavioral disturbance 04/20/2013  . Atrial flutter 04/11/2013  . Malnutrition of moderate degree 04/08/2013  . Aspiration pneumonia 04/07/2013  . Severe sepsis 04/07/2013  . Well adult exam 11/27/2012  . Fall on steps 12/12/2011  . Chest wall pain 12/12/2011  . Laceration of ear 05/08/2011  . Conjunctivitis 04/04/2011  . Allergic rhinitis 04/04/2010  . ECZEMA 12/01/2009  . TOBACCO USE, QUIT 07/07/2009  . BELLS PALSY 02/07/2009  . CHOLELITHIASIS 04/06/2008  . ESOPHAGEAL ULCER, WITH BLEEDING 08/17/2007  . BARRETTS ESOPHAGUS 08/17/2007  . GI BLEEDING 08/05/2007  . COPD 04/01/2007  . HYPERLIPIDEMIA 05/05/2006  . HYPOKALEMIA 05/05/2006  . ANEMIA, IRON DEFICIENCY NOS 05/05/2006  . PSYCHOSIS, UNSPECIFIED 03/20/2006  . MENTAL RETARDATION 03/20/2006  . HYPERTENSION, BENIGN SYSTEMIC 03/20/2006  . ASTHMA, INTERMITTENT 03/20/2006  . GASTROESOPHAGEAL REFLUX, NO ESOPHAGITIS 03/20/2006  . HERNIA, HIATAL, NONCONGENITAL 03/20/2006    CBC    Component Value Date/Time   WBC 13.8* 04/13/2013 0350   RBC 4.05* 04/13/2013 0350    HGB 12.3* 04/13/2013 0350   HCT 36.4* 04/13/2013 0350   PLT 316 04/13/2013 0350   MCV 89.9 04/13/2013 0350   LYMPHSABS 1.2 04/08/2013 0428   MONOABS 1.3* 04/08/2013 0428   EOSABS 0.0 04/08/2013 0428   BASOSABS 0.0 04/08/2013 0428    CMP     Component Value Date/Time   NA 147 04/13/2013 0350   K 4.2 04/13/2013 0350   CL 102 04/13/2013 0350   CO2 29 04/13/2013 0350   GLUCOSE 149* 04/13/2013 0350   BUN 28* 04/13/2013 0350   CREATININE 0.70 04/13/2013 0350   CREATININE 0.92 11/26/2012 1426   CALCIUM 8.6 04/13/2013 0350   PROT 6.1 04/08/2013 0428   ALBUMIN 2.5* 04/08/2013 0428   AST 57* 04/08/2013 0428   ALT 38 04/08/2013 0428   ALKPHOS 59 04/08/2013 0428  BILITOT 0.3 04/08/2013 0428   GFRNONAA 89* 04/13/2013 0350   GFRAA >90 04/13/2013 0350    Assessment and Plan  Severe sepsis 2/2 aspiration PNA; pt was treated with abx for 8 days-goal of care would be not to resume or re-initiate; do not re-hospitalize; expect decline to be rapid  MENTAL RETARDATION And ward of the state; pallative care consulted  Acute and chronic respiratory failure 2/2 aspiration PNA - pt will be comfort care only: continue nebs and if pt can't take oral prednisone that is OK; pt and resp status is ex[pected to decline; do not retreat with abx, do not send to hospital,;COMFORT CARE  COPD Continue nebs and MDI as long as pt tolerates  Atrial flutter Pt was in and out of a flutter, treated with Mg and Phos and arrythmia stopped ;pt is now on pacerone  ANEMIA, IRON DEFICIENCY NOS Not on meds-comfort care  Dementia with behavioral disturbance Pt no longer on namenda-comfort measures only    Margit Hanks, MD

## 2013-04-20 NOTE — Assessment & Plan Note (Signed)
Pt was in and out of a flutter, treated with Mg and Phos and arrythmia stopped ;pt is now on pacerone

## 2013-04-20 NOTE — Assessment & Plan Note (Signed)
Continue nebs and MDI as long as pt tolerates

## 2013-04-20 NOTE — Assessment & Plan Note (Signed)
2/2 aspiration PNA - pt will be comfort care only: continue nebs and if pt can't take oral prednisone that is OK; pt and resp status is ex[pected to decline; do not retreat with abx, do not send to hospital,;COMFORT CARE

## 2013-04-20 NOTE — Assessment & Plan Note (Signed)
Pt no longer on namenda-comfort measures only

## 2013-05-18 ENCOUNTER — Non-Acute Institutional Stay (SKILLED_NURSING_FACILITY): Payer: Medicare Other | Admitting: Internal Medicine

## 2013-05-18 ENCOUNTER — Encounter: Payer: Self-pay | Admitting: Internal Medicine

## 2013-05-18 DIAGNOSIS — D509 Iron deficiency anemia, unspecified: Secondary | ICD-10-CM

## 2013-05-18 DIAGNOSIS — J449 Chronic obstructive pulmonary disease, unspecified: Secondary | ICD-10-CM

## 2013-05-18 DIAGNOSIS — J9620 Acute and chronic respiratory failure, unspecified whether with hypoxia or hypercapnia: Secondary | ICD-10-CM

## 2013-05-18 DIAGNOSIS — I10 Essential (primary) hypertension: Secondary | ICD-10-CM

## 2013-05-18 DIAGNOSIS — I4892 Unspecified atrial flutter: Secondary | ICD-10-CM

## 2013-05-18 DIAGNOSIS — F03918 Unspecified dementia, unspecified severity, with other behavioral disturbance: Secondary | ICD-10-CM

## 2013-05-18 DIAGNOSIS — E440 Moderate protein-calorie malnutrition: Secondary | ICD-10-CM

## 2013-05-18 DIAGNOSIS — F0391 Unspecified dementia with behavioral disturbance: Secondary | ICD-10-CM

## 2013-05-18 DIAGNOSIS — J690 Pneumonitis due to inhalation of food and vomit: Secondary | ICD-10-CM

## 2013-05-18 DIAGNOSIS — F79 Unspecified intellectual disabilities: Secondary | ICD-10-CM

## 2013-05-18 DIAGNOSIS — K219 Gastro-esophageal reflux disease without esophagitis: Secondary | ICD-10-CM

## 2013-05-18 NOTE — Progress Notes (Signed)
MRN: [id number] Name: [name] [name]  Sex: male Age: 79 y.o. DOB: 10/26/1934  [facility] Facility/Room: 102A Level Of Care: SNF Provider: Margit HanksAnne D Rhiann Boucher Emergency Contacts: Extended Emergency Contact Information Primary Emergency Contact: Coltrane,Jai Address: [address]          [place] [id number] [place] Home Phone: [phone] Mobile Phone: [phone] Relation: Other Secondary Emergency Contact: Pollyann SamplesHuegel,Jill  [place] Mobile Phone: [phone] Relation: Legal Guardian  Code Status: DNR  Allergies: Penicillins  Chief Complaint  Patient presents with  . Discharge Note    HPI: Patient is 79 y.o. male who was admitted after PNA and sepsis who is now ready to be discharged back to his group home.  Past Medical History  Diagnosis Date  . COPD (chronic obstructive pulmonary disease)   . History of stomach ulcers   . Retardation   . Anemia   . Hypertension   . GERD (gastroesophageal reflux disease)   . Dementia     Past Surgical History  Procedure Laterality Date  . Gastrectomy        Medication List       This list is accurate as of: 05/18/13  3:15 PM.  Always use your most recent med list.               amiodarone 400 MG tablet  Commonly known as:  PACERONE  Take 1 tablet (400 mg total) by mouth daily.     clotrimazole 1 % cream  Commonly known as:  LOTRIMIN  Apply 1 application topically daily. Apply to buttocks for yeast     COLACE 100 MG capsule  Generic drug:  docusate sodium  Take 100 mg by mouth 2 (two) times daily. Scheduled     LORazepam 2 MG/ML concentrated solution  Commonly known as:  ATIVAN  Take 0.3 mLs (0.6 mg total) by mouth every 4 (four) hours as needed for anxiety, seizure, sedation or sleep.     multivitamin with minerals tablet  Take 1 tablet by mouth daily.     omeprazole 20 MG capsule  Commonly known as:  PRILOSEC  Take 20 mg by mouth daily.     potassium chloride SA 20 MEQ  tablet  Commonly known as:  K-DUR,KLOR-CON  Take 20 mEq by mouth every morning.     PROAIR HFA 108 (90 BASE) MCG/ACT inhaler  Generic drug:  albuterol  Inhale 2 puffs into the lungs every 4 (four) hours as needed for wheezing or shortness of breath. Use with spacer     Sodium Chloride (Inhalant) 7 % Nebu  Inhale 0.25 mg into the lungs 2 (two) times daily.     sucralfate 1 GM/10ML suspension  Commonly known as:  CARAFATE  Take 1 g by mouth 4 (four) times daily.        Meds ordered this encounter  Medications  . omeprazole (PRILOSEC) 20 MG capsule    Sig: Take 20 mg by mouth daily.  . Sodium Chloride, Inhalant, 7 % NEBU    Sig: Inhale 0.25 mg into the lungs 2 (two) times daily.  . Multiple Vitamins-Minerals (MULTIVITAMIN WITH MINERALS) tablet    Sig: Take 1 tablet by mouth daily.  . clotrimazole (LOTRIMIN) 1 % cream    Sig: Apply 1 application topically daily. Apply to buttocks for yeast    Immunization History  Administered Date(s) Administered  . Influenza Whole 12/11/2006, 11/24/2007, 02/02/2008, 12/22/2008  . Influenza,inj,Quad PF,36+ Mos 11/26/2012  . Pneumococcal Polysaccharide-23 08/22/1998, 12/11/2006  .  Td 10/21/2005  . Tdap 05/08/2011, 05/08/2011    History  Substance Use Topics  . Smoking status: Former Games developermoker  . Smokeless tobacco: Not on file  . Alcohol Use: No    Filed Vitals:   05/18/13 1502  BP: 135/73  Pulse: 89  Temp: 98.7 F (37.1 C)  Resp: 18    Physical Exam  GENERAL APPEARANCE: Alert, mod conversant. Appropriately groomed. No acute distress.  HEENT: Unremarkable. RESPIRATORY: Breathing is even, unlabored. Lung sounds are clear   CARDIOVASCULAR: Heart RRR no murmurs, rubs or gallops. No peripheral edema.  GASTROINTESTINAL: Abdomen is soft, non-tender, not distended w/ normal bowel sounds.  NEUROLOGIC: Cranial nerves 2-12 grossly intact. Moves all extremities RUE tremor.  Patient Active Problem List   Diagnosis Date Noted  . Acute and  chronic respiratory failure 04/20/2013  . Dementia with behavioral disturbance 04/20/2013  . Atrial flutter 04/11/2013  . Malnutrition of moderate degree 04/08/2013  . Aspiration pneumonia 04/07/2013  . Severe sepsis 04/07/2013  . Well adult exam 11/27/2012  . Fall on steps 12/12/2011  . Chest wall pain 12/12/2011  . Laceration of ear 05/08/2011  . Conjunctivitis 04/04/2011  . Allergic rhinitis 04/04/2010  . ECZEMA 12/01/2009  . TOBACCO USE, QUIT 07/07/2009  . BELLS PALSY 02/07/2009  . CHOLELITHIASIS 04/06/2008  . ESOPHAGEAL ULCER, WITH BLEEDING 08/17/2007  . BARRETTS ESOPHAGUS 08/17/2007  . GI BLEEDING 08/05/2007  . COPD 04/01/2007  . HYPERLIPIDEMIA 05/05/2006  . HYPOKALEMIA 05/05/2006  . ANEMIA, IRON DEFICIENCY NOS 05/05/2006  . PSYCHOSIS, UNSPECIFIED 03/20/2006  . MENTAL RETARDATION 03/20/2006  . HYPERTENSION, BENIGN SYSTEMIC 03/20/2006  . ASTHMA, INTERMITTENT 03/20/2006  . GASTROESOPHAGEAL REFLUX, NO ESOPHAGITIS 03/20/2006  . HERNIA, HIATAL, NONCONGENITAL 03/20/2006    CBC    Component Value Date/Time   WBC 13.8* 04/13/2013 0350   RBC 4.05* 04/13/2013 0350   HGB 12.3* 04/13/2013 0350   HCT 36.4* 04/13/2013 0350   PLT 316 04/13/2013 0350   MCV 89.9 04/13/2013 0350   LYMPHSABS 1.2 04/08/2013 0428   MONOABS 1.3* 04/08/2013 0428   EOSABS 0.0 04/08/2013 0428   BASOSABS 0.0 04/08/2013 0428    CMP     Component Value Date/Time   NA 147 04/13/2013 0350   K 4.2 04/13/2013 0350   CL 102 04/13/2013 0350   CO2 29 04/13/2013 0350   GLUCOSE 149* 04/13/2013 0350   BUN 28* 04/13/2013 0350   CREATININE 0.70 04/13/2013 0350   CREATININE 0.92 11/26/2012 1426   CALCIUM 8.6 04/13/2013 0350   PROT 6.1 04/08/2013 0428   ALBUMIN 2.5* 04/08/2013 0428   AST 57* 04/08/2013 0428   ALT 38 04/08/2013 0428   ALKPHOS 59 04/08/2013 0428   BILITOT 0.3 04/08/2013 0428   GFRNONAA 89* 04/13/2013 0350   GFRAA >90 04/13/2013 0350    Assessment and Plan  Pt is stable for discharge back to his group home O2 3L  [place] for chronic respiratory failure and COPD.  Margit HanksAnne D Daionna Crossland, MD

## 2013-05-20 ENCOUNTER — Ambulatory Visit (INDEPENDENT_AMBULATORY_CARE_PROVIDER_SITE_OTHER): Payer: Medicare Other | Admitting: Family Medicine

## 2013-05-20 VITALS — BP 108/66 | HR 70 | Temp 98.2°F

## 2013-05-20 DIAGNOSIS — F03918 Unspecified dementia, unspecified severity, with other behavioral disturbance: Secondary | ICD-10-CM

## 2013-05-20 DIAGNOSIS — J449 Chronic obstructive pulmonary disease, unspecified: Secondary | ICD-10-CM

## 2013-05-20 DIAGNOSIS — F0391 Unspecified dementia with behavioral disturbance: Secondary | ICD-10-CM

## 2013-05-20 MED ORDER — CARRINGTON MOISTURE BARRIER EX CREA: TOPICAL_CREAM | Freq: Every day | CUTANEOUS | Status: DC

## 2013-05-20 MED ORDER — LURASIDONE HCL 40 MG PO TABS: 40.0000 mg | ORAL_TABLET | Freq: Every day | ORAL | Status: DC

## 2013-05-20 MED ORDER — TRIAMCINOLONE ACETONIDE 0.1 % EX OINT: 1.0000 "application " | TOPICAL_OINTMENT | Freq: Two times a day (BID) | CUTANEOUS | Status: DC | PRN

## 2013-05-20 MED ORDER — FLUTICASONE PROPIONATE HFA 220 MCG/ACT IN AERO: 1.0000 | INHALATION_SPRAY | Freq: Two times a day (BID) | RESPIRATORY_TRACT | Status: DC

## 2013-05-20 NOTE — Assessment & Plan Note (Signed)
Restarting Flovent. No need for nebulizer treatments as patient can proceed with PRN Albuterol.  Additionally, patient with normal resting pulse ox and pulse ox > 90 with ambulation.  No indication for home O2 at this time.

## 2013-05-20 NOTE — Assessment & Plan Note (Signed)
Restarting Latuda today. Patient needs to follow up with Psychiatry.  Will not restart prior Thioridazine or Namenda at this time.

## 2013-05-20 NOTE — Progress Notes (Signed)
   Subjective:    Patient ID: [name] [name], male    DOB: 01-30-1934, 79 y.o.   MRN: [id number]  HPI 79 year old male with MR, Dementia, GERD, HTN, HLD, and hx of behavioral disturbance/psychosis presents for follow up.    Patient was recently admitted to [facility] from 3/18 - 3/30 for respiratory distress and aspiration PNA.  He was discharge to a SNF and has now been discharge back to his prior group home.   He is accompanied by a nurse and 2 gentleman from his group home.  He presents today for medication reconciliation and physical exam.  1) Med Reconciliation - Group home is concerned as his prior psychiatric medications were discontinued - Additionally, he was placed on home oxygen and albuterol nebs on discharge from the hospital as well.  They are concerned as they are not accustomed to administering O2 and nebulizer treatments.  Review of Systems Unable to obtain secondary to patient's MR/Dementia.    Objective:   Physical Exam Filed Vitals:   05/20/13 1038  BP: 108/66  Pulse: 70  Temp: 98.2 F (36.8 C)  Exam: General: elderly gentleman in NAD.  Mouth: poor dentition noted.  Cardiovascular: RRR. No murmurs, rubs, or gallops. Respiratory: CTAB. No rales, rhonchi, or wheeze. Abdomen: soft, nontender, nondistended. Extremities: No LE edema.    Assessment & Plan:  See Problem List

## 2013-05-20 NOTE — Patient Instructions (Signed)
It was nice to meet you all today.  His medications are up to date.  He needs to follow up closely with his PCP and Psychiatry.  Please follow up with his PCP in ~ 6 months.

## 2013-05-26 ENCOUNTER — Telehealth: Payer: Self-pay | Admitting: Family Medicine

## 2013-05-26 NOTE — Telephone Encounter (Signed)
[name] RamsJai Coltrane, group home leader calls about issues patient is having with Latuda. Patient has dysphagia and is on a diet for this but since beginning to take Latuda, patient's dysphagia has worsen and now is drooling uncontrollably. His shirt is having to be changed up to 6 x daily. Would like to switch this meds to something else that will not worsen dysphagia. Please advise. Patient has appt with Dr. Caleb PoppNettey on 5/8 at 1:30

## 2013-05-26 NOTE — Telephone Encounter (Signed)
Spoke with Marcie BalJerilyn and gave her verbal orders that she needed for 8 weeks instead of 4 weeks

## 2013-05-26 NOTE — Telephone Encounter (Signed)
LVM for [name] [name] to call back to give the ok on verbal orders requested below

## 2013-05-26 NOTE — Telephone Encounter (Signed)
Marcie BalJerilyn, speech therapist calls requesting verbal orders for Speech therapy for patient 2 times weekly for 4 weeks for dysphagia. Please call [phone] to give orders.

## 2013-05-28 ENCOUNTER — Ambulatory Visit (INDEPENDENT_AMBULATORY_CARE_PROVIDER_SITE_OTHER): Payer: Medicare Other | Admitting: Family Medicine

## 2013-05-28 ENCOUNTER — Encounter: Payer: Self-pay | Admitting: Family Medicine

## 2013-05-28 VITALS — BP 130/76 | HR 83 | Ht 68.0 in | Wt 130.0 lb

## 2013-05-28 DIAGNOSIS — J4489 Other specified chronic obstructive pulmonary disease: Secondary | ICD-10-CM

## 2013-05-28 DIAGNOSIS — F29 Unspecified psychosis not due to a substance or known physiological condition: Secondary | ICD-10-CM

## 2013-05-28 DIAGNOSIS — J449 Chronic obstructive pulmonary disease, unspecified: Secondary | ICD-10-CM

## 2013-05-28 DIAGNOSIS — Z79899 Other long term (current) drug therapy: Secondary | ICD-10-CM

## 2013-05-28 MED ORDER — LURASIDONE HCL 40 MG PO TABS: 20.0000 mg | ORAL_TABLET | Freq: Every day | ORAL | Status: AC

## 2013-05-28 NOTE — Progress Notes (Signed)
   Subjective:    Patient ID: [name] [name], male    DOB: 1934-11-06, 79 y.o.   MRN: [id number]  HPI Patient presents for follow-up of COPD and medication management. Patient's COPD is currently controlled on flovent and albuterol. No increased production of sputum, although, patient swallows sputum so cannot assess color. Patient is not requiring oxygen anymore. Patient also having increased drooling since starting back on Latuda. Medication was restarted at last visit per request from group home (per note). Since then, patient has been drooling more and has affected his safety during eating. Patient is on medication for psychotic disorder. He has a psychiatrist and has an appointment later this month.  Past Medical History  Diagnosis Date  . COPD (chronic obstructive pulmonary disease)   . History of stomach ulcers   . Retardation   . Anemia   . Hypertension   . GERD (gastroesophageal reflux disease)   . Dementia    History   Social History  . Marital Status: Single    Spouse Name: N/A    Number of Children: N/A  . Years of Education: N/A   Occupational History  . Not on file.   Social History Main Topics  . Smoking status: Former Games developermoker  . Smokeless tobacco: Not on file  . Alcohol Use: No  . Drug Use: No  . Sexual Activity: Not on file   Other Topics Concern  . Not on file   Social History Narrative  . No narrative on file   Review of Systems  Constitutional: Negative for fever.  Respiratory: Positive for cough. Negative for wheezing.   Cardiovascular: Negative for chest pain.  All other systems reviewed and are negative.      Objective:   Physical Exam  Constitutional: He appears well-developed.  Eyes: Conjunctivae and EOM are normal.  Cardiovascular: Normal rate, regular rhythm and normal heart sounds.   Pulmonary/Chest: Effort normal. He has no decreased breath sounds. He has no wheezes. He has no rhonchi. He has rales (mild on right).  Neurological: He is  alert.  Skin: Skin is warm and dry.          Assessment & Plan:

## 2013-05-28 NOTE — Patient Instructions (Signed)
[name] [name], it was a pleasure seeing you today. Today we talked about your COPD and medications. I am changing your Latuda. Please start taking 1/2 of your regular dose daily until you see your psychiatrist. I am also stopping your Carafate as that is not a long term medication.  Please make an appointment to see me around November for your annual visit, or earlier if needed.  If you have any questions or concerns, please do not hesitate to call the office at [phone].  Sincerely,  Jacquelin Hawkingalph Nettey, MD

## 2013-05-30 NOTE — Assessment & Plan Note (Signed)
Currently on Latuda. Will decrease to 20mg  daily until patient has visit with psychiatrist later this month. Psychiatrist should resume management of psych medications at that time.

## 2013-05-30 NOTE — Telephone Encounter (Signed)
Addressed during office visit.

## 2013-05-30 NOTE — Assessment & Plan Note (Signed)
Patient currently stable. Continue current regimen.

## 2013-06-09 ENCOUNTER — Telehealth: Payer: Self-pay | Admitting: Family Medicine

## 2013-06-09 DIAGNOSIS — F0391 Unspecified dementia with behavioral disturbance: Secondary | ICD-10-CM

## 2013-06-09 DIAGNOSIS — R1319 Other dysphagia: Secondary | ICD-10-CM

## 2013-06-09 DIAGNOSIS — F03918 Unspecified dementia, unspecified severity, with other behavioral disturbance: Secondary | ICD-10-CM

## 2013-06-09 NOTE — Telephone Encounter (Signed)
Please advise.thank you.[name] [name] [name] [name]  

## 2013-06-09 NOTE — Telephone Encounter (Signed)
Would like to have a swallow study scheduled.  Thinks he needs to be upgraded to thin liquids Please call Marcie BalJerilyn

## 2013-06-11 NOTE — Telephone Encounter (Signed)
Marcie Bal returns Dr. Dennison Nancy call. Will call again on Tuesday morning to speak with Dr. Jarvis Newcomer since he is covering Dr. Caleb Popp while he is away.

## 2013-06-18 DIAGNOSIS — R1319 Other dysphagia: Secondary | ICD-10-CM | POA: Insufficient documentation

## 2013-06-18 NOTE — Telephone Encounter (Signed)
I spoke with [name] [name]. I have ordered the modified barium swallow study at [facility]. The caregiver for [name] at the group home needs to call to schedule this.

## 2013-06-18 NOTE — Telephone Encounter (Signed)
spoke with Marcie Bal and gave number to radiology for her to call

## 2013-06-25 ENCOUNTER — Other Ambulatory Visit (HOSPITAL_COMMUNITY): Payer: Self-pay | Admitting: Family Medicine

## 2013-06-25 DIAGNOSIS — R1310 Dysphagia, unspecified: Secondary | ICD-10-CM

## 2013-07-02 ENCOUNTER — Ambulatory Visit (HOSPITAL_COMMUNITY)
Admission: RE | Admit: 2013-07-02 | Discharge: 2013-07-02 | Disposition: A | Discharge status: Home / Self Care | Payer: Medicare Other | Source: Ambulatory Visit | Attending: Family Medicine | Admitting: Family Medicine

## 2013-07-02 DIAGNOSIS — R1310 Dysphagia, unspecified: Secondary | ICD-10-CM | POA: Insufficient documentation

## 2013-07-02 DIAGNOSIS — R1313 Dysphagia, pharyngeal phase: Secondary | ICD-10-CM | POA: Insufficient documentation

## 2013-07-02 DIAGNOSIS — F03918 Unspecified dementia, unspecified severity, with other behavioral disturbance: Secondary | ICD-10-CM

## 2013-07-02 DIAGNOSIS — F0391 Unspecified dementia with behavioral disturbance: Secondary | ICD-10-CM

## 2013-07-02 DIAGNOSIS — R1319 Other dysphagia: Secondary | ICD-10-CM

## 2013-07-02 NOTE — Procedures (Signed)
Objective Swallowing Evaluation: Modified Barium Swallowing Study  Patient Details  Name: [name] [name] MRN: [id number] Date of Birth: [birth year]  Today's Date: 07/02/2013 Time: [id number] SLP Time Calculation (min): 23 min  Past Medical History:  Past Medical History  Diagnosis Date  . COPD (chronic obstructive pulmonary disease)   . History of stomach ulcers   . Retardation   . Anemia   . Hypertension   . GERD (gastroesophageal reflux disease)   . Dementia    Past Surgical History:  Past Surgical History  Procedure Laterality Date  . Gastrectomy     HPI:  Pt. seen for outpatient MBS accompanied by caregiver from group home.  PMH: intellectual disabiilty, GERD, pneumonia, dysphagia (Dys 2, nectar liquids).  MBS recommended for possible upgrade from nectar thick liquids per caregiver.     Assessment / Plan / Recommendation Clinical Impression  Dysphagia Diagnosis: Moderate pharyngeal phase dysphagia Clinical impression: Pt. continues to exhibit moderate sensorimotor pharyngeal dysphagia marked by delayed swallow initiation, reduced tongue base retraction and decreased laryngeal closure resulting in laryngeal penetration and aspiration with thin (both silent and audible) despite SLP and caregiver ensuring small sips via cup and straw.  Min-mild vallecular and pyriform sinus residue observed.  Pt. masticated solid texture with adequate prep and transit.  Recommend pt. continue nectar thick liquids, upgrade to regular texture and full supervision to assist in rate control and small bites/sips, swallow 2 times after every other bite/sip, intermittent throat clears/coughs during meals/snacks.       Treatment Recommendation  Defer treatment plan to SLP at (Comment)    Diet Recommendation Regular;Nectar-thick liquid   Liquid Administration via: Cup;Straw Medication Administration: Whole meds with puree Supervision: Patient able to self feed;Full supervision/cueing for compensatory  strategies Compensations: Slow rate;Small sips/bites;Multiple dry swallows after each bite/sip;Clear throat intermittently (throat clear/cough during meals) Postural Changes and/or Swallow Maneuvers: Seated upright 90 degrees    Other  Recommendations Oral Care Recommendations: Oral care BID   Follow Up Recommendations  Home health SLP    Frequency and Duration        Pertinent Vitals/Pain WDL         Reason for Referral Objectively evaluate swallowing function   Oral Phase Oral Preparation/Oral Phase Oral Phase: WFL   Pharyngeal Phase Pharyngeal Phase Pharyngeal Phase: Impaired Pharyngeal - Nectar Pharyngeal - Nectar Teaspoon: Delayed swallow initiation;Premature spillage to pyriform sinuses;Pharyngeal residue - valleculae;Pharyngeal residue - pyriform sinuses;Reduced tongue base retraction;Reduced laryngeal elevation Pharyngeal - Nectar Cup: Delayed swallow initiation;Premature spillage to pyriform sinuses;Pharyngeal residue - valleculae;Pharyngeal residue - pyriform sinuses;Reduced tongue base retraction;Reduced laryngeal elevation Pharyngeal - Thin Pharyngeal - Thin Teaspoon: Pharyngeal residue - valleculae;Reduced tongue base retraction Pharyngeal - Thin Cup: Penetration/Aspiration during swallow;Reduced laryngeal elevation;Reduced airway/laryngeal closure;Premature spillage to valleculae;Premature spillage to pyriform sinuses;Pharyngeal residue - valleculae;Reduced tongue base retraction Penetration/Aspiration details (thin cup): Material enters airway, passes BELOW cords and not ejected out despite cough attempt by patient;Material enters airway, passes BELOW cords without attempt by patient to eject out (silent aspiration) (both silent and audible after aspiration ) Pharyngeal - Thin Straw: Penetration/Aspiration during swallow;Reduced laryngeal elevation;Reduced airway/laryngeal closure;Premature spillage to valleculae;Premature spillage to pyriform sinuses;Pharyngeal residue -  valleculae;Reduced tongue base retraction Penetration/Aspiration details (thin straw): Material enters airway, passes BELOW cords without attempt by patient to eject out (silent aspiration) Pharyngeal - Solids Pharyngeal - Regular: Pharyngeal residue - valleculae;Reduced tongue base retraction  Cervical Esophageal Phase    GO    Cervical Esophageal Phase Cervical Esophageal Phase: [facility]    Functional  Assessment Tool Used: clinical judgement Functional Limitations: Swallowing Swallow Current Status ([id number]): At least 40 percent but less than 60 percent impaired, limited or restricted Swallow Goal Status [phone]([id number]): At least 40 percent but less than 60 percent impaired, limited or restricted Swallow Discharge Status [phone](G8998): At least 40 percent but less than 60 percent impaired, limited or restricted    Royce MacadamiaLisa Willis Zondra Lawlor M.Ed [employer] Pager [phone]  07/02/2013

## 2013-08-12 ENCOUNTER — Encounter: Payer: Self-pay | Admitting: Family Medicine

## 2013-08-12 ENCOUNTER — Ambulatory Visit (INDEPENDENT_AMBULATORY_CARE_PROVIDER_SITE_OTHER): Payer: Medicare Other | Admitting: Family Medicine

## 2013-08-12 VITALS — BP 128/78 | HR 88 | Temp 97.5°F | Ht 68.0 in | Wt 132.0 lb

## 2013-08-12 DIAGNOSIS — R112 Nausea with vomiting, unspecified: Secondary | ICD-10-CM

## 2013-08-12 NOTE — Patient Instructions (Signed)
Aspiration Precautions Aspiration is the inhaling of a liquid or object into the lungs. Things that can be inhaled into the lungs include:  Food.  Any type of liquid, such as drinks or saliva.  Stomach contents, such as vomit or stomach acid. When these things go into the lungs, damage can occur. Serious complications can then result, such as:  A lung infection (pneumonia).  A collection of pus in the lungs (lung abscess). CAUSES  A decreased level of awareness (consciousness) due to:  Traumatic brain injury or head injury.  Stroke.  Neurological disease.  Seizures.  Decreased or absent gag reflex (inability to cough).  Medical conditions that affect swallowing.  Conditions that affect the food pipe (esophagus) such as a narrowing of the esophagus (esophageal stricture).  Gastroesophageal reflux (GERD). This is also known as acid reflux.  Any type of surgery where you are put under general anesthesia or have sedation.  Drinking large amounts of alcohol.  Taking medication that causes drowsiness, confusion, or weakness.  Aging.  Dental problems.  Having a feeding tube. SYMPTOMS When aspiration occurs, different signs and symptoms can occur, such as:  Coughing (if a person has a cough or gag reflex) after swallowing food or liquids.  Difficulty breathing. This can include things like:  Breathing rapidly.  Breathing very slowly.  Loud breathing.  Hearing "gurgling" lung sounds when a person breaths.  Coughing up phlegm (sputum) that is:  Yellow, tan, or green in color.  Has pieces of food in it.  Bad smelling.  A change in voice (hoarseness) or a "gurgly" sound to the voice.  A change in skin color. The skin may turn red, or a "bluish" type color because of a lack of oxygen (cyanosis).  Fever.  Eyes watering.  Pain in the chest or back.  Facial grimacing .  A feeling of fullness in the throat or that something is stuck in the  throat. DIAGNOSIS  A chest X-ray may be performed. This takes a picture of your lungs. It can show changes in the lungs if aspiration has occurred.  A bronchoscopy may be performed. This is a surgical procedure in which a thin, flexible tube with a camera at the end is inserted into the nose or mouth. The tube is advanced to the lungs so your health care provider can view the lungs and obtain a culture, tissue sample, or remove an aspirated object.  A swallowing evaluation study may be performed to evaluate:  A person's risk of aspiration.  How difficult it is for a person to swallow.  What types of foods are safe for a person to eat. PREVENTION If you are a caregiver to someone who may aspirate, follow the directions below. If you are caring for someone who can eat and drink through their mouth:  Have them sit in an upright position when eating food or drinking fluids, such as:  Sitting up in a chair.  If sitting in a chair is not possible, position the person in bed so they are upright.  Remind the person to eat slowly and chew well.  Do not distract the person. This is especially important for people with thinking or memory (cognitive) problems.  Check the person's mouth for leftover food after eating.  Keep the person sitting upright for 30 to 45 minutes after eating.  Do not serve food or drink for at least 2 hours before bedtime. If you are caring for someone with a feeding tube and he or she   cannot eat or drink through their mouth:  Keep the person in an upright position as much as possible.  Do not  lay the person flat if they are getting continuous feedings. Turn the feeding pump off if you need to lay the person flat for any reason.  Check feeding tube residuals as directed by your health care provider. If a large amount of tube feedings are pulled back (aspirated) from the feeding tube, call your health care provider right away. General guidelines to prevent  aspiration include:  Feed small amounts of food. Do not force feed.  Use as little water as possible when brushing the person's teeth or cleaning his or her mouth.  Provide oral care before and after meals.  Never put food or fluids in the mouth of a person who is not fully alert.  Crush pills and put them in soft food such as pudding or ice cream. Some pills should not be crushed. Check with your health care provider before crushing any medication. SEEK IMMEDIATE MEDICAL CARE IF:   The person has trouble breathing or starts to breathe rapidly.  The person is breathing very slowly or stops breathing.  The person coughs a lot after eating or drinking.  The person has a chronic cough.  The person coughs up thick, yellow, or tan sputum.  The person has a fever or persistent symptoms for more than 72 hours.  The person has a fever and their symptoms suddenly get worse. Document Released: 02/09/2010 Document Revised: 01/12/2013 Document Reviewed: 04/14/2013 [facility] Patient Information 2015 [employer]. This information is not intended to replace advice given to you by your health care provider. Make sure you discuss any questions you have with your health care provider.  Please make appointment with PCP if choking/vomit with meals continues-- or if fever develops.

## 2013-08-15 DIAGNOSIS — R112 Nausea with vomiting, unspecified: Secondary | ICD-10-CM | POA: Insufficient documentation

## 2013-08-15 NOTE — Assessment & Plan Note (Signed)
Lung exam normal today. Afebrile pt with stable vitals.  Provided script for thickening agent although not certain if insurance will indeed cover.  Red flags discussed with caretaker, reassurance given, and form completed for return to [facility].  F/U 1-3 weeks if vomit continues. May need speech to adjust diet if continues.

## 2013-08-15 NOTE — Progress Notes (Signed)
   Subjective:    Patient ID: [name] PentaDonald Melecio, male    DOB: [birth year], 79 y.o.   MRN: [id number]  HPI [name] [name] is a 79 y.o. male presented to [facility], with caretaker, for vomiting  Vomiting: Patient's caretaker states [name] [name] has a history of aspiration. They have noticed he "vomited" twice on Monday (3 days ago) and concerns for aspiration. He has vomited with his meals only. He has not been febrile, chills or experienced bowel changes. He is a resident at [facility] in [place] and he is assisted with meals and follows aspiration precautions. He has had a recent Barium swallow and has been followed by outpatient Speech. He diet currently consist of regular and nectar thick per his evaluation. The care takers have noticed he is eating slower than he has prior, but still has maintained his appetite. The home has been purchasing the "thick now" and is desiring a prescription in hopes that his insurance will cover the cost.    Review of Systems Per hpi    Objective:   Physical Exam BP 128/78  Pulse 88  Temp(Src) 97.5 F (36.4 C) (Oral)  Ht 5\' 8"  (1.727 m)  Wt 132 lb (59.875 kg)  BMI 20.08 kg/m2 Gen: Mental retardation. Cooperative with exam. NAD. Non-toxic in appearance.  HEENT: AT. [place]. Bilateral TM visualized and normal in appearance. Bilateral eyes without injections or icterus. MMM. Throat without erythema or exudates.  CV: RRR  Chest: CTAB, no wheeze or crackles Abd: Soft.  NTND. BS Present. No Masses palpated.     Assessment & Plan:

## 2013-09-14 ENCOUNTER — Emergency Department (HOSPITAL_COMMUNITY): Payer: Medicare Other

## 2013-09-14 ENCOUNTER — Inpatient Hospital Stay (HOSPITAL_COMMUNITY)
Admission: EM | Admit: 2013-09-14 | Discharge: 2013-09-21 | DRG: 871 | Disposition: E | Payer: Medicare Other | Attending: Family Medicine | Admitting: Family Medicine

## 2013-09-14 ENCOUNTER — Inpatient Hospital Stay: Admission: AD | Admit: 2013-09-14 | Payer: Self-pay | Source: Ambulatory Visit | Admitting: Family Medicine

## 2013-09-14 ENCOUNTER — Encounter (HOSPITAL_COMMUNITY): Payer: Self-pay | Admitting: Emergency Medicine

## 2013-09-14 ENCOUNTER — Ambulatory Visit (INDEPENDENT_AMBULATORY_CARE_PROVIDER_SITE_OTHER): Payer: Medicare Other | Admitting: Family Medicine

## 2013-09-14 ENCOUNTER — Encounter: Payer: Self-pay | Admitting: Family Medicine

## 2013-09-14 VITALS — BP 120/67 | HR 77 | Temp 98.1°F | Ht 68.0 in | Wt 127.0 lb

## 2013-09-14 DIAGNOSIS — R269 Unspecified abnormalities of gait and mobility: Secondary | ICD-10-CM | POA: Diagnosis present

## 2013-09-14 DIAGNOSIS — E8809 Other disorders of plasma-protein metabolism, not elsewhere classified: Secondary | ICD-10-CM | POA: Diagnosis present

## 2013-09-14 DIAGNOSIS — J690 Pneumonitis due to inhalation of food and vomit: Secondary | ICD-10-CM | POA: Diagnosis present

## 2013-09-14 DIAGNOSIS — F72 Severe intellectual disabilities: Secondary | ICD-10-CM | POA: Diagnosis present

## 2013-09-14 DIAGNOSIS — Z880 Allergy status to penicillin: Secondary | ICD-10-CM

## 2013-09-14 DIAGNOSIS — J9620 Acute and chronic respiratory failure, unspecified whether with hypoxia or hypercapnia: Secondary | ICD-10-CM

## 2013-09-14 DIAGNOSIS — R5381 Other malaise: Secondary | ICD-10-CM | POA: Diagnosis present

## 2013-09-14 DIAGNOSIS — L8991 Pressure ulcer of unspecified site, stage 1: Secondary | ICD-10-CM | POA: Diagnosis present

## 2013-09-14 DIAGNOSIS — F411 Generalized anxiety disorder: Secondary | ICD-10-CM | POA: Diagnosis present

## 2013-09-14 DIAGNOSIS — Z515 Encounter for palliative care: Secondary | ICD-10-CM

## 2013-09-14 DIAGNOSIS — Z79899 Other long term (current) drug therapy: Secondary | ICD-10-CM

## 2013-09-14 DIAGNOSIS — E785 Hyperlipidemia, unspecified: Secondary | ICD-10-CM | POA: Diagnosis present

## 2013-09-14 DIAGNOSIS — F29 Unspecified psychosis not due to a substance or known physiological condition: Secondary | ICD-10-CM | POA: Diagnosis present

## 2013-09-14 DIAGNOSIS — J4489 Other specified chronic obstructive pulmonary disease: Secondary | ICD-10-CM | POA: Diagnosis present

## 2013-09-14 DIAGNOSIS — IMO0002 Reserved for concepts with insufficient information to code with codable children: Secondary | ICD-10-CM | POA: Diagnosis not present

## 2013-09-14 DIAGNOSIS — R1312 Dysphagia, oropharyngeal phase: Secondary | ICD-10-CM | POA: Diagnosis present

## 2013-09-14 DIAGNOSIS — L89309 Pressure ulcer of unspecified buttock, unspecified stage: Secondary | ICD-10-CM | POA: Diagnosis present

## 2013-09-14 DIAGNOSIS — K219 Gastro-esophageal reflux disease without esophagitis: Secondary | ICD-10-CM | POA: Diagnosis present

## 2013-09-14 DIAGNOSIS — R6520 Severe sepsis without septic shock: Secondary | ICD-10-CM

## 2013-09-14 DIAGNOSIS — R279 Unspecified lack of coordination: Secondary | ICD-10-CM | POA: Diagnosis present

## 2013-09-14 DIAGNOSIS — E440 Moderate protein-calorie malnutrition: Secondary | ICD-10-CM

## 2013-09-14 DIAGNOSIS — A419 Sepsis, unspecified organism: Principal | ICD-10-CM | POA: Diagnosis present

## 2013-09-14 DIAGNOSIS — R270 Ataxia, unspecified: Secondary | ICD-10-CM

## 2013-09-14 DIAGNOSIS — I4892 Unspecified atrial flutter: Secondary | ICD-10-CM | POA: Diagnosis present

## 2013-09-14 DIAGNOSIS — Z87891 Personal history of nicotine dependence: Secondary | ICD-10-CM

## 2013-09-14 DIAGNOSIS — F79 Unspecified intellectual disabilities: Secondary | ICD-10-CM

## 2013-09-14 DIAGNOSIS — Z8701 Personal history of pneumonia (recurrent): Secondary | ICD-10-CM

## 2013-09-14 DIAGNOSIS — Z66 Do not resuscitate: Secondary | ICD-10-CM | POA: Diagnosis not present

## 2013-09-14 DIAGNOSIS — F03918 Unspecified dementia, unspecified severity, with other behavioral disturbance: Secondary | ICD-10-CM

## 2013-09-14 DIAGNOSIS — Z9981 Dependence on supplemental oxygen: Secondary | ICD-10-CM | POA: Diagnosis not present

## 2013-09-14 DIAGNOSIS — R740 Nonspecific elevation of levels of transaminase and lactic acid dehydrogenase [LDH]: Secondary | ICD-10-CM

## 2013-09-14 DIAGNOSIS — R7401 Elevation of levels of liver transaminase levels: Secondary | ICD-10-CM

## 2013-09-14 DIAGNOSIS — J449 Chronic obstructive pulmonary disease, unspecified: Secondary | ICD-10-CM | POA: Diagnosis present

## 2013-09-14 DIAGNOSIS — I10 Essential (primary) hypertension: Secondary | ICD-10-CM | POA: Diagnosis present

## 2013-09-14 DIAGNOSIS — E871 Hypo-osmolality and hyponatremia: Secondary | ICD-10-CM | POA: Diagnosis present

## 2013-09-14 DIAGNOSIS — L8995 Pressure ulcer of unspecified site, unstageable: Secondary | ICD-10-CM | POA: Diagnosis present

## 2013-09-14 DIAGNOSIS — F0391 Unspecified dementia with behavioral disturbance: Secondary | ICD-10-CM | POA: Diagnosis present

## 2013-09-14 DIAGNOSIS — L8992 Pressure ulcer of unspecified site, stage 2: Secondary | ICD-10-CM

## 2013-09-14 DIAGNOSIS — E43 Unspecified severe protein-calorie malnutrition: Secondary | ICD-10-CM

## 2013-09-14 DIAGNOSIS — K2270 Barrett's esophagus without dysplasia: Secondary | ICD-10-CM | POA: Diagnosis present

## 2013-09-14 LAB — COMPREHENSIVE METABOLIC PANEL
ALT: 369 U/L — AB (ref 0–53)
ANION GAP: 12 (ref 5–15)
AST: 162 U/L — ABNORMAL HIGH (ref 0–37)
Albumin: 2.6 g/dL — ABNORMAL LOW (ref 3.5–5.2)
Alkaline Phosphatase: 105 U/L (ref 39–117)
BUN: 13 mg/dL (ref 6–23)
CALCIUM: 8.6 mg/dL (ref 8.4–10.5)
CO2: 25 mEq/L (ref 19–32)
CREATININE: 0.68 mg/dL (ref 0.50–1.35)
Chloride: 98 mEq/L (ref 96–112)
Glucose, Bld: 120 mg/dL — ABNORMAL HIGH (ref 70–99)
Potassium: 4.1 mEq/L (ref 3.7–5.3)
SODIUM: 135 meq/L — AB (ref 137–147)
TOTAL PROTEIN: 7 g/dL (ref 6.0–8.3)
Total Bilirubin: 0.9 mg/dL (ref 0.3–1.2)

## 2013-09-14 LAB — URINALYSIS, ROUTINE W REFLEX MICROSCOPIC
Bilirubin Urine: NEGATIVE
GLUCOSE, UA: NEGATIVE mg/dL
Hgb urine dipstick: NEGATIVE
KETONES UR: NEGATIVE mg/dL
Leukocytes, UA: NEGATIVE
NITRITE: NEGATIVE
PROTEIN: NEGATIVE mg/dL
Specific Gravity, Urine: 1.018 (ref 1.005–1.030)
Urobilinogen, UA: 2 mg/dL — ABNORMAL HIGH (ref 0.0–1.0)
pH: 6.5 (ref 5.0–8.0)

## 2013-09-14 LAB — DIFFERENTIAL
Basophils Absolute: 0 10*3/uL (ref 0.0–0.1)
Basophils Relative: 0 % (ref 0–1)
EOS ABS: 0.1 10*3/uL (ref 0.0–0.7)
Eosinophils Relative: 1 % (ref 0–5)
LYMPHS PCT: 7 % — AB (ref 12–46)
Lymphs Abs: 1.1 10*3/uL (ref 0.7–4.0)
MONO ABS: 1.8 10*3/uL — AB (ref 0.1–1.0)
MONOS PCT: 13 % — AB (ref 3–12)
Neutro Abs: 11.4 10*3/uL — ABNORMAL HIGH (ref 1.7–7.7)
Neutrophils Relative %: 79 % — ABNORMAL HIGH (ref 43–77)

## 2013-09-14 LAB — CBC
HCT: 39.7 % (ref 39.0–52.0)
HEMOGLOBIN: 13.8 g/dL (ref 13.0–17.0)
MCH: 29.4 pg (ref 26.0–34.0)
MCHC: 34.8 g/dL (ref 30.0–36.0)
MCV: 84.5 fL (ref 78.0–100.0)
PLATELETS: 381 10*3/uL (ref 150–400)
RBC: 4.7 MIL/uL (ref 4.22–5.81)
RDW: 15.1 % (ref 11.5–15.5)
WBC: 14.4 10*3/uL — AB (ref 4.0–10.5)

## 2013-09-14 LAB — APTT: aPTT: 30 seconds (ref 24–37)

## 2013-09-14 LAB — AMMONIA: Ammonia: 12 umol/L (ref 11–60)

## 2013-09-14 LAB — MRSA PCR SCREENING: MRSA by PCR: POSITIVE — AB

## 2013-09-14 LAB — I-STAT TROPONIN, ED: Troponin i, poc: 0.02 ng/mL (ref 0.00–0.08)

## 2013-09-14 LAB — PROTIME-INR
INR: 1.4 (ref 0.00–1.49)
PROTHROMBIN TIME: 17.2 s — AB (ref 11.6–15.2)

## 2013-09-14 MED ORDER — SODIUM CHLORIDE 0.9 % IJ SOLN
3.0000 mL | Freq: Two times a day (BID) | INTRAMUSCULAR | Status: DC
  Administered 2013-09-14 – 2013-09-17 (×5): 3 mL via INTRAVENOUS

## 2013-09-14 MED ORDER — ACETAMINOPHEN 325 MG PO TABS
650.0000 mg | ORAL_TABLET | ORAL | Status: DC | PRN
  Administered 2013-09-17: 650 mg via ORAL
  Filled 2013-09-14: qty 2

## 2013-09-14 MED ORDER — LURASIDONE HCL 40 MG PO TABS
20.0000 mg | ORAL_TABLET | Freq: Every day | ORAL | Status: DC
  Administered 2013-09-15 – 2013-09-17 (×3): 20 mg via ORAL
  Filled 2013-09-14 (×3): qty 1

## 2013-09-14 MED ORDER — SODIUM CHLORIDE 0.9 % IV SOLN: 250.0000 mL | INTRAVENOUS | Status: DC | PRN

## 2013-09-14 MED ORDER — ONDANSETRON HCL 4 MG/2ML IJ SOLN: 4.0000 mg | Freq: Four times a day (QID) | INTRAMUSCULAR | Status: DC | PRN

## 2013-09-14 MED ORDER — SODIUM CHLORIDE 0.9 % IJ SOLN
3.0000 mL | INTRAMUSCULAR | Status: DC | PRN
  Administered 2013-09-16: 3 mL via INTRAVENOUS

## 2013-09-14 MED ORDER — ALUM & MAG HYDROXIDE-SIMETH 200-200-20 MG/5ML PO SUSP: 20.0000 mL | Freq: Four times a day (QID) | ORAL | Status: DC | PRN

## 2013-09-14 MED ORDER — LORAZEPAM 2 MG/ML PO CONC: 0.6000 mg | ORAL | Status: DC | PRN

## 2013-09-14 MED ORDER — PANTOPRAZOLE SODIUM 40 MG PO TBEC
40.0000 mg | DELAYED_RELEASE_TABLET | Freq: Two times a day (BID) | ORAL | Status: DC
  Administered 2013-09-15 – 2013-09-17 (×5): 40 mg via ORAL
  Filled 2013-09-14 (×5): qty 1

## 2013-09-14 MED ORDER — ONDANSETRON HCL 4 MG PO TABS: 4.0000 mg | ORAL_TABLET | Freq: Four times a day (QID) | ORAL | Status: DC | PRN

## 2013-09-14 MED ORDER — HYDRALAZINE HCL 20 MG/ML IJ SOLN
10.0000 mg | Freq: Four times a day (QID) | INTRAMUSCULAR | Status: DC | PRN
  Administered 2013-09-15: 10 mg via INTRAVENOUS
  Filled 2013-09-14: qty 1

## 2013-09-14 MED ORDER — AMIODARONE HCL 200 MG PO TABS
400.0000 mg | ORAL_TABLET | Freq: Every day | ORAL | Status: DC
  Administered 2013-09-15: 400 mg via ORAL
  Filled 2013-09-14: qty 2

## 2013-09-14 MED ORDER — POTASSIUM CHLORIDE CRYS ER 20 MEQ PO TBCR
20.0000 meq | EXTENDED_RELEASE_TABLET | Freq: Every morning | ORAL | Status: DC
  Administered 2013-09-15 – 2013-09-17 (×3): 20 meq via ORAL
  Filled 2013-09-14 (×5): qty 1

## 2013-09-14 MED ORDER — ALBUTEROL SULFATE (2.5 MG/3ML) 0.083% IN NEBU: 3.0000 mL | INHALATION_SOLUTION | RESPIRATORY_TRACT | Status: DC | PRN

## 2013-09-14 MED ORDER — FLUTICASONE PROPIONATE HFA 220 MCG/ACT IN AERO
1.0000 | INHALATION_SPRAY | Freq: Two times a day (BID) | RESPIRATORY_TRACT | Status: DC
  Administered 2013-09-15 – 2013-09-16 (×4): 1 via RESPIRATORY_TRACT
  Filled 2013-09-14 (×2): qty 12

## 2013-09-14 MED ORDER — DOCUSATE SODIUM 100 MG PO CAPS
100.0000 mg | ORAL_CAPSULE | Freq: Two times a day (BID) | ORAL | Status: DC
  Administered 2013-09-15 – 2013-09-16 (×3): 100 mg via ORAL
  Filled 2013-09-14 (×6): qty 1

## 2013-09-14 MED ORDER — GUAIFENESIN 100 MG/5ML PO SYRP
200.0000 mg | ORAL_SOLUTION | ORAL | Status: DC | PRN
  Filled 2013-09-14: qty 10

## 2013-09-14 NOTE — Progress Notes (Signed)
Admitted patient from [facility]. Via stretcher,placed on position of comfort in bed,call bell within reached.Slight to moderate tremor noticed on bilateral extremities.Several skin issues observed.Unstageble wound on each bilateral knees,covered with yellowish slough,no drainage,surrounding skin area were intact,each measuring about .5cmx .5cm.Stage 1 on left buttocks -3.5cm x 4 cm.,Unstageable on right buttock -1cm x 1cm,covered with yellowish slough,no drainage ,immediate surrounding area is reddened blanchable and intact.Sacral foam dressing placed.Wound care consult for re-assessment and care done.

## 2013-09-14 NOTE — H&P (Signed)
[facility] Admission History and Physical Service Pager: [phone]  Patient name: Trimaine Maser Medical record number: [id number] Date of birth: 04-10-1934 Age: 79 y.o. Gender: male  Primary Care Provider: Jacquelin Hawking, MD Consultants: none to date; consider neuro Code Status: DNR - need to clarify with caregiver(s)  Chief Complaint: ataxic gait  Assessment and Plan: Ripley Bogosian is a 79 y.o. male presenting with ataxic gait to the clinic earlier today, found to have CXR / labs suggestive of PNA, likely aspiration. Also with incidentally noted transaminitis. PMH significant for severe MR, COPD, HLD, atrial flutter on amiodarone, GERD / esophageal ulcers, hx of GI bleed, hx of aspiration PNA.  #Aspiration PNA - suggested by WBC elevation and CXR findings, though no fever and no O2 requirement, currently - similar CXR findings on admission for asp PNA in March; doubt persistent PNA, but recurrence given chronic aspiration risk is likely - started vanc / Zosyn empirically with plan to monitor for any change in respiratory status, hypoxia, etc - ordered for dysphagia 2 diet with nectar thick liquids and requested SLP eval for further recs - trend CBC, repeat CXR in the AM - Zofran PRN for any nausea / vomiting  #Transaminitis - uncertain etiology, but noted on CMP in the ED; unclear if   #Ataxia - unclear etiology with non-acute CT findings - possibly related to acute illness / lab abnormalities as above - plan to treat / monitor as above, with low threshold for MRI +/- neurology consult and/or further labs, pending improvement or persistence - neuro checks q2 for 12 hours - requested PT / OT eval in the meantime  #Hx atrial flutter - on amiodarone at home, though at some point between now and March dose was increased from 400 mg daily to BID - ordered for 400 mg daily, while admitted; will need to clarify if / why this needs to be BID or not  #GERD with hx  of GI bleed - continue PPI (Protonix per formulary) and avoid anticoagulants - continue PRN Maalox  #Hx COPD / asthma - not in any current respiratory distress, as above - continue albuterol PRN,   #Hx MR / anxiety / ?psychotic disorder - continue scheduled Latuda, PRN Ativan  FEN/GI: Dysphagia 2 diet with pending SLP eval for further recs, saline lock IV Prophylaxis: SCD's, on PPI  Disposition: admit to inpt, med-surg bed, with management as above, attending Dr. McDiarmid; discharge planning pending resolution of acute issues as listed above  History of Present Illness: Mervil Wacker is a 79 y.o. male presenting with abnormal gait / ataxia for the past few days; in the ED, he was found to have CXR findings suggestive of aspiration PNA and transaminitis on labs. PMH significant for severe MR, COPD, HLD, atrial flutter on amiodarone, GERD / esophageal ulcers, hx of GI bleed, hx of aspiration PNA. History obtained mostly from caregiver and chart review; pt unable to provide history due to severe MR.  Per caregiver, he has had several days of abnormal gait without many other symptoms. He usually walks with a walker and has been very slow to pick up his feet and unstable trying to walk. He has not had fever, vomiting, diarrhea, significant complaints of pain, or abnormal mental status apart from his baseline MR. Note he does chronically cough and reswallow secretions; caregiver states "he never spits it out," and he was hospitalized in March of this year for aspiration PNA, as well. He was seen earlier today in clinic  with very unsteady gait while using walker.  Review Of Systems: UNABLE TO OBTAIN SECONDARY TO MENTAL STATUS (severe MR); Level V caveat  Patient Active Problem List   Diagnosis Date Noted  . Ataxia 09/18/2013  . Aspiration pneumonia 09/07/2013  . vomiting 08/15/2013  . Other dysphagia 06/18/2013  . Dementia with behavioral disturbance 04/20/2013  . Atrial flutter 04/11/2013  .  Malnutrition of moderate degree 04/08/2013  . Well adult exam 11/27/2012  . Allergic rhinitis 04/04/2010  . ECZEMA 12/01/2009  . TOBACCO USE, QUIT 07/07/2009  . CHOLELITHIASIS 04/06/2008  . ESOPHAGEAL ULCER, WITH BLEEDING 08/17/2007  . BARRETTS ESOPHAGUS 08/17/2007  . GI BLEEDING 08/05/2007  . COPD 04/01/2007  . HYPERLIPIDEMIA 05/05/2006  . ANEMIA, IRON DEFICIENCY NOS 05/05/2006  . PSYCHOSIS, UNSPECIFIED 03/20/2006  . MENTAL RETARDATION 03/20/2006  . ASTHMA, INTERMITTENT 03/20/2006  . GASTROESOPHAGEAL REFLUX, NO ESOPHAGITIS 03/20/2006  . HERNIA, HIATAL, NONCONGENITAL 03/20/2006   Past Medical History: Past Medical History  Diagnosis Date  . COPD (chronic obstructive pulmonary disease)   . History of stomach ulcers   . Retardation   . Anemia   . Hypertension   . GERD (gastroesophageal reflux disease)   . Dementia    Past Surgical History: Past Surgical History  Procedure Laterality Date  . Gastrectomy     Social History: History  Substance Use Topics  . Smoking status: Former Games developer  . Smokeless tobacco: Not on file  . Alcohol Use: No   Please also refer to relevant sections of EMR.  Family History: History reviewed. No pertinent family history. Allergies and Medications: Allergies  Allergen Reactions  . Penicillins     Unsure of reaction   No current facility-administered medications on file prior to encounter.   Current Outpatient Prescriptions on File Prior to Encounter  Medication Sig Dispense Refill  . albuterol (PROAIR HFA) 108 (90 BASE) MCG/ACT inhaler Inhale 2 puffs into the lungs every 4 (four) hours as needed for wheezing or shortness of breath. Use with spacer      . clotrimazole (LOTRIMIN) 1 % cream Apply 1 application topically daily. Apply to buttocks for yeast      . docusate sodium (COLACE) 100 MG capsule Take 100 mg by mouth 2 (two) times daily. Scheduled      . LORazepam (ATIVAN) 2 MG/ML concentrated solution Take 0.3 mLs (0.6 mg total) by  mouth every 4 (four) hours as needed for anxiety, seizure, sedation or sleep.  30 mL  0  . lurasidone (LATUDA) 40 MG TABS tablet Take 0.5 tablets (20 mg total) by mouth daily.  90 tablet  3  . omeprazole (PRILOSEC) 20 MG capsule Take 20 mg by mouth 2 (two) times daily before a meal.       . potassium chloride SA (K-DUR,KLOR-CON) 20 MEQ tablet Take 20 mEq by mouth every morning.        Objective: BP 141/75  Pulse 69  Temp(Src) 98.3 F (36.8 C) (Oral)  Resp 16  SpO2 97% Exam: Gen: elderly adult male in NAD, coughing but reswallowing secretions HEENT: AT/[place], MM slightly dry but no frank emesis or sputum noted Cardio: RRR, no murmur appreciated Pulm: diffusely coarse but difficult to exam; normal effort and saturation on A Abd: soft, nontender, BS+ Neuro: awake, alert, unable to participate in conversation follow commands for exam, severe MR  Moves all extremities spontaneously Skin: no frank ulcerations note; healing abrasion to right lateral leg  Labs and Imaging: CBC BMET   Recent Labs Lab 09/04/2013 1305  WBC 14.4*  HGB 13.8  HCT 39.7  PLT 381    Recent Labs Lab Oct 07, 2013 1305  NA 135*  K 4.1  CL 98  CO2 25  BUN 13  CREATININE 0.68  GLUCOSE 120*  CALCIUM 8.6      Recent Labs Lab October 07, 2013 1305  NA 135*  K 4.1  CL 98  CO2 25  GLUCOSE 120*  BUN 13  CREATININE 0.68  CALCIUM 8.6  ALKPHOS 105  AST 162*  ALT 369*  ALBUMIN 2.6*   POC troponin 8/25: negative Ammonia 8/25: 12 (WNL)  CT Head 8/25 : motion-degraded, but no acute abnormality; chronic small-vessel disease CXR 8/25 : diffuse interstitial disease, but recurrent or persistent LLL PNA (compared to March 2015)  Bobbye Morton, MD 10-07-2013, 4:23 PM PGY-3, [facility] Family Medicine FPTS Intern pager: [phone], text pages welcome

## 2013-09-14 NOTE — Assessment & Plan Note (Addendum)
Acute; Concern for cerebellar process (ischemia, hemorrhage, neoplasm) vs medication overdose, though aid worker states he has not had any medication issues. No fevers or chills making infection unlikely. No reported h/o trauma. Unable to obtain hx from pt given severe MR and dementia. Also consider parkinson dementia although this is much less likely to cause an acute presentation. Fine tremor is reportedly chronic. - Favor admission, no beds available. - Sending through ED for workup including metabolic panel, CBC, head CT. - Care providers notified and voice understanding. They decline EMS and will transport patient and request wheelchair on arrival to ED. - Called charge nurse in ED along with inpatient FPTS team to notify.

## 2013-09-14 NOTE — ED Notes (Signed)
Pt transported to CT ?

## 2013-09-14 NOTE — Patient Instructions (Signed)
[name] [name] is being sent to the Emergency Room for further evaluation of his gait ataxia. He will likely need admission for evaluation and to coordinate any further physical therapy needs.  Leona Singleton, MD

## 2013-09-14 NOTE — ED Notes (Signed)
Caregivers report pt having unsteady gait since Sunday, went to pcp office today but sent here for possible admission and stroke workup. Denies any other neuro symptoms. Pt has congestion and productive cough at triage but they state that is normal for pt.

## 2013-09-14 NOTE — ED Provider Notes (Signed)
CSN: [id number]     Arrival date & time 09/19/2013  1209 History   First MD Initiated Contact with Patient 09/19/2013 1321     Chief Complaint  Patient presents with  . Gait Problem   HPI Difficult to obtain history as patient is not able to communicate; history gathered from the man who runs his group home and from [facility] note (from earlier today). Patient has reportedly been experiencing an ataxic gait for 2 days; this differs from baseline where he ambulates well with walker (and sometimes doesn't even need the walker). He has not had any other neurologic changes (weakness, vision/hearing changes, worsened mentation), chills, fevers, falls, trauma, changes in medication or PO intake, diarrhea or vomiting. He was sent to ED over concern at [facility] for a cerebellar process (ischemia vs hemorrhage vs neoplasm) vs medication overdose (though caregivers assert that his medications have been given normally).   Past Medical History  Diagnosis Date  . COPD (chronic obstructive pulmonary disease)   . History of stomach ulcers   . Retardation   . Anemia   . Hypertension   . GERD (gastroesophageal reflux disease)   . Dementia    Past Surgical History  Procedure Laterality Date  . Gastrectomy     History reviewed. No pertinent family history. History  Substance Use Topics  . Smoking status: Former Games developer  . Smokeless tobacco: Not on file  . Alcohol Use: No    Review of Systems General: no recent illness Skin: no rashes HEENT: difficult to determine Cardiac: difficult to determine Respiratory: cough at baseline GI: difficult to determine, but no vomiting or changes in BMs Urinary: difficult to determine, but no changes in frequency Msk: difficult to determine Neurologic: no change in mentation, no change in baseline tremor Psychiatric: difficult to determine  Allergies  Penicillins  Home Medications   Prior to Admission medications   Medication Sig Start Date End Date  Taking? Authorizing Provider  albuterol (PROAIR HFA) 108 (90 BASE) MCG/ACT inhaler Inhale 2 puffs into the lungs every 4 (four) hours as needed for wheezing or shortness of breath. Use with spacer    Historical Provider, MD  amiodarone (PACERONE) 400 MG tablet Take 1 tablet (400 mg total) by mouth daily. 04/19/13   Myra Rude, MD  clotrimazole (LOTRIMIN) 1 % cream Apply 1 application topically daily. Apply to buttocks for yeast    Historical Provider, MD  docusate sodium (COLACE) 100 MG capsule Take 100 mg by mouth 2 (two) times daily. Scheduled    Historical Provider, MD  fluticasone (FLOVENT HFA) 220 MCG/ACT inhaler Inhale 1 puff into the lungs 2 (two) times daily. 05/20/13   Tommie Sams, DO  LORazepam (ATIVAN) 2 MG/ML concentrated solution Take 0.3 mLs (0.6 mg total) by mouth every 4 (four) hours as needed for anxiety, seizure, sedation or sleep. 04/19/13   Abram Sander, MD  lurasidone (LATUDA) 40 MG TABS tablet Take 0.5 tablets (20 mg total) by mouth daily. 05/28/13   Jacquelin Hawking, MD  Multiple Vitamins-Minerals (MULTIVITAMIN WITH MINERALS) tablet Take 1 tablet by mouth daily.    Historical Provider, MD  omeprazole (PRILOSEC) 20 MG capsule Take 20 mg by mouth daily.    Historical Provider, MD  potassium chloride SA (K-DUR,KLOR-CON) 20 MEQ tablet Take 20 mEq by mouth every morning.    Historical Provider, MD  Skin Protectants, Misc. (EUCERIN) cream Apply topically daily. 05/20/13   Tommie Sams, DO  triamcinolone ointment (KENALOG) 0.1 % Apply 1  application topically 2 (two) times daily as needed. 05/20/13   Jayce G Cook, DO   BP 125/56  Pulse 73  Temp(Src) 98.3 F (36.8 C) (Oral)  Resp 18  SpO2 93% Physical Exam Appearance: lying in bed, smiling, coughing HEENT: AT/[place], PERRL, EOMi, poor dentition Heart: RRR, normal S1S2 Lungs: normal effort, expiratory wheezes throughout Abdomen: BS+, no tenderness to palpation Extremities: no edema b/l Neurologic: awake, alert, mumbles and says "hi",  communicates more easily with caregiver, "J", who is at bedside - asks "stay with me", severe mental retardation, resting tremor in the RUE Skin: right shoulder lesion  ED Course  Procedures (including critical care time) Labs Review Labs Reviewed  PROTIME-INR - Abnormal; Notable for the following:    Prothrombin Time 17.2 (*)    All other components within normal limits  CBC - Abnormal; Notable for the following:    WBC 14.4 (*)    All other components within normal limits  DIFFERENTIAL - Abnormal; Notable for the following:    Neutrophils Relative % 79 (*)    Neutro Abs 11.4 (*)    Lymphocytes Relative 7 (*)    Monocytes Relative 13 (*)    Monocytes Absolute 1.8 (*)    All other components within normal limits  COMPREHENSIVE METABOLIC PANEL - Abnormal; Notable for the following:    Sodium 135 (*)    Glucose, Bld 120 (*)    Albumin 2.6 (*)    AST 162 (*)    ALT 369 (*)    All other components within normal limits  APTT  URINALYSIS, ROUTINE W REFLEX MICROSCOPIC  AMMONIA  I-STAT TROPOININ, ED    Imaging Review Ct Head (brain) Wo Contrast  2013/09/20   CLINICAL DATA:  Unsteady gait, fall  EXAM: CT HEAD WITHOUT CONTRAST  TECHNIQUE: Contiguous axial images were obtained from the base of the skull through the vertex without intravenous contrast.  COMPARISON:  04/07/2013  FINDINGS: Motion degraded images.  No evidence of parenchymal hemorrhage or extra-axial fluid collection. No mass lesion, mass effect, or midline shift.  No CT evidence of acute infarction.  Subcortical white matter and periventricular small vessel ischemic changes. Intracranial atherosclerosis  Mild cortical atrophy.  No ventriculomegaly.  The visualized paranasal sinuses are essentially clear. The mastoid air cells are unopacified.  No evidence of calvarial fracture.  IMPRESSION: Motion degraded images.  No evidence of acute intracranial abnormality.  Atrophy with small vessel ischemic changes and intracranial  atherosclerosis.   Electronically Signed   By: Charline Bills M.D.   On: 2013-09-20 14:00     EKG Interpretation   Date/Time:  Tuesday 2013/09/20 12:55:43 EDT Ventricular Rate:  69 PR Interval:  158 QRS Duration: 104 QT Interval:  442 QTC Calculation: 473 R Axis:   78 Text Interpretation:  Normal sinus rhythm Possible Left atrial enlargement  Left ventricular hypertrophy Cannot rule out Septal infarct , age  undetermined Abnormal ECG No significant change since last tracing  Confirmed by GOLDSTON  MD, SCOTT ([id number]) on 09-20-2013 1:33:52 PM      MDM   Final diagnoses:  None    [name] [name] is a 79 yo man with MR, dementia and COPD who is here from a group home with a 2 day history of ataxic gait. CT head was a poor study, but read as negative. EKG was significant for possible left atrial/ventricular enlargement, unchanged. Labs were significant for transaminitis (AST 162 and ALT 369), slight hyponatremia, hypoalbuminemia, leukocytosis (WBC 14.4, which is baseline).  UA and CXR were ordered. He will be admitted for further workup of his new onset ataxia.    Dionne Ano, MD 10-11-13 [phone]

## 2013-09-14 NOTE — ED Notes (Signed)
MD at bedside. Admitting  

## 2013-09-14 NOTE — ED Notes (Signed)
MD at bedside. 

## 2013-09-14 NOTE — Progress Notes (Signed)
Patient ID: [name] [name], male   DOB: 26-Mar-1934, 79 y.o.   MRN: [id number]  HPI:  Pt presents for a same day appointment to discuss abnormal gait. He is accompanied by his care workers. Level V Caveat as patient is unable to provide history. Care workers state that he has over the past two days had trouble walking, specifically seeming like he cannot stop himself and wants to fall. At baseline prior to this, he was ambulating with a walker (and sometimes did not even seem like he needed it) but did not require further aid with ambulation. He has not had any other neurologic changes including no weakness in arms or legs, noticeable problems with vision or hearing, facial droop, or worsened mentation compared to his baseline, which is severely affected due to MR. He lives in a group home. He has not had fevers, chills, medication changes, falls, trauma, diarrhea, vomiting, change in PO, or other change they can notice.   ROS: See HPI  PMFSH: Lives in group home  PHYSICAL EXAM: BP 120/67  Pulse 77  Temp(Src) 98.1 F (36.7 C) (Oral)  Ht  (1.727 m)  Wt 127 lb (57.607 kg)  BMI 19.31 kg/m2 Gen: NAD HEENT: AT/[place], o/p clear, sclera clear Lungs: Normal effort Neuro: Awake, alert, unable to participate in conversation, severe MR, gait with walker requires aid to hold him by belt strap to prevent him from falling forward as he wants to start running. Fine tremor present in right upper extremity.  ASSESSMENT/PLAN:  # Ataxia Acute; Concern for cerebellar process (ischemia, hemorrhage, neoplasm) vs medication overdose, though aid worker states he has not had any medication issues. No fevers or chills making infection unlikely. No reported h/o trauma. Unable to obtain hx from pt given severe MR and dementia. Also consider parkinson dementia although this is much less likely to cause an acute presentation. Fine tremor is reportedly chronic. - Favor admission, no beds available. - Sending through ED  for workup including metabolic panel, CBC, head CT. - Care providers notified and voice understanding. They decline EMS and will transport patient and request wheelchair on arrival to ED. - Called charge nurse in ED along with inpatient FPTS team to notify.   Leona Singleton, MD PGY-3, [facility]

## 2013-09-14 NOTE — ED Notes (Signed)
Patient transported to X-ray 

## 2013-09-14 NOTE — H&P (Signed)
I have seen and examined this patient. I have discussed with Dr Casper Harrison.  I agree with their findings and plans as documented in their admission note.  Possible Aspiration Pneumonia Moderate Oropharyngeal Dysphagia COPD with chronic respiratory failure on home O2 Dementia Intellectually Disabled Lacks capacity to make medical decisions [place] State: Legal Guardian [name] [name] [phone]) or [phone])  DNR(+) in April 2015 NH admission   Palliative Care Consult (Drs Phillips Odor & M. Ladona Ridgel) 3/19-26/2015:[name] [name] DSS guardian endorsed palliative recommendationsComfort care goals, (+) DNR/DNI.  No Feeding tubes, comfort feeds, do not reinitiate antibiotics, Appropriate candidate for hospice, do not rehospitalize  FMTS team will need to discuss goals of care for [name] with his legal guardian as I am not certain our current therapeutic regiment is entirely in line with the comfort care goals from 04/15/13 Palliative Care discussion.

## 2013-09-15 ENCOUNTER — Inpatient Hospital Stay (HOSPITAL_COMMUNITY): Payer: Medicare Other

## 2013-09-15 DIAGNOSIS — F29 Unspecified psychosis not due to a substance or known physiological condition: Secondary | ICD-10-CM

## 2013-09-15 DIAGNOSIS — J690 Pneumonitis due to inhalation of food and vomit: Secondary | ICD-10-CM

## 2013-09-15 LAB — CBC
HCT: 40.4 % (ref 39.0–52.0)
Hemoglobin: 14.5 g/dL (ref 13.0–17.0)
MCH: 29.1 pg (ref 26.0–34.0)
MCHC: 35.9 g/dL (ref 30.0–36.0)
MCV: 81 fL (ref 78.0–100.0)
Platelets: 414 10*3/uL — ABNORMAL HIGH (ref 150–400)
RBC: 4.99 MIL/uL (ref 4.22–5.81)
RDW: 15 % (ref 11.5–15.5)
WBC: 14.8 10*3/uL — AB (ref 4.0–10.5)

## 2013-09-15 LAB — COMPREHENSIVE METABOLIC PANEL
ALT: 348 U/L — ABNORMAL HIGH (ref 0–53)
ANION GAP: 13 (ref 5–15)
AST: 164 U/L — ABNORMAL HIGH (ref 0–37)
Albumin: 2.6 g/dL — ABNORMAL LOW (ref 3.5–5.2)
Alkaline Phosphatase: 105 U/L (ref 39–117)
BUN: 13 mg/dL (ref 6–23)
CHLORIDE: 97 meq/L (ref 96–112)
CO2: 25 mEq/L (ref 19–32)
CREATININE: 0.64 mg/dL (ref 0.50–1.35)
Calcium: 8.4 mg/dL (ref 8.4–10.5)
GFR calc Af Amer: >90 mL/min (ref >90)
GFR calc non Af Amer: >90 mL/min (ref >90)
GLUCOSE: 72 mg/dL (ref 70–99)
Potassium: 3.9 mEq/L (ref 3.7–5.3)
Sodium: 135 mEq/L — ABNORMAL LOW (ref 137–147)
Total Bilirubin: 1.3 mg/dL — ABNORMAL HIGH (ref 0.3–1.2)
Total Protein: 6.9 g/dL (ref 6.0–8.3)

## 2013-09-15 MED ORDER — CLINDAMYCIN HCL 300 MG PO CAPS
300.0000 mg | ORAL_CAPSULE | Freq: Three times a day (TID) | ORAL | Status: DC
  Administered 2013-09-15 – 2013-09-17 (×7): 300 mg via ORAL
  Filled 2013-09-15 (×9): qty 1

## 2013-09-15 MED ORDER — LORAZEPAM 2 MG/ML IJ SOLN: 0.5000 mg | INTRAMUSCULAR | Status: DC | PRN

## 2013-09-15 MED ORDER — MUPIROCIN 2 % EX OINT
1.0000 | TOPICAL_OINTMENT | Freq: Two times a day (BID) | CUTANEOUS | Status: DC
Start: 2013-09-15 — End: 2013-09-17
  Administered 2013-09-15 – 2013-09-17 (×6): 1 via NASAL
  Filled 2013-09-15: qty 22

## 2013-09-15 MED ORDER — CHLORHEXIDINE GLUCONATE CLOTH 2 % EX PADS
6.0000 | MEDICATED_PAD | Freq: Every day | CUTANEOUS | Status: DC
  Administered 2013-09-15 – 2013-09-17 (×3): 6 via TOPICAL

## 2013-09-15 MED ORDER — CETYLPYRIDINIUM CHLORIDE 0.05 % MT LIQD
7.0000 mL | Freq: Two times a day (BID) | OROMUCOSAL | Status: DC
  Administered 2013-09-15 – 2013-09-17 (×5): 7 mL via OROMUCOSAL

## 2013-09-15 NOTE — Progress Notes (Signed)
Chair pad used when patient seated on the recliner.

## 2013-09-15 NOTE — ED Provider Notes (Signed)
I saw and evaluated the patient, reviewed the resident's note and I agree with the findings and plan.   EKG Interpretation   Date/Time:  Tuesday September 14 2013 12:55:43 EDT Ventricular Rate:  69 PR Interval:  158 QRS Duration: 104 QT Interval:  442 QTC Calculation: 473 R Axis:   78 Text Interpretation:  Normal sinus rhythm Possible Left atrial enlargement  Left ventricular hypertrophy Cannot rule out Septal infarct , age  undetermined Abnormal ECG No significant change since last tracing  Confirmed by Venice Marcucci  MD, Bowman Higbie ([id number]) on 08/28/2013 1:33:52 PM       Patient with new onset trouble walking. Patient is not normal at baseline, but is not altered. CT head and basic workup negative. Admit to family practice.  Audree Camel, MD 09/15/13 [phone]

## 2013-09-15 NOTE — Consult Note (Signed)
WOC wound consult note Reason for Consult: evaluation of pressure ulcers. Pt from group home, not able to really tell me history.  He smiles and does speak but not conversational with me.  Wound type: Stage I Pressure Ulcer left ischium; Unstageable Pressure Ulcer right ischium.  He is noted to have two scabs on the bilateral knees I would assume from trauma with fall or bumping them due to his reported new gait issues. They are not infected and are dry and stable  Pressure Ulcer POA: Yes Measurement: Left ischium: 3cm x 4cm x 0; intact, red, does not completely blanch at some points Right ischium: 1.0cm x 1.0cm x 0.2cm; 100% yellow slough Wound bed: see above Drainage (amount, consistency, odor) none Periwound:intact Dressing procedure/placement/frequency: Sacral prophylatic dressing in place Add foam for the left and right ishcial wounds since this current dressing does not cover or protect these sites. Could switch to the heel/sacral [place] # [phone]) which would cover and protect all areas and provide prophylactic coverage of the sacrum if needed. Turn and reposition every 2 hours.  I am not sure if he uses WC at group home or sits in a regular chair but the chair pad will help with pressure redistribution of these ulcers.  Discussed POC with patient and bedside nurse.  Re consult if needed, will not follow at this time. Thanks  Jrue Jarriel [employer], CWOCN [phone])

## 2013-09-15 NOTE — Evaluation (Signed)
Occupational Therapy Evaluation Patient Details Name: [name] [name] MRN: [id number] DOB: 06/03/1934 Today's Date: 09/15/2013    History of Present Illness Pt admitted with suspected aspiration PNA and more unsteady gait than baseline.  PMHx:  severe MR, ataxia, aflutter, GERD, GIB, and aspiration PNA.   Clinical Impression   At baseline, pt is assisted for all ADL.  He currently stands to shower per his case manager (unable to reach group home staff by phone).  May be appropriate for a shower seat for home safety as he is very unsteady on his feet.  While the home is one level with a level entry, it does not have grab bars. Will follow to assist with identifying optimal tub equipment.    Follow Up Recommendations  Supervision/Assistance - 24 hour (return to group home)    Equipment Recommendations  Tub/shower seat (will recommend to group home staff)    Recommendations for Other Services       Precautions / Restrictions Precautions Precautions: Fall      Mobility Bed Mobility Overal bed mobility: Needs Assistance Bed Mobility: Supine to Sit     Supine to sit: Min assist        Transfers Overall transfer level: Needs assistance   Transfers: Sit to/from Stand Sit to Stand: +2 physical assistance;Min assist              Balance Overall balance assessment: Needs assistance Sitting-balance support: No upper extremity supported;Feet supported Sitting balance-Leahy Scale: Fair       Standing balance-Leahy Scale: Poor                              ADL Overall ADL's : At baseline                                       General ADL Comments: Pt steps over the edge of the tub at baseline and stands while staff bathes him. Could benefit from tub seat?     Vision                     Perception     Praxis      Pertinent Vitals/Pain Pain Assessment: Faces Faces Pain Scale: Hurts a little bit (pt noted to grimace with bed  mobility and when feet elevated) Pain Location: unclear Pain Intervention(s): Repositioned     Hand Dominance Right   Extremity/Trunk Assessment Upper Extremity Assessment Upper Extremity Assessment: RUE deficits/detail RUE Deficits / Details: ataxia, caregiver reports this is baseline RUE Coordination: decreased fine motor;decreased gross motor   Lower Extremity Assessment Lower Extremity Assessment: Defer to PT evaluation       Communication Communication Communication: Receptive difficulties;Expressive difficulties   Cognition Arousal/Alertness: Awake/alert Behavior During Therapy: Impulsive Overall Cognitive Status: History of cognitive impairments - at baseline                     General Comments       Exercises       Shoulder Instructions      Home Living Family/patient expects to be discharged to:: Group home                                        Prior Functioning/Environment Level  of Independence: Needs assistance  Gait / Transfers Assistance Needed: close supervision with RW ADL's / Homemaking Assistance Needed: assisted for bathing, dressing, toileting and feeding        OT Diagnosis: Generalized weakness;Acute pain;Cognitive deficits;Ataxia   OT Problem List: Decreased strength;Pain;Impaired UE functional use;Impaired tone;Decreased knowledge of use of DME or AE;Decreased cognition;Decreased coordination;Impaired balance (sitting and/or standing);Decreased activity tolerance   OT Treatment/Interventions: DME and/or AE instruction    OT Goals(Current goals can be found in the care plan section) Acute Rehab OT Goals Patient Stated Goal: unable to state OT Goal Formulation: Patient unable to participate in goal setting Time For Goal Achievement: 09/22/13 Potential to Achieve Goals: Good  OT Frequency: Min 2X/week   Barriers to D/C:            Co-evaluation PT/OT/SLP Co-Evaluation/Treatment: Yes Reason for  Co-Treatment: For patient/therapist safety   OT goals addressed during session: ADL's and self-care      End of Session Nurse Communication:  (RN ok to remove mitts)  Activity Tolerance: Patient limited by fatigue Patient left: in chair;with call bell/phone within reach;with chair alarm set   Time: 1610-9604 OT Time Calculation (min): 30 min Charges:  OT General Charges $OT Visit: 1 Procedure OT Evaluation $Initial OT Evaluation Tier I: 1 Procedure OT Treatments $Self Care/Home Management : 8-22 mins G-Codes:    Evern Bio 09/15/2013, 11:56 AM [phone]

## 2013-09-15 NOTE — Progress Notes (Signed)
Family Medicine Teaching Service Daily Progress Note Intern Pager: [phone]  Patient name: [name] [name] Medical record number: [id number] Date of birth: 1934/05/15 Age: 79 y.o. Gender: male  Primary Care Provider: Jacquelin Hawking, MD Consultants: none Code Status: FULL CODE per Lynnell Dike  Pt Overview and Major Events to Date:  09-21-13: Question about DNR status.  Assessment and Plan:  [name] [name] is a 79 y.o. male presenting with ataxic gait to the clinic earlier today, found to have CXR / labs suggestive of PNA, likely aspiration. Also with incidentally noted transaminitis. PMH significant for severe [name], COPD, HLD, atrial flutter on amiodarone, GERD / esophageal ulcers, hx of GI bleed, hx of aspiration PNA.   #Aspiration PNA - suggested by WBC elevation and CXR findings, though no fever and no O2 requirement, currently  - Code status/ care discussed with care manager Lynnell Dike [phone]) and she stated, "I have not received a note saying he is terminal from our team.  He is a full code not a DNR/DNI.  Call me back if his situation worsens, and I will make decisions then" -Palliative care consulted.  Patient to be evaluated this evening or tomorrow morning as time permits.  PC will contact Ms Earlene Plater and have a phone conference regarding patient's long term plan. - similar CXR findings on admission for asp PNA in March; doubt persistent PNA, but recurrence given chronic aspiration risk is likely  - started vanc / Zosyn empirically with plan to monitor for any change in respiratory status, hypoxia, etc  - ordered for dysphagia 2 diet with nectar thick liquids and requested SLP eval for further recs  - trend CBC, repeat CXR in the AM  - Zofran PRN for any nausea / vomiting   #Transaminitis - uncertain etiology, but noted on CMP in the ED -likely d/t amiodarone dose at group home -amiodarone level ordered, patient's BP and HR are within normal limits at this time. -amiodarone dc'd  for now.  #Ataxia - unclear etiology with non-acute CT findings  - possibly related to acute illness / lab abnormalities as above  - plan to treat / monitor as above, with low threshold for MRI +/- neurology consult and/or further labs, pending improvement or persistence  - neuro checks q2 for 12 hours  - requested PT / OT eval in the meantime   #Hx atrial flutter - on amiodarone at home, though at some point between now and March dose was increased from 400 mg daily to BID  - ordered for 400 mg daily, while admitted; will need to clarify if / why this needs to be BID or not   #GERD with hx of GI bleed - continue PPI (Protonix per formulary) and avoid anticoagulants  - continue PRN Maalox   #Hx COPD / asthma - not in any current respiratory distress, as above  - continue albuterol PRN  #Hx [name] / anxiety / ?psychotic disorder - continue scheduled Latuda, PRN Ativan   FEN/GI: Dysphagia 2 diet with pending SLP eval for further recs, saline lock IV  Prophylaxis: SCD's, on PPI   Disposition: Discharge planning pending resolution of acute illness.  Subjective:  Information not obtainable as [name] [name] is unable to articulate any questions/concerns d/t underlying chronic conditions.  Objective: Temp:  [97.6 F (36.4 C)-98.7 F (37.1 C)] 97.6 F (36.4 C) (08/26 0603) Pulse Rate:  [68-80] 80 (08/26 0603) Resp:  [16-22] 19 (08/26 0603) BP: (120-170)/(56-139) 133/67 mmHg (08/26 0603) SpO2:  [93 %-99 %] 94 % (08/26  1610) Weight:  [125 lb 3.5 oz (56.8 kg)-127 lb (57.607 kg)] 125 lb 3.5 oz (56.8 kg) (08/25 2300) Physical Exam: General: elderly white male, resting in bed, NAD Cardiovascular: difficult to assess over coarse breath sounds but seemingly RRR Respiratory: globally coarse breath sounds, normal work of breathing, good saturation (95%) on room air. Abdomen: soft, NT/ND, +BS x4 Extremities: WWP, thin Neuro: does not communicate clearly (can make out some words), does follow  commands, moves extremities spontaneously, gait not assessed.  Laboratory:  Recent Labs Lab 20-Sep-2013 1305 09/15/13 0601  WBC 14.4* 14.8*  HGB 13.8 14.5  HCT 39.7 40.4  PLT 381 414*    Recent Labs Lab September 20, 2013 1305  NA 135*  K 4.1  CL 98  CO2 25  BUN 13  CREATININE 0.68  CALCIUM 8.6  PROT 7.0  BILITOT 0.9  ALKPHOS 105  ALT 369*  AST 162*  GLUCOSE 120*      Imaging/Diagnostic Tests: Dg Chest 2 View  20-Sep-2013   CLINICAL DATA:  COPD exacerbation.  EXAM: CHEST  2 VIEW  COMPARISON:  Portable chest x-rays 04/10/2013 dating back to 04/07/2013. Two-view chest x-ray 12/12/2011.  FINDINGS: Cardiac silhouette mildly enlarged, increased in size since 2013. Thoracic aorta mildly atherosclerotic, unchanged. Small hiatal hernia, unchanged. Hilar and mediastinal contours otherwise unremarkable. Interval improved aeration in both lungs since the March, 2015 examinations. Focal airspace consolidation in the left lower lobe. Diffuse interstitial lung disease has progressed significantly since 2013. Scattered areas of focal scarring in both lungs, also progressive.  IMPRESSION: 1. Diffuse interstitial lung disease with scattered areas of scarring throughout both lungs, progressive since 2013. 2. Improved aeration in both lungs since the examinations from March, 2013 when the patient had multifocal pneumonia. Persistent or recurrent pneumonia in the left lower lobe is suspected. 3. Mild cardiomegaly with interval increase in heart size since 2013. No evidence of pulmonary edema. 4. Stable small hiatal hernia.   Electronically Signed   By: Hulan Saas M.D.   On: 09-20-2013 15:18   Ct Head (brain) Wo Contrast  2013-09-20   CLINICAL DATA:  Unsteady gait, fall  EXAM: CT HEAD WITHOUT CONTRAST  TECHNIQUE: Contiguous axial images were obtained from the base of the skull through the vertex without intravenous contrast.  COMPARISON:  04/07/2013  FINDINGS: Motion degraded images.  No evidence of  parenchymal hemorrhage or extra-axial fluid collection. No mass lesion, mass effect, or midline shift.  No CT evidence of acute infarction.  Subcortical white matter and periventricular small vessel ischemic changes. Intracranial atherosclerosis  Mild cortical atrophy.  No ventriculomegaly.  The visualized paranasal sinuses are essentially clear. The mastoid air cells are unopacified.  No evidence of calvarial fracture.  IMPRESSION: Motion degraded images.  No evidence of acute intracranial abnormality.  Atrophy with small vessel ischemic changes and intracranial atherosclerosis.   Electronically Signed   By: Charline Bills M.D.   On: 09/20/2013 14:00    Raliegh Ip, DO 09/15/2013, 6:53 AM PGY-1, [facility] FPT[facility] Intern pager: [phone], text pages welcome

## 2013-09-15 NOTE — Progress Notes (Signed)
I have seen and examined this patient. I have discussed with Dr Nadine Counts.  I agree with their findings and plans as documented in their progress note.  Patient more alert this morning.  Verbalizing without intelligibility. Handfeed eating associated with coughing.  There appears to be a discrepancy between the palliative care plan for patient from March hospitalization and current decision by Lynnell Dike DSS Guardian.  We will need to clarify what the goals of care are for this patient with impaired ADLs, limited verbal capacity, cognitive impairment, and recurrent  Aspiration Pneumonia who likely meets a Hospice enrollment diagnosis criteria under dementia.

## 2013-09-15 NOTE — Evaluation (Signed)
Physical Therapy Evaluation Patient Details Name: [name] [name] MRN: [id number] DOB: 1934-08-17 Today's Date: 09/15/2013   History of Present Illness  Pt admitted with suspected aspiration PNA and more unsteady gait than baseline.  PMHx:  severe MR, ataxia, aflutter, GERD, GIB, and aspiration PNA.  Clinical Impression  Pt presents with generalized weakness and decreased activity tolerance.  Did not note any overt ataxia during gait, but did note tremor in R hand with all mobility which caregiver states is there at baseline.  Would like to see pt ambulate with RW, but did well with +2 HHA (min/mod A).  Recommend continued PT in acute to address deficits.  Recommend HHPT at group home to continue to get pt back to baseline.     Follow Up Recommendations Home health PT;Supervision/Assistance - 24 hour (back to group home)    Equipment Recommendations  None recommended by PT    Recommendations for Other Services       Precautions / Restrictions Precautions Precautions: Fall Precaution Comments: premorbid tremor in RUE, tends to lean anteriorly during gait Restrictions Weight Bearing Restrictions: No      Mobility  Bed Mobility Overal bed mobility: Needs Assistance Bed Mobility: Supine to Sit     Supine to sit: Min assist     General bed mobility comments: HHA for elevating trunk to EOB, tactile cues for initiating tasks.   Transfers Overall transfer level: Needs assistance Equipment used: 2 person hand held assist Transfers: Sit to/from Stand Sit to Stand: +2 physical assistance;Min assist         General transfer comment: Pt did very well with coming into standing, however did note increased posterior lean initially.  Some impulsivity when attempting to stand.   Ambulation/Gait Ambulation/Gait assistance: +2 safety/equipment;Mod assist Ambulation Distance (Feet): 100 Feet Assistive device: 2 person hand held assist Gait Pattern/deviations: Step-through pattern;Trunk  flexed;Shuffle Gait velocity: decreased intiially then increased as he continued   General Gait Details: Pt tolerated ambulation in hallway very well with +2 for safety but at min to mod A.  Note increasing anterior lean with increased speed, therefore provided assist to decrease for safety.   Stairs            Wheelchair Mobility    Modified Rankin (Stroke Patients Only)       Balance Overall balance assessment: Needs assistance Sitting-balance support: No upper extremity supported;Feet supported Sitting balance-Leahy Scale: Fair   Postural control: Right lateral lean (in sitting unsupported) Standing balance support: During functional activity Standing balance-Leahy Scale: Poor                               Pertinent Vitals/Pain Pain Assessment: Faces Faces Pain Scale: Hurts a little bit Pain Location: unclear, pt grimaces with RLE movement Pain Intervention(s): Repositioned    Home Living Family/patient expects to be discharged to:: Group home                      Prior Function Level of Independence: Needs assistance   Gait / Transfers Assistance Needed: close supervision with RW  ADL's / Homemaking Assistance Needed: assisted for bathing, dressing, toileting and feeding        Hand Dominance   Dominant Hand: Right    Extremity/Trunk Assessment   Upper Extremity Assessment: RUE deficits/detail RUE Deficits / Details: ataxia, caregiver reports this is baseline         Lower Extremity Assessment: Generalized weakness  Cervical / Trunk Assessment: Kyphotic  Communication   Communication: Receptive difficulties;Expressive difficulties  Cognition Arousal/Alertness: Awake/alert Behavior During Therapy: Impulsive Overall Cognitive Status: History of cognitive impairments - at baseline                      General Comments      Exercises        Assessment/Plan    PT Assessment Patient needs continued PT  services  PT Diagnosis Difficulty walking;Generalized weakness   PT Problem List Decreased strength;Decreased activity tolerance;Decreased balance;Decreased mobility;Decreased safety awareness;Cardiopulmonary status limiting activity;Decreased knowledge of precautions  PT Treatment Interventions DME instruction;Gait training;Functional mobility training;Therapeutic activities;Therapeutic exercise;Balance training;Patient/family education   PT Goals (Current goals can be found in the Care Plan section) Acute Rehab PT Goals Patient Stated Goal: unable to state PT Goal Formulation: Patient unable to participate in goal setting Time For Goal Achievement: 09/22/13 Potential to Achieve Goals: Good    Frequency Min 3X/week   Barriers to discharge        Co-evaluation PT/OT/SLP Co-Evaluation/Treatment: Yes Reason for Co-Treatment: For patient/therapist safety PT goals addressed during session: Mobility/safety with mobility;Balance OT goals addressed during session: ADL's and self-care       End of Session   Activity Tolerance: Patient tolerated treatment well;Patient limited by fatigue Patient left: in chair;with call bell/phone within reach;with chair alarm set Nurse Communication: Mobility status         Time: 1102 (co-treat with OT)-1132 PT Time Calculation (min): 30 min   Charges:   PT Evaluation $Initial PT Evaluation Tier I: 1 Procedure PT Treatments $Gait Training: 8-22 mins   PT G CodesVista Deck 09/15/2013, 12:37 PM

## 2013-09-15 NOTE — Evaluation (Signed)
Clinical/Bedside Swallow Evaluation Patient Details  Name: [name] [name] MRN: [id number] Date of Birth: Dec 04, 1934  Today's Date: 09/15/2013 Time: 6962-9528 SLP Time Calculation (min): 31 min  Past Medical History:  Past Medical History  Diagnosis Date  . COPD (chronic obstructive pulmonary disease)   . History of stomach ulcers   . Retardation   . Anemia   . Hypertension   . GERD (gastroesophageal reflux disease)   . Dementia    Past Surgical History:  Past Surgical History  Procedure Laterality Date  . Gastrectomy     HPI:  79 y.o. male presenting with ataxic gait found to have CXR / labs suggestive of PNA, likely aspiration. Also with incidentally noted transaminitis. PMH significant for severe MR, COPD, HLD, atrial flutter on amiodarone, GERD / esophageal ulcers, hx of GI bleed, hx of aspiration PNA.  Has had two MBS studies this year (3/25 and 07/02/13), both of which revealed consistent results:  moderate dysphagia with delayed initiation, both silent and audible/sensed aspiration of thin liquids, and oral manipulation deficits.  A dysphagia 2 diet with nectar-thick liquids was recommended as the safest diet.  Per MD notes, pt does chronically cough and reswallow secretions; caregiver states "he never spits it out," and he was hospitalized in March of this year for aspiration PNA.  Pt has been followed by Palliative Medicine and plan was developed in 3/15 for DNR, no feeding tubes, comfort feeds.    Assessment / Plan / Recommendation Clinical Impression  Pt presents with clinical symptoms consistent with findings from two prior MBS studies.  Today, however, there was intermittent coughing associated with consumption of nectar-thick liquids.  Given hx, pt is likely intermittently aspirating, despite diet modifications.  In light of GOC developed during last admission (comfort feeds, no enteral feeding), recommend: 1) continue dysphagia 2 diet, nectar-thick liquids as diet that poses  least risk but best options for flavor/variety; 2) allow pt to self-feed, but provide full supervision to ensure safety with rate and quantity and cue as needed.  Please set-up oral suctioning in room.  SLP to sign off; please call if we can be of further assistance.     Aspiration Risk  Moderate    Diet Recommendation Dysphagia 2 (Fine chop);Nectar-thick liquid   Liquid Administration via: Cup Medication Administration: Whole meds with puree Supervision: Patient able to self feed;Full supervision/cueing for compensatory strategies Compensations: Slow rate;Small sips/bites;Multiple dry swallows after each bite/sip Postural Changes and/or Swallow Maneuvers: Out of bed for meals;Seated upright 90 degrees    Other  Recommendations Oral Care Recommendations: Oral care BID Other Recommendations: Order thickener from pharmacy   Follow Up Recommendations  None           SLP Swallow Goals  n/a   Swallow Study Prior Functional Status       General HPI: 79 y.o. male presenting with ataxic gait found to have CXR / labs suggestive of PNA, likely aspiration. Also with incidentally noted transaminitis. PMH significant for severe MR, COPD, HLD, atrial flutter on amiodarone, GERD / esophageal ulcers, hx of GI bleed, hx of aspiration PNA.  Has had two MBS studies this year (3/25 and 07/02/13), both of which revealed consistent results:  moderate dysphagia with delayed initiation, both silent and audible/sensed aspiration of thin liquids, and oral manipulation deficits.  A dysphagia 2 diet with nectar-thick liquids was recommended as the safest diet.  Per MD notes, pt does chronically cough and reswallow secretions; caregiver states "he never spits it out," and he was  hospitalized in March of this year for aspiration PNA.  Pt has been followed by Palliative Medicine and plan was developed in 3/15 for DNR, no feeding tubes, comfort feeds.  Type of Study: Bedside swallow evaluation Previous Swallow  Assessment: see hx (two prior MBS in 2015) Diet Prior to this Study: Dysphagia 2 (chopped);Nectar-thick liquids Temperature Spikes Noted: No Respiratory Status: Room air Behavior/Cognition: Alert;Cooperative Oral Cavity - Dentition: Missing dentition Self-Feeding Abilities: Able to feed self;Needs assist Patient Positioning: Upright in chair Baseline Vocal Quality: Wet Volitional Cough: Cognitively unable to elicit Volitional Swallow: Unable to elicit    Oral/Motor/Sensory Function Overall Oral Motor/Sensory Function: Impaired at baseline   Ice Chips Ice chips: Not tested   Thin Liquid Thin Liquid: Not tested (due to hx of silent aspiration)    Nectar Thick Nectar Thick Liquid: Impaired Presentation: Cup;Self Fed Oral Phase Impairments: Reduced lingual movement/coordination Pharyngeal Phase Impairments: Suspected delayed Swallow;Multiple swallows;Wet Vocal Quality;Cough - Immediate (intermittent )   Honey Thick Honey Thick Liquid: Not tested   Puree Puree: Impaired Presentation: Self Fed Oral Phase Impairments: Impaired anterior to posterior transit;Reduced lingual movement/coordination Pharyngeal Phase Impairments: Suspected delayed Swallow   Solid   GO    Solid: Impaired (dysphagia 2 consistency) Presentation: Self Fed;Spoon Oral Phase Impairments: Reduced lingual movement/coordination;Impaired anterior to posterior transit Pharyngeal Phase Impairments: Suspected delayed Swallow;Multiple swallows      Travor Royce L. Samson Frederic, [place] CCC/SLP Pager [phone]  Blenda Mounts Laurice 09/15/2013,2:02 PM

## 2013-09-16 DIAGNOSIS — L8990 Pressure ulcer of unspecified site, unspecified stage: Secondary | ICD-10-CM

## 2013-09-16 DIAGNOSIS — L8992 Pressure ulcer of unspecified site, stage 2: Secondary | ICD-10-CM | POA: Diagnosis present

## 2013-09-16 DIAGNOSIS — E43 Unspecified severe protein-calorie malnutrition: Secondary | ICD-10-CM

## 2013-09-16 LAB — BASIC METABOLIC PANEL
ANION GAP: 13 (ref 5–15)
BUN: 21 mg/dL (ref 6–23)
CO2: 24 mEq/L (ref 19–32)
CREATININE: 0.74 mg/dL (ref 0.50–1.35)
Calcium: 8.5 mg/dL (ref 8.4–10.5)
Chloride: 98 mEq/L (ref 96–112)
GFR calc non Af Amer: 87 mL/min — ABNORMAL LOW (ref >90)
Glucose, Bld: 123 mg/dL — ABNORMAL HIGH (ref 70–99)
Potassium: 4.9 mEq/L (ref 3.7–5.3)
Sodium: 135 mEq/L — ABNORMAL LOW (ref 137–147)

## 2013-09-16 LAB — CBC
HCT: 39.8 % (ref 39.0–52.0)
Hemoglobin: 13.7 g/dL (ref 13.0–17.0)
MCH: 28.8 pg (ref 26.0–34.0)
MCHC: 34.4 g/dL (ref 30.0–36.0)
MCV: 83.6 fL (ref 78.0–100.0)
Platelets: 425 10*3/uL — ABNORMAL HIGH (ref 150–400)
RBC: 4.76 MIL/uL (ref 4.22–5.81)
RDW: 15 % (ref 11.5–15.5)
WBC: 16.4 10*3/uL — ABNORMAL HIGH (ref 4.0–10.5)

## 2013-09-16 MED ORDER — STARCH (THICKENING) PO POWD
ORAL | Status: DC | PRN
  Filled 2013-09-16: qty 227

## 2013-09-16 MED ORDER — ENSURE PUDDING PO PUDG
1.0000 | Freq: Three times a day (TID) | ORAL | Status: DC
  Administered 2013-09-16 – 2013-09-17 (×3): 1 via ORAL

## 2013-09-16 NOTE — Progress Notes (Signed)
Patient is my clinic patient. I attempted to see him and he was sleeping. I appreciate care provided by primary team.

## 2013-09-16 NOTE — Progress Notes (Signed)
INITIAL NUTRITION ASSESSMENT  Pt meets criteria for SEVERE MALNUTRITION in the context of chronic illness as evidenced by a 5.3% weight loss in one month and severe fat and muscle mass depletion.  DOCUMENTATION CODES Per approved criteria  -Severe malnutrition in the context of chronic illness   INTERVENTION: Provide Ensure Pudding po TID, each supplement provides 170 kcal and 4 grams of protein  Provide Magic cup TID with meals, each supplement provides 290 kcal and 9 grams of protein  NUTRITION DIAGNOSIS: Malnutrition related to chronic illness as evidenced by a 5.3% weight loss in one month and severe fat and muscle mass depletion.   Goal: Pt to meet >/= 90% of their estimated nutrition needs   Monitor:  PO intake, supplement acceptance, weight trends, labs, I/O's  Reason for Assessment: MST  79 y.o. male  Admitting Dx: Aspiration pneumonia  ASSESSMENT: Pt presenting with ataxic gait, found to have CXR / labs suggestive of PNA, likely aspiration. Also with incidentally noted transaminitis. PMH significant for severe MR, COPD, HLD, atrial flutter on amiodarone, GERD / esophageal ulcers, hx of GI bleed, hx of aspiration PNA.   Pt was eating breakfast during time of visit. Pt was difficult to understand, but was able to yes/no to questions asked. Pt reports he has a good appetite. Unable to obtain nutrition history from pt. Meal completion is 50-100%. Pt reports he is willing to try oral supplements, Ensure pudding and Magic cup. Will order. Noted pt with a 5.3% weight loss in one month.   Nutrition Focused Physical Exam:  Subcutaneous Fat:  Orbital Region: N/A Upper Arm Region: Severe depletion Thoracic and Lumbar Region: Severe depletion  Muscle:  Temple Region: Severe depletion Clavicle Bone Region: Severe depletion Clavicle and Acromion Bone Region: Severe depletion Scapular Bone Region: N/A Dorsal Hand: Severe depletion Patellar Region: Severe depletion Anterior  Thigh Region: Severe depletion Posterior Calf Region: Severe depletion  Edema: none  Labs: Low sodium. High AST, ALT, and total bilirubin.  Height: Ht Readings from Last 1 Encounters:  30-Sep-2013  (1.727 m)    Weight: Wt Readings from Last 1 Encounters:  2013-09-30 125 lb 3.5 oz (56.8 kg)    Ideal Body Weight: 154 lbs  % Ideal Body Weight: 81%  Wt Readings from Last 10 Encounters:  09/30/13 125 lb 3.5 oz (56.8 kg)  09-30-2013 127 lb (57.607 kg)  08/12/13 132 lb (59.875 kg)  05/28/13 130 lb (58.968 kg)  04/13/13 138 lb 3.7 oz (62.7 kg)  11/26/12 143 lb 8 oz (65.091 kg)  12/12/11 156 lb (70.761 kg)  04/04/11 161 lb (73.029 kg)  01/09/11 161 lb (73.029 kg)  10/26/10 160 lb 8 oz (72.802 kg)    Usual Body Weight: 140 lbs  % Usual Body Weight: 89%  BMI:  Body mass index is 19.04 kg/(m^2).  Estimated Nutritional Needs: Kcal: 1700-1900  Protein: 80-90 gm  Fluid: 1.7-1.9 L  Skin: stage I pressure ulcer on left buttocks, unstageable pressure ulcer on right buttocks  Diet Order: Dysphagia 2 with nectar thick liquids  EDUCATION NEEDS: -No education needs identified at this time   Intake/Output Summary (Last 24 hours) at 09/16/13 0911 Last data filed at 09/16/13 0437  Gross per 24 hour  Intake    360 ml  Output    600 ml  Net   -240 ml    Last BM: 8/27   Labs:   Recent Labs Lab 09-30-13 1305 09/15/13 0601  NA 135* 135*  K 4.1 3.9  CL  98 97  CO2 25 25  BUN 13 13  CREATININE 0.68 0.64  CALCIUM 8.6 8.4  GLUCOSE 120* 72    CBG (last 3)  No results found for this basename: GLUCAP,  in the last 72 hours  Scheduled Meds: . antiseptic oral rinse  7 mL Mouth Rinse BID  . Chlorhexidine Gluconate Cloth  6 each Topical Q0600  . clindamycin  300 mg Oral 3 times per day  . docusate sodium  100 mg Oral BID  . fluticasone  1 puff Inhalation BID  . lurasidone  20 mg Oral Daily  . mupirocin ointment  1 application Nasal BID  . pantoprazole  40 mg Oral BID   . potassium chloride SA  20 mEq Oral q morning - 10a  . sodium chloride  3 mL Intravenous Q12H    Continuous Infusions:   Past Medical History  Diagnosis Date  . COPD (chronic obstructive pulmonary disease)   . History of stomach ulcers   . Retardation   . Anemia   . Hypertension   . GERD (gastroesophageal reflux disease)   . Dementia     Past Surgical History  Procedure Laterality Date  . Gastrectomy      Marijean Niemann, MS, Provisional LDN Pager # [phone] After hours/ weekend pager # [phone]

## 2013-09-16 NOTE — Progress Notes (Signed)
I have seen and examined this patient. I have discussed with Dr Gottschalk.  I agree with their findings and plans as documented in their progress note.     

## 2013-09-16 NOTE — Clinical Social Work Placement (Addendum)
Clinical Social Work Department CLINICAL SOCIAL WORK PLACEMENT NOTE 09/16/2013  Patient:  [name] [name], [name] [name]  Account Number:  [id number] Admit date:  09/18/2013  Clinical Social Worker:  Genelle Bal, LCSW  Date/time:  09/16/2013 04:16 AM  Clinical Social Work is seeking post-discharge placement for this patient at the following level of care:   SKILLED NURSING   (*CSW will update this form in Epic as items are completed)     Patient/family provided with [facility] Department of Clinical Social Work's list of facilities offering this level of care within the geographic area requested by the patient (or if unable, by the patient's family).    Patient/family informed of their freedom to choose among providers that offer the needed level of care, that participate in Medicare, Medicaid or managed care program needed by the patient, have an available bed and are willing to accept the patient.    Patient/family informed of [facility]' ownership interest in [facility], as well as of the fact that they are under no obligation to receive care at this facility.  PASARR submitted to EDS on  PASARR number received on 04/19/2013  FL2 transmitted to all facilities in geographic area requested by pt/family on  09/16/2013 FL2 transmitted to all facilities within larger geographic area on   Patient informed that his/her managed care company has contracts with or will negotiate with  certain facilities, including the following:     Patient/family informed of bed offers received:   Patient chooses bed at  Physician recommends and patient chooses bed at    Patient to be transferred to  on   Patient to be transferred to facility by  Patient and family notified of transfer on  Name of family member notified:    The following physician request were entered in Epic:   Additional Comments:

## 2013-09-16 NOTE — Clinical Social Work Psychosocial (Signed)
Clinical Social Work Department BRIEF PSYCHOSOCIAL ASSESSMENT 09/16/2013  Patient:  [name] [name], [name] [name]     Account Number:  [id number]     Admit date:  26-Sep-2013  Clinical Social Worker:  Delmer Islam  Date/Time:  09/16/2013 04:02 AM  Referred by:  Physician  Date Referred:  09/16/2013 Referred for  SNF Placement   Other Referral:   Interview type:  Other - See comment Other interview type:   Leander Rams (mobile: [phone]), Group Home Manager    PSYCHOSOCIAL DATA Living Status:  FACILITY Admitted from facility:  Other Level of care:  Group Home Primary support name:  Leander Rams Primary support relationship to patient:  NONE Degree of support available:   Mr. Romilda Garret is Group Land at [facility] group home.    CURRENT CONCERNS Current Concerns  Post-Acute Placement   Other Concerns:    SOCIAL WORK ASSESSMENT / PLAN Patient is from [facility] group home, affiliated with [employer]. He is a ward of the state and DSS is his guardian. Adult services SW is Lynnea Ferrier [phone]).  CSW contacted Ms. Funderburk and message left. Call made to Mr. Coltrane and he explained that he is the Group Land and patient has been at this facility since 1999 and they will take patient back from hospital if they can appropriately meet his needs medically or from SNF if this is what patient needs. He explained that patient had been to [facility] in March/April of this year, before returning to their facility. Mr. Romilda Garret explained that they can review clinicals to determine if they can meet his needs. They will be reviewed by Clinical Director, Cindi Carbon.   Assessment/plan status:  Psychosocial Support/Ongoing Assessment of Needs Other assessment/ plan:   Information/referral to community resources:   None needed or requested at this time.    PATIENT'S/FAMILY'S RESPONSE TO PLAN OF CARE: CSW unable to talk with patient. Mr. Romilda Garret with group home  is agreeable to patient returning as long as they can meet his needs medically.

## 2013-09-16 NOTE — Progress Notes (Signed)
Pt took himself off oxygen, RT and Tech noticed his sats were 85 so we placed back on nasal cannula of 2 LPM

## 2013-09-16 NOTE — Progress Notes (Signed)
Family Medicine Teaching Service Daily Progress Note Intern Pager: [phone]  Patient name: [name] [name] Medical record number: [id number] Date of birth: 1934/10/01 Age: 79 y.o. Gender: male  Primary Care Provider: Jacquelin Hawking, MD Consultants: none Code Status: FULL CODE per [name] [name]  Pt Overview and Major Events to Date:  09/20/2013: Question about DNR status.  Assessment and Plan:  [name] [name] is a 79 y.o. male presenting with ataxic gait to the clinic earlier today, found to have CXR / labs suggestive of PNA, likely aspiration. Also with incidentally noted transaminitis. PMH significant for severe [name], COPD, HLD, atrial flutter on amiodarone, GERD / esophageal ulcers, hx of GI bleed, hx of aspiration PNA.   #Aspiration PNA - suggested by WBC elevation and CXR findings, though no fever and no O2 requirement, currently  - Code status/ care discussed with care manager [name] [name] [phone]) and she stated, "I have not received a note saying he is terminal from our team.  He is a full code not a DNR/DNI.  Call me back if his situation worsens, and I will make decisions then" - Palliative care consulted.  Patient to be evaluated today.  PC will contact Ms [name] [name] and have a phone conference regarding patient's long term plan. - Repeat CXR 8/26: Areas of consolidation on the left, superimposed on in a pulmonary fibrosis and emphysema - started vanc / Zosyn empirically with plan to monitor for any change in respiratory status, hypoxia, etc  - ordered for dysphagia 2 diet with nectar thick liquids and requested SLP eval for further recs  - trend CBC - Zofran PRN for any nausea / vomiting  - Awaiting a.m. Labs - PO Clindamycin 300  #Transaminitis - uncertain etiology, but noted on CMP in the ED -likely d/t amiodarone dose at group home -amiodarone level: awaiting results, patient's BP and HR are within normal limits at this time. -amiodarone dc'd for now.  #Ataxia - unclear etiology  with non-acute CT findings  - possibly related to acute illness / lab abnormalities as above  - plan to treat / monitor as above, with low threshold for MRI +/- neurology consult and/or further labs, pending improvement or persistence  - PT / OT eval : recommend return to grp home w HH.  Spoke to [name] [name] (OT) on phone.  She says SNF would also be appropriate if patient is unable to return to Group Home.  #Hx atrial flutter - on amiodarone at home, though at some [name] between now and March dose was increased from 400 mg daily to BID  - awaiting amiodarone levels to result - amiodorone dc'd. - monitoring  #GERD with hx of GI bleed - continue PPI (Protonix per formulary) and avoid anticoagulants  - continue PRN Maalox   #Hx COPD / asthma - not in any current respiratory distress, as above  - continue albuterol PRN  #Hx [name] / anxiety / ?psychotic disorder - continue scheduled Latuda, PRN Ativan   FEN/GI: Dysphagia 2 diet with pending SLP eval for further recs, saline lock IV  Prophylaxis: SCD's, on PPI   Disposition: Discharge planning pending resolution of acute illness.  Subjective:  [name] [name] is unable to communicate any questions/concerns but does reply to some yes/no questions.  He denies abdominal pain.   Objective: Temp:  [98.2 F (36.8 C)-98.4 F (36.9 C)] 98.4 F (36.9 C) (08/27 0435) Pulse Rate:  [76-79] 77 (08/27 0435) Resp:  [18-21] 18 (08/27 0435) BP: (113-126)/(52-82) 126/52 mmHg (08/27 0435) SpO2:  [88 %-95 %]  90 % (08/27 0435) Physical Exam: General: elderly white male, sitting in chair at bedside, NAD, smiles at me upon entering room, phlebotomist at bedside Cardiovascular: difficult to assess over coarse breath sounds but seemingly RRR Respiratory: globally coarse breath sounds, especially on expiration, normal work of breathing, good saturation (90%) on room air. Abdomen: soft, NT/ND, +BS x4 Extremities: WWP, thin Neuro: does not communicate clearly (can make  out some words), follow commands, moves extremities spontaneously, gait not assessed.  Laboratory:  Recent Labs Lab 05-Oct-2013 1305 09/15/13 0601  WBC 14.4* 14.8*  HGB 13.8 14.5  HCT 39.7 40.4  PLT 381 414*    Recent Labs Lab 2013-10-05 1305 09/15/13 0601  NA 135* 135*  K 4.1 3.9  CL 98 97  CO2 25 25  BUN 13 13  CREATININE 0.68 0.64  CALCIUM 8.6 8.4  PROT 7.0 6.9  BILITOT 0.9 1.3*  ALKPHOS 105 105  ALT 369* 348*  AST 162* 164*  GLUCOSE 120* 72      Imaging/Diagnostic Tests: Dg Chest 1 View  09/15/2013   CLINICAL DATA:  Pneumonia; cough and congestion  EXAM: CHEST - 1 VIEW  COMPARISON:  05-Oct-2013  FINDINGS: Patchy airspace consolidation is noted throughout the left mid and lower lung zones without appreciable change. There is underlying emphysema with areas of chronic fibrosis, stable. There is patchy atelectatic change in the right lower lobe. No new opacity is seen. Heart is mildly enlarged with pulmonary vascularity within normal limits. No adenopathy. There is evidence of old rib trauma on the right. Evidence of hiatal hernia, stable.  IMPRESSION: Areas of consolidation on the left, superimposed on in a pulmonary fibrosis and emphysema. No new opacities are identified compared to 1 day prior. Aspiration pneumonia must be a differential consideration given the changes in the left lower lung region.   Electronically Signed   By: Bretta Bang M.D.   On: 09/15/2013 08:08   Dg Chest 2 View  10/05/13   CLINICAL DATA:  COPD exacerbation.  EXAM: CHEST  2 VIEW  COMPARISON:  Portable chest x-rays 04/10/2013 dating back to 04/07/2013. Two-view chest x-ray 12/12/2011.  FINDINGS: Cardiac silhouette mildly enlarged, increased in size since 2013. Thoracic aorta mildly atherosclerotic, unchanged. Small hiatal hernia, unchanged. Hilar and mediastinal contours otherwise unremarkable. Interval improved aeration in both lungs since the March, 2015 examinations. Focal airspace  consolidation in the left lower lobe. Diffuse interstitial lung disease has progressed significantly since 2013. Scattered areas of focal scarring in both lungs, also progressive.  IMPRESSION: 1. Diffuse interstitial lung disease with scattered areas of scarring throughout both lungs, progressive since 2013. 2. Improved aeration in both lungs since the examinations from March, 2013 when the patient had multifocal pneumonia. Persistent or recurrent pneumonia in the left lower lobe is suspected. 3. Mild cardiomegaly with interval increase in heart size since 2013. No evidence of pulmonary edema. 4. Stable small hiatal hernia.   Electronically Signed   By: Hulan Saas M.D.   On: Oct 05, 2013 15:18   Ct Head (brain) Wo Contrast  10/05/13   CLINICAL DATA:  Unsteady gait, fall  EXAM: CT HEAD WITHOUT CONTRAST  TECHNIQUE: Contiguous axial images were obtained from the base of the skull through the vertex without intravenous contrast.  COMPARISON:  04/07/2013  FINDINGS: Motion degraded images.  No evidence of parenchymal hemorrhage or extra-axial fluid collection. No mass lesion, mass effect, or midline shift.  No CT evidence of acute infarction.  Subcortical white matter and periventricular small vessel  ischemic changes. Intracranial atherosclerosis  Mild cortical atrophy.  No ventriculomegaly.  The visualized paranasal sinuses are essentially clear. The mastoid air cells are unopacified.  No evidence of calvarial fracture.  IMPRESSION: Motion degraded images.  No evidence of acute intracranial abnormality.  Atrophy with small vessel ischemic changes and intracranial atherosclerosis.   Electronically Signed   By: Charline Bills M.D.   On: 09-22-13 14:00    Raliegh Ip, DO 09/16/2013, 6:49 AM PGY-1, [place] [facility] FPTS Intern pager: [phone], text pages welcome

## 2013-09-16 NOTE — Progress Notes (Signed)
Note and chart reviewed. Agree with assessment and intervention. Le Faulcon Barnett RD, LDN Inpatient Clinical Dietitian Pager: [phone] After Hours Pager: [phone]  

## 2013-09-16 NOTE — Progress Notes (Signed)
FPTS PGY-3 Interim Note  Late entry of note; spoke with Dr. Phillips Odor of palliative care around 11:45. She expressed concerns about changes in pt's care plan between now and this past March, as pt had firm GOC established at that point for comfort care, no rehospitalizatoin, etc; she is in the process of discussing pt's care and goals with his state-appointed guardian and will follow up with [place] for further recs. Expect there will need to be further clarification of GOC and dispo will also have to be clarified (SNF or group home, etc). Continue current care, for now, per Dr. Delynn Flavin progress note today and see also prior notes re: discussions with pt's guardian. Greatly appreciate palliative care assistance with this patient.  Bobbye Morton, MD PGY-3, [facility] 09/16/2013, 3:12 PM FPTS Service pager: [phone] (text pages welcome through [facility])

## 2013-09-16 NOTE — Progress Notes (Signed)
Occupational Therapy Treatment Patient Details Name: [name] [name] MRN: [id number] DOB: 03-Feb-1934 Today's Date: 09/16/2013    History of present illness Pt admitted with suspected aspiration PNA and more unsteady gait than baseline.  PMHx:  severe MR, ataxia, aflutter, GERD, GIB, and aspiration PNA.   OT comments  Pt up in chair, slumped down and sleeping.  Assisted pt back to bed and positioned on L side. Spoke to MD this a.m. Plan is for d/c to SNF.  Will defer any further OT.  Follow Up Recommendations  SNF;Supervision/Assistance - 24 hour    Equipment Recommendations  None recommended by OT    Recommendations for Other Services      Precautions / Restrictions Precautions Precautions: Fall       Mobility Bed Mobility Overal bed mobility: Needs Assistance Bed Mobility: Sit to Supine       Sit to supine: Max assist   General bed mobility comments: placed pillow so pt on his L cane  Transfers Overall transfer level: Needs assistance   Transfers: Stand Pivot Transfers   Stand pivot transfers: +2 physical assistance;Mod assist            Balance     Sitting balance-Leahy Scale: Poor                             ADL                                         General ADL Comments: Performed mouth care with total assist.      Vision                     Perception     Praxis      Cognition     Overall Cognitive Status: History of cognitive impairments - at baseline                       Extremity/Trunk Assessment               Exercises     Shoulder Instructions       General Comments      Pertinent Vitals/ Pain       Pain Assessment: Faces Pain Score: 2  Faces Pain Scale: Hurts a little bit Pain Location: unclear, but pt grimaces with repositioning Pain Intervention(s): Repositioned;Monitored during session  Home Living                                           Prior Functioning/Environment              Frequency       Progress Toward Goals  OT Goals(current goals can now be found in the care plan section)  Progress towards OT goals: Not progressing toward goals - comment;Goals met/education completed, patient discharged from OT (palliative care consulted)  Acute Rehab OT Goals Patient Stated Goal: unable to state  Plan Discharge plan needs to be updated (Per MD communication today, pt is to go to SNF.)    Co-evaluation                 End of Session     Activity Tolerance Patient limited by fatigue  Patient Left in bed;with call bell/phone within reach;with bed alarm set   Nurse Communication          Time: 9971-8209 OT Time Calculation (min): 28 min  Charges: OT General Charges $OT Visit: 1 Procedure OT Treatments $Therapeutic Activity: 8-22 mins  Malka So 09/16/2013, 2:35 PM [phone]

## 2013-09-17 DIAGNOSIS — R279 Unspecified lack of coordination: Secondary | ICD-10-CM

## 2013-09-17 DIAGNOSIS — Z515 Encounter for palliative care: Secondary | ICD-10-CM

## 2013-09-17 LAB — COMPREHENSIVE METABOLIC PANEL
ALT: 256 U/L — AB (ref 0–53)
AST: 124 U/L — AB (ref 0–37)
Albumin: 2.2 g/dL — ABNORMAL LOW (ref 3.5–5.2)
Alkaline Phosphatase: 108 U/L (ref 39–117)
Anion gap: 12 (ref 5–15)
BUN: 24 mg/dL — ABNORMAL HIGH (ref 6–23)
CO2: 26 meq/L (ref 19–32)
Calcium: 8.1 mg/dL — ABNORMAL LOW (ref 8.4–10.5)
Chloride: 101 mEq/L (ref 96–112)
Creatinine, Ser: 0.77 mg/dL (ref 0.50–1.35)
GFR calc Af Amer: >90 mL/min (ref >90)
GFR calc non Af Amer: 86 mL/min — ABNORMAL LOW (ref >90)
Glucose, Bld: 116 mg/dL — ABNORMAL HIGH (ref 70–99)
POTASSIUM: 4.4 meq/L (ref 3.7–5.3)
Sodium: 139 mEq/L (ref 137–147)
TOTAL PROTEIN: 6.4 g/dL (ref 6.0–8.3)
Total Bilirubin: 0.9 mg/dL (ref 0.3–1.2)

## 2013-09-17 LAB — GLUCOSE, CAPILLARY: Glucose-Capillary: 127 mg/dL — ABNORMAL HIGH (ref 70–99)

## 2013-09-17 MED ORDER — ONDANSETRON HCL 4 MG/2ML IJ SOLN: 4.0000 mg | Freq: Four times a day (QID) | INTRAMUSCULAR | Status: DC

## 2013-09-17 MED ORDER — PHYTONADIONE 5 MG PO TABS
5.0000 mg | ORAL_TABLET | Freq: Every day | ORAL | Status: DC
  Administered 2013-09-17: 5 mg via ORAL
  Filled 2013-09-17: qty 1

## 2013-09-17 MED ORDER — DOCUSATE SODIUM 50 MG/5ML PO LIQD
100.0000 mg | Freq: Two times a day (BID) | ORAL | Status: DC
  Administered 2013-09-17: 100 mg via ORAL
  Filled 2013-09-17 (×2): qty 10

## 2013-09-21 LAB — AMIODARONE LEVEL
AMIODARONE LVL: 2 ug/mL (ref 1.5–2.5)
N-DESETHYL-AMIODARONE: 1.9 ug/mL (ref 1.5–2.5)

## 2013-09-21 NOTE — Clinical Social Work Note (Signed)
Patient expired today. DSS Guardian - SW Supervisor Lynnea Ferrier contacted and message left. Nurse case manager Elnita Maxwell Royal contacted Group Home Case Manager Leander Rams to inform him.  Genelle Bal, MSW, LCSW [phone]

## 2013-09-21 NOTE — Progress Notes (Signed)
Nurse Tech asked me to check on pt.  Pt. Found in chair with no respiration and no heartbeat.  DNR status verified.  Lack of heart and lung sounds verified by the patient's nurse, Dionisio David, RN  Peri Maris, MBA, BS, RN

## 2013-09-21 NOTE — Progress Notes (Signed)
Got call from Nurse Tech and by another nurse while this writer was with another patient with the CN observing the PD.  This writer went into this patient room and checked his pulse radial and apical- no pulse, checked the respirations - no respirations.  Patient is DNR checked and verified.  This Clinical research associate and another RN pronounced him dead at 4:32 pm.  Paged the Teaching Service and notified them.

## 2013-09-21 NOTE — Progress Notes (Signed)
Message left again with another contact at [facility], who did shortly return my call. Ms. Dion Saucier informed me that [name] [name] is [name] [name] legal guardian. Ms. Dion Saucier did not know [name] [name] was hospitalized. Dr. Leonidas Romberg spoke with [name] [name] briefly on admission at which time his code status was changed to FULL CODE. Ms. Dion Saucier informed me that it is their DSS policy to not make someone a DNR unless they are at end of life and after discussing this designation with available family. I explained why this does not serve patient's well since [name] [name] has had an acute decline and has aspirated again. He is unstable, now hypotensive, fever is 101, sat is 89%. Ms. Dion Saucier does not have the authority to change his code status -she has contacted his legal guardian and requested she return my call as soon as possible. I was instructed to leave his code status as FULL CODE. Awaiting return call.  Anderson Malta, DO Palliative Medicine

## 2013-09-21 NOTE — Progress Notes (Signed)
Patient vomited x 1.  Raised the bed 90 degrees and suctioned the patient, could hear crackling sounds.  Teaching service paged, no order given except to monitor the patient.

## 2013-09-21 NOTE — Progress Notes (Signed)
FPTS PGY-3 Interim Note  Paged by RN. Pt sats ~89-90% on 2L [place]. Not in any increased distress from previously, but also now febrile to 101.3. Will give a dose of Tylenol this morning and confer with day team. Will obtain a set of blood cultures and may need re-escalate abx to IV. May also get repeat CXR, as well. See pending daily progress note for full assessment / plan.  Bobbye Morton, MD PGY-3, [facility] 10-03-2013, 7:00 AM FPTS Service pager: [phone] (text pages welcome through [facility])

## 2013-09-21 NOTE — Discharge Summary (Signed)
I had seen and examined this patient. I had discussed with Dr Nadine Counts.  I agree with their findings and plans as documented in their Death Summary.   Patient died peaceably from Aspiration Pneumonia which was a consequence of his Advanced Dementia.

## 2013-09-21 NOTE — Progress Notes (Addendum)
Palliative Medicine Team Consult Note Requested by: FPTS  Mr. [name] [name] is well known to the Palliative Service. He was seen in March 2015 at which time goals of care were established to be in line with comfort, dignity and to allow for a natural death to occur given his frail and extremely debilitated condition. He has chronic aspiration for which there is no cure, recurrent PNA and sepsis. This morning he is febrile and hypotensive. He is mostly unresponsive and non-verbal with periods of agitation.  My strong recommendation is to transition Mr. [name] [name] to full comfort care as the most medically reasonable and compassionate choice given his acute decline and chronic debility. He would be appropriate at this time for a hospice facility where our focus can be on providing comfort, symptom management and dignity as he approaches end of life.  I have placed multiple phone calls and left messages with DSS Guardian contacts. Awaiting return call. He should remain a DNR as previously determined in March, but based on H&P and resident conversation with DSS guardian Rhea Bleacher- her goals for him may have changed since he is a FULL CODE now. Would not advise CPR or intubation should he have an arrest which is an entirely separate issue than medical decision making and treatment options.  Recommendations:  1. Code Status should be changed to DNR as previously determined (will await consent from DSS Guardian) 2. Patient should be transitioned to full comfort care including a focus on pain and dyspnea management, comfort feeding and dignity. 3. Given his acute decline would be appropriate for a hospice facility.  Awaiting consent from Guardian. Also attempted to call on call DSS social worker but was placed on hold for >30 minutes at which time I disconnected the call.   Anderson Malta, DO Palliative Medicine

## 2013-09-21 NOTE — Progress Notes (Signed)
Family Medicine Teaching Service Daily Progress Note Intern Pager: [phone]  Patient name: [name] [name] Medical record number: [id number] Date of birth: 23-Oct-1934 Age: 79 y.o. Gender: male  Primary Care Provider: Jacquelin Hawking, MD Consultants: none Code Status: FULL CODE per Lynnell Dike  Pt Overview and Major Events to Date:  09/16/2013: Question about DNR status.  Assessment and Plan:  [name] [name] is a 79 y.o. male presenting with ataxic gait to the clinic earlier today, found to have CXR / labs suggestive of PNA, likely aspiration. Also with incidentally noted transaminitis. PMH significant for severe MR, COPD, HLD, atrial flutter on amiodarone, GERD / esophageal ulcers, hx of GI bleed, hx of aspiration PNA.   #Aspiration PNA - suggested by WBC elevation and CXR findings, though no fever and no O2 requirement initially, now with repeated aspiration and new O2 requirement. Minimal improvement.  - Code status/ care discussed with care manager Lynnell Dike [phone]) and she stated, "I have not received a note saying he is terminal from our team.  He is a full code not a DNR/DNI.  Call me back if his situation worsens, and I will make decisions then" - Palliative care consulted.  Patient to be evaluated today.  PC will contact Ms [name] [name] and have a phone conference regarding patient's long term plan. - Repeat CXR 8/26: Areas of consolidation on the left, superimposed on in a pulmonary fibrosis and emphysema - started vanc / Zosyn empirically and transitioned to Clindamycin PO.  - Pt. With new fever 101.3, and O2 requirement 2LNC with sats at 89% this am.  - Blood cultures drawn - ordered for dysphagia 2 diet with nectar thick liquids and requested SLP eval for further recs  - trending CBC - Zofran PRN for any nausea / vomiting  - Awaiting a.m. Labs - At this point we are continuing to attempt to clarify his status in terms of Resuscitation and Comfort care. After speaking with  palliative care this am, this patient has been DNR, and Comfort care before due to his chronic aspiration pneumonias and very poor prognosis. Our team initially contacted the guardian who stated that the patient is a full code, and all measures should be taken. Palliative care as well as our team have made repeated attempts to contact the patient's guardian again however, at this point our efforts have been unsuccessful. Per the most recent instructions of the guardian (based on our conversation with her), the patient is to be a FULL Code and all efforts for treatment are to be made. At this point, we have contacted the Ethics committee for their input, and per their instructions we will have to proceed with the wishes of the guardian until she is able to be contacted and a discussion about changing his status can be had. From a medical standpoint his condition is worsening as he has repeated aspiration pneumonias that will continue to recur. This condition has been shown to be an end stage result of the inability to protect the airway, and measures to avoid recurrent aspiration have been medically shown to be generally futile. He is nearing the end of his life as he is continuing to deteriorate at this point. We are continuing antibiotics on him to treat his pneumonia per the guardian's wishes, however this treatment may periodically decrease the bacterial load on his system due to his aspiration pneumonia, however the chance that a repeat aspiration will reinitiate his infection is HIGHLY likely, and is the natural course of his disease  state. He is appropriate at this time for DNR, and Comfort care based on our medical evaluation of him. However, we will need to have an in depth discussion with his guardian in order to determine whether they feel that deescalating care is appropriate at this point. Will continue to attempt to contact guardian.   #Transaminitis - uncertain etiology, but noted on CMP in the  ED -likely d/t amiodarone dose at group home -amiodarone level: awaiting results, patient's BP and HR are within normal limits at this time. -amiodarone dc'd for now.  #Ataxia - unclear etiology with non-acute CT findings  - possibly related to acute illness / lab abnormalities as above  - plan to treat / monitor as above, with low threshold for MRI +/- neurology consult and/or further labs, pending improvement or persistence  - PT / OT eval : recommend return to grp home w HH.  Spoke to [place] (OT) on phone.  She says SNF would also be appropriate if patient is unable to return to Group Home.  #Hx atrial flutter - on amiodarone at home, though at some point between now and March dose was increased from 400 mg daily to BID.  - Monitoring HR.   - awaiting amiodarone levels to result - amiodorone dc'd.  #GERD with hx of GI bleed - continue PPI (Protonix per formulary) and avoid anticoagulants  - continue PRN Maalox   #Hx COPD / asthma - not in any current respiratory distress, as above  - continue albuterol PRN - O2 requirement likely a result of aspiration pneumonia at this time. Will continue to monitor.   #Hx MR / anxiety / ?psychotic disorder - continue scheduled Latuda, PRN Ativan   FEN/GI: Dysphagia 2 diet with pending SLP eval for further recs, saline lock IV  Prophylaxis: SCD's, on PPI   Disposition: Discharge planning pending resolution of acute illness.  Subjective:  [name] is unable to communicate very well upon my interview this am. At this point he will make eye contact upon asking questions, but is not nodding, or saying "yes" or "no" to questioning.    Objective: Temp:  [98 F (36.7 C)-101.3 F (38.5 C)] 98.1 F (36.7 C) (08/28 0855) Pulse Rate:  [76-87] 76 (08/28 0855) Resp:  [18-20] 18 (08/28 0855) BP: (108-140)/(52-88) 108/52 mmHg (08/28 0855) SpO2:  [89 %-96 %] 93 % (08/28 0855) Physical Exam: General: elderly white male, sitting in chair at bedside,  NAD Cardiovascular: RRR, No MGR Respiratory: globally coarse breath sounds, especially on expiration, Diffuse crackles on inspiration, normal work of breathing, saturation 91% on room air upon my evaluation this am. Abdomen: soft, NT/ND, +BS, no organomegaly noted Extremities: WWP, thin Neuro: does not communicate clearly (can make out some words), not following commands this am, moves extremities spontaneously, gait not assessed.  Laboratory:  Recent Labs Lab 09-21-2013 1305 09/15/13 0601 09/16/13 0915  WBC 14.4* 14.8* 16.4*  HGB 13.8 14.5 13.7  HCT 39.7 40.4 39.8  PLT 381 414* 425*    Recent Labs Lab 09/21/2013 1305 09/15/13 0601 09/16/13 0915  NA 135* 135* 135*  K 4.1 3.9 4.9  CL 98 97 98  CO2 BUN CREATININE 0.68 0.64 0.74  CALCIUM 8.6 8.4 8.5  PROT 7.0 6.9  --   BILITOT 0.9 1.3*  --   ALKPHOS 105 105  --   ALT 369* 348*  --   AST 162* 164*  --   GLUCOSE 120* 72 123*  Imaging/Diagnostic Tests: Dg Chest 1 View  09/15/2013   CLINICAL DATA:  Pneumonia; cough and congestion  EXAM: CHEST - 1 VIEW  COMPARISON:  September 14, 2013  FINDINGS: Patchy airspace consolidation is noted throughout the left mid and lower lung zones without appreciable change. There is underlying emphysema with areas of chronic fibrosis, stable. There is patchy atelectatic change in the right lower lobe. No new opacity is seen. Heart is mildly enlarged with pulmonary vascularity within normal limits. No adenopathy. There is evidence of old rib trauma on the right. Evidence of hiatal hernia, stable.  IMPRESSION: Areas of consolidation on the left, superimposed on in a pulmonary fibrosis and emphysema. No new opacities are identified compared to 1 day prior. Aspiration pneumonia must be a differential consideration given the changes in the left lower lung region.   Electronically Signed   By: Bretta Bang M.D.   On: 09/15/2013 08:08   Dg Chest 2 View  08/29/2013   CLINICAL DATA:   COPD exacerbation.  EXAM: CHEST  2 VIEW  COMPARISON:  Portable chest x-rays 04/10/2013 dating back to 04/07/2013. Two-view chest x-ray 12/12/2011.  FINDINGS: Cardiac silhouette mildly enlarged, increased in size since 2013. Thoracic aorta mildly atherosclerotic, unchanged. Small hiatal hernia, unchanged. Hilar and mediastinal contours otherwise unremarkable. Interval improved aeration in both lungs since the March, 2015 examinations. Focal airspace consolidation in the left lower lobe. Diffuse interstitial lung disease has progressed significantly since 2013. Scattered areas of focal scarring in both lungs, also progressive.  IMPRESSION: 1. Diffuse interstitial lung disease with scattered areas of scarring throughout both lungs, progressive since 2013. 2. Improved aeration in both lungs since the examinations from March, 2013 when the patient had multifocal pneumonia. Persistent or recurrent pneumonia in the left lower lobe is suspected. 3. Mild cardiomegaly with interval increase in heart size since 2013. No evidence of pulmonary edema. 4. Stable small hiatal hernia.   Electronically Signed   By: Hulan Saas M.D.   On: 08/31/2013 15:18   Ct Head (brain) Wo Contrast  09/05/2013   CLINICAL DATA:  Unsteady gait, fall  EXAM: CT HEAD WITHOUT CONTRAST  TECHNIQUE: Contiguous axial images were obtained from the base of the skull through the vertex without intravenous contrast.  COMPARISON:  04/07/2013  FINDINGS: Motion degraded images.  No evidence of parenchymal hemorrhage or extra-axial fluid collection. No mass lesion, mass effect, or midline shift.  No CT evidence of acute infarction.  Subcortical white matter and periventricular small vessel ischemic changes. Intracranial atherosclerosis  Mild cortical atrophy.  No ventriculomegaly.  The visualized paranasal sinuses are essentially clear. The mastoid air cells are unopacified.  No evidence of calvarial fracture.  IMPRESSION: Motion degraded images.  No  evidence of acute intracranial abnormality.  Atrophy with small vessel ischemic changes and intracranial atherosclerosis.   Electronically Signed   By: Charline Bills M.D.   On: 09/19/2013 14:00    Yolande Jolly, MD 27-Sep-2013, 1:43 PM PGY-1, [facility] FPTS Intern pager: [phone], text pages welcome

## 2013-09-21 NOTE — Discharge Summary (Addendum)
Family Medicine Teaching [facility] Death Summary  Patient name: [name] [name] Medical record number: [id number] Date of birth: 07/28/1934 Age: 79 y.o. Gender: male Date of Admission: 09-23-2013  Date of Death: September 26, 2013 Admitting Physician: Leighton Roach McDiarmid, MD  Primary Care Provider: Jacquelin Hawking, MD Consultants: Palliative, Ethics, WOC  Indication for Hospitalization: Ataxic gait and aspiration pneumonia  Problem List:  Patient Active Problem List   Diagnosis Date Noted  . Acute-on-chronic respiratory failure 10/06/2013  . Severe sepsis 10/06/2013  . Protein-calorie malnutrition, severe 09/16/2013  . Pressure ulcer stage II 09/16/2013  . Ataxia Sep 23, 2013  . Aspiration pneumonia 2013/09/23  . Transaminitis 2013-09-23  . vomiting 08/15/2013  . Other dysphagia 06/18/2013  . Dementia with behavioral disturbance 04/20/2013  . Atrial flutter 04/11/2013  . Malnutrition of moderate degree 04/08/2013  . Well adult exam 11/27/2012  . Allergic rhinitis 04/04/2010  . ECZEMA 12/01/2009  . TOBACCO USE, QUIT 07/07/2009  . CHOLELITHIASIS 04/06/2008  . ESOPHAGEAL ULCER, WITH BLEEDING 08/17/2007  . BARRETTS ESOPHAGUS 08/17/2007  . GI BLEEDING 08/05/2007  . COPD 04/01/2007  . HYPERLIPIDEMIA 05/05/2006  . ANEMIA, IRON DEFICIENCY NOS 05/05/2006  . PSYCHOSIS, UNSPECIFIED 03/20/2006  . MENTAL RETARDATION 03/20/2006  . ASTHMA, INTERMITTENT 03/20/2006  . GASTROESOPHAGEAL REFLUX, NO ESOPHAGITIS 03/20/2006  . HERNIA, HIATAL, NONCONGENITAL 03/20/2006    Discharge Condition: Deceased  Brief Hospital Course:  Mr [name] [name] is a 79 year old male that was admitted with ataxia and a suspected aspiration pneumonia.  PMH of a previous aspiration pneumonia in March of this year, severe mental disability, COPD, HLD, a-flutter, GERD/esophageal ulcers, h/o GI bleed.  On admission, a CXR was obtained with findings suggestive of aspiration pneumonia.  Patient also exhibited an elevated white  count but did not have a fever.  He was started on IV antibiotics and ordered a dysphagia diet with close monitoring.  SLP evaluation was also requested.  PT/OT was also consulted and evaluated patient.  As patient was a ward of the state, DSS guardian Rhea Bleacher was contacted with regards to patient's resuscitation/ treatment status, as prior records from March 2015 stated that patient was not to be re-hospitalized, treated with antibiotics and was comfort care only. On 09/15/13, she was reached and reported that Mr [name] [name] was indeed a Full code.  Please see progress note for full details.  Mr [name] [name] continued to be treated with antibiotics and was transitioned to oral antibiotics after being evaluated by Speech therapy.  CBC was trended.  Palliative care was consulted for treatment planning.  The Palliative care provider attempted to reach Ms Earlene Plater with regards to the changes in treatment plan, but was unsuccessful reaching her or and on call guardian on multiple occasions.  Please see Palliative care notes from 08/27 and 09/27/22 for further details.  Ultimately, the patient was declared a DNR/DNI and patient passed peacefully at 4:32pm on 26-Sep-2013.  Patient's death was verified by Dr Doroteo Glassman who reported that patient was cool, stiff, and pulseless. Death certificate was completed.  Significant Procedures: None  Significant Labs and Imaging:   Recent Labs Lab 09-23-13 1305 09/15/13 0601 09/16/13 0915  WBC 14.4* 14.8* 16.4*  HGB 13.8 14.5 13.7  HCT 39.7 40.4 39.8  PLT 381 414* 425*    Recent Labs Lab 2013/09/23 1305 09/15/13 0601 09/16/13 0915 2013-09-26 1240  NA 135* 135* 135* 139  K 4.1 3.9 4.9 4.4  CL 98 97 98 101  CO2 GLUCOSE 120* 72 123* 116*  BUN  24*  CREATININE 0.68 0.64 0.74 0.77  CALCIUM 8.6 8.4 8.5 8.1*  ALKPHOS 105 105  --  108  AST 162* 164*  --  124*  ALT 369* 348*  --  256*  ALBUMIN 2.6* 2.6*  --  2.2*    Imaging: Dg Chest 1 View 09/15/2013    CLINICAL DATA:  Pneumonia; cough and congestion  EXAM: CHEST - 1 VIEW  COMPARISON:  2013-09-25  FINDINGS: Patchy airspace consolidation is noted throughout the left mid and lower lung zones without appreciable change. There is underlying emphysema with areas of chronic fibrosis, stable. There is patchy atelectatic change in the right lower lobe. No new opacity is seen. Heart is mildly enlarged with pulmonary vascularity within normal limits. No adenopathy. There is evidence of old rib trauma on the right. Evidence of hiatal hernia, stable.  IMPRESSION: Areas of consolidation on the left, superimposed on in a pulmonary fibrosis and emphysema. No new opacities are identified compared to 1 day prior. Aspiration pneumonia must be a differential consideration given the changes in the left lower lung region.   Electronically Signed   By: Bretta Bang M.D.   On: 09/15/2013 08:08   Dg Chest 2 View 2013-09-25   CLINICAL DATA:  COPD exacerbation.  EXAM: CHEST  2 VIEW  COMPARISON:  Portable chest x-rays 04/10/2013 dating back to 04/07/2013. Two-view chest x-ray 12/12/2011.  FINDINGS: Cardiac silhouette mildly enlarged, increased in size since 2013. Thoracic aorta mildly atherosclerotic, unchanged. Small hiatal hernia, unchanged. Hilar and mediastinal contours otherwise unremarkable. Interval improved aeration in both lungs since the March, 2015 examinations. Focal airspace consolidation in the left lower lobe. Diffuse interstitial lung disease has progressed significantly since 2013. Scattered areas of focal scarring in both lungs, also progressive.  IMPRESSION: 1. Diffuse interstitial lung disease with scattered areas of scarring throughout both lungs, progressive since 2013. 2. Improved aeration in both lungs since the examinations from March, 2013 when the patient had multifocal pneumonia. Persistent or recurrent pneumonia in the left lower lobe is suspected. 3. Mild cardiomegaly with interval increase in heart  size since 2013. No evidence of pulmonary edema. 4. Stable small hiatal hernia.   Electronically Signed   By: Hulan Saas M.D.   On: September 25, 2013 15:18   Ct Head (brain) Wo Contrast 2013/09/25   CLINICAL DATA:  Unsteady gait, fall  EXAM: CT HEAD WITHOUT CONTRAST  TECHNIQUE: Contiguous axial images were obtained from the base of the skull through the vertex without intravenous contrast.  COMPARISON:  04/07/2013  FINDINGS: Motion degraded images.  No evidence of parenchymal hemorrhage or extra-axial fluid collection. No mass lesion, mass effect, or midline shift.  No CT evidence of acute infarction.  Subcortical white matter and periventricular small vessel ischemic changes. Intracranial atherosclerosis  Mild cortical atrophy.  No ventriculomegaly.  The visualized paranasal sinuses are essentially clear. The mastoid air cells are unopacified.  No evidence of calvarial fracture.  IMPRESSION: Motion degraded images.  No evidence of acute intracranial abnormality.  Atrophy with small vessel ischemic changes and intracranial atherosclerosis.   Electronically Signed   By: Charline Bills M.D.   On: 09/25/2013 14:00   Scheduled Meds while in Hospit[facility]: . antiseptic oral rinse  7 mL Mouth Rinse BID  . Chlorhexidine Gluconate Cloth  6 each Topical Q0600  . clindamycin  300 mg Oral 3 times per day  . docusate  100 mg Oral BID  . feeding supplement (ENSURE)  1 Container Oral TID BM  .  fluticasone  1 puff Inhalation BID  . lurasidone  20 mg Oral Daily  . mupirocin ointment  1 application Nasal BID  . ondansetron (ZOFRAN) IV  4 mg Intravenous 4 times per day  . pantoprazole  40 mg Oral BID  . phytonadione  5 mg Oral Daily  . potassium chloride SA  20 mEq Oral q morning - 10a  . sodium chloride  3 mL Intravenous Q12H   PRN Meds:.sodium chloride, acetaminophen, albuterol, alum & mag hydroxide-simeth, food thickener, guaifenesin, hydrALAZINE, LORazepam, ondansetron (ZOFRAN) IV, ondansetron, sodium  chloride    Raliegh Ip, DO 09/13/2013, 4:49 PM PGY-1, [facility]

## 2013-09-21 NOTE — Progress Notes (Signed)
Patient with fever of 101.3,tylenol given as ordered.Dr. Casper Harrison notified,order received.Will continue to monitor. Stasha Naraine, Drinda Butts, Charity fundraiser

## 2013-09-21 NOTE — Progress Notes (Signed)
Physical Therapy Treatment Patient Details Name: [name] [name] MRN: [id number] DOB: [birth year] Today's Date: 2013-09-28    History of Present Illness Pt admitted with suspected aspiration PNA and more unsteady gait than baseline.  PMHx:  severe MR, ataxia, aflutter, GERD, GIB, and aspiration PNA.    PT Comments    Patient with episode of aspiration during lunch requiring deep suction. Per RN, pt just recently calmed down prior to PT treatment. Pt tolerated taking a few steps to chair today however refused any further treatment. Not following commands, minimally verbal if at all. Impulsive during mobility. High falls risk. Pt not progressing towards goals today most likely due to fatigue from aspiration episode prior to therapy. Discharge recommendation updated due to increased level of care required and lack of mobility performed today. Will continue to follow and progress as allowed.   Follow Up Recommendations  SNF;Supervision/Assistance - 24 hour     Equipment Recommendations  None recommended by PT    Recommendations for Other Services       Precautions / Restrictions Precautions Precautions: Fall Precaution Comments: premorbid tremor in RUE, tends to lean anteriorly during gait. Aspiration precautions.  Restrictions Weight Bearing Restrictions: No    Mobility  Bed Mobility Overal bed mobility: Needs Assistance Bed Mobility: Supine to Sit     Supine to sit: Mod assist;HOB elevated     General bed mobility comments: Required assist initiating movement BLEs and assist with elevation of trunk.   Transfers Overall transfer level: Needs assistance Equipment used: Rolling walker (2 wheeled) Transfers: Sit to/from Stand Sit to Stand: Min assist;+2 safety/equipment         General transfer comment: Impulsivity noted upon standing. Min A for balance.  Ambulation/Gait Ambulation/Gait assistance: Min assist Ambulation Distance (Feet): 4 Feet Assistive device: Rolling  walker (2 wheeled) Gait Pattern/deviations: Step-through pattern;Trunk flexed;Shuffle Gait velocity: Increased   General Gait Details: Pt ambulated a few steps to chair- note increasing anterior lean with increased speed. Uncontrolled descent into chair. "cant walk." Min A for safety and balance due to impulsiveness and inconsistently following commands.   Stairs            Wheelchair Mobility    Modified Rankin (Stroke Patients Only)       Balance Overall balance assessment: Needs assistance   Sitting balance-Leahy Scale: Poor Sitting balance - Comments: Requires Min A sitting unsupported with right lateral trunk lean. Able to sit unsupport for ~1 minute without LOB. Postural control: Right lateral lean Standing balance support: During functional activity Standing balance-Leahy Scale: Poor Standing balance comment: Requires BUE support with use of RW for safety and balance.                     Cognition Arousal/Alertness: Awake/alert Behavior During Therapy: Impulsive Overall Cognitive Status: History of cognitive impairments - at baseline                      Exercises      General Comments        Pertinent Vitals/Pain Pain Assessment: No/denies pain (shakes head "no" to pain.)    Home Living                      Prior Function            PT Goals (current goals can now be found in the care plan section) Progress towards PT goals: Not progressing toward goals - comment  Frequency  Min 3X/week    PT Plan Discharge plan needs to be updated    Co-evaluation             End of Session Equipment Utilized During Treatment: Gait belt Activity Tolerance: Patient limited by fatigue;Treatment limited secondary to medical complications (Comment) (Pt aspirated during lunch requiring deep suction. Pt fatigued and exhausted from this episode per RN.) Patient left: in chair;with call bell/phone within reach;with chair alarm set      Time: 1442-1500 PT Time Calculation (min): 18 min  Charges:  $Therapeutic Activity: 8-22 mins                    G Codes:      Georga Hacking [place], PT, DPT [phone]   09-26-13, 3:54 PM

## 2013-09-21 NOTE — Consult Note (Signed)
Palliative Medicine Team Consult Note Requested by: FPTS  [name] [name] is well known to the Palliative Service. He was seen in March 2015 at which time goals of care were established to be in line with comfort, dignity and to allow for a natural death to occur given his frail and extremely debilitated condition. He has chronic aspiration for which there is no cure, recurrent PNA and sepsis. This morning he is febrile and hypotensive. He is mostly unresponsive and non-verbal with periods of agitation.  My strong recommendation is to transition [name] [name] to full comfort care as the most medically reasonable and compassionate choice given his acute decline and chronic debility. He would be appropriate at this time for a hospice facility where our focus can be on providing comfort, symptom management and dignity as he approaches end of life.  I have placed multiple phone calls and left messages with DSS Guardian contacts. Awaiting return call. He should remain a DNR as previously determined in March, but based on H&P and resident conversation with DSS guardian [name] [name]- her goals for him may have changed since he is a FULL CODE now. Would not advise CPR or intubation should he have an arrest which is an entirely separate issue than medical decision making and treatment options.  Recommendations:  1. Code Status should be changed to DNR as previously determined (will await consent from DSS Guardian) 2. Patient should be transitioned to full comfort care including a focus on pain and dyspnea management, comfort feeding and dignity. 3. Given his acute decline would be appropriate for a hospice facility.  Awaiting consent from Guardian. Also attempted to call on call DSS social worker but was placed on hold for >30 minutes at which time I disconnected the call.  Time: 10AM-11AM 60 minutes Greater than 50%  of this time was spent counseling and coordinating care related to the above assessment and  plan.   Anderson Malta, DO Palliative Medicine

## 2013-09-21 NOTE — Progress Notes (Signed)
I have seen and examined this patient. I have discussed with Dr Jaquita Rector.  I agree with their findings and plans as documented in their progress note.  [facility]MTS greatly appreciates the efforts and patient advocacy of the Palliative Care Service. I am in agreement that [name] [name] continues to aspirate with bare ear audible rhonchorous pulmonary sounds.   I agree with Palliative care that patient's advanced dementia is a contra-indication to tube feeding.  Careful hand feeding is the appropriate comfort approach to [name] [name] care.   While I am in agreement with the Palliative Care Services goals of comfort and dignity, the legal aspect of guardianship compels our service to provide therapies  beyond comfort.   I will hope that we are not forced by legal coercions to cause suffering to [name] [name] through aggressive medical interventions.  I look forward to an understanding that [name] [name] is in the process of dying.   Our service will be glad to be involved in discussions around [name] [name] goals of care and future management.

## 2013-09-21 NOTE — Progress Notes (Signed)
Call received from [facility] who gave instructions that patient should be DNR. Ms. [name] [name] reported that his code status was not changed to FULL Code upon admission. I shared with her the admission documentation. Order placed for DNR. Shortly after code status was changed [name] [name] expired. Fortunately he was not coded since I changed his status immediately in the chart and he did not suffer. I have notified [name] with DSS that [name] [name] died. She will notify his brother. DSS contact can be reached at [phone].   Anderson Malta, DO Palliative Medicine

## 2013-09-21 DEATH — deceased

## 2013-09-23 LAB — CULTURE, BLOOD (ROUTINE X 2)
CULTURE: NO GROWTH
Culture: NO GROWTH

## 2013-10-06 DIAGNOSIS — R6520 Severe sepsis without septic shock: Secondary | ICD-10-CM

## 2013-10-06 DIAGNOSIS — J9620 Acute and chronic respiratory failure, unspecified whether with hypoxia or hypercapnia: Secondary | ICD-10-CM

## 2013-10-06 DIAGNOSIS — A419 Sepsis, unspecified organism: Secondary | ICD-10-CM

## 2013-10-21 NOTE — Discharge Summary (Signed)
I discussed with  Dr Nadine Counts.  I agree with their plans documented in their death summary note.

## 2015-02-17 ENCOUNTER — Telehealth: Payer: Self-pay | Admitting: Family Medicine

## 2015-02-17 NOTE — Telephone Encounter (Signed)
Gerlean Ren is Production designer, theatre/television/film with [facility], group home where pt resided prior to his death. Gerlean Ren would like a copy of pt last office visit 10/12/13. Please call him at [phone] to let him know if we are able to fax it to him. Fax number is [phone]

## 2015-02-21 NOTE — Telephone Encounter (Signed)
Gerlean Ren called again about this notes.  He can come pick them up if needed

## 2015-02-21 NOTE — Telephone Encounter (Signed)
Fax number: [phone]. Facility is getting pressure by the State to provide documentation as to the last date pt was seen at this practice. All that is needed is a letter stating pt was seen here 09/12/2013 and later admitted. Please fax

## 2015-02-24 NOTE — Telephone Encounter (Signed)
Spoke to [place], I asked for documentation that we are authorized to release this information. He said he has an old ROI but nothing from the family. He will talk to his supervisor and call us back. They are needing this information to prove that the pt was at [facility] group home the week of the 18th and not admitted into the hospital until after 08/27/2013 visit. The State is not wanting to pay for housing the week of the 18th. Sunday Spillers, CMA
